# Patient Record
Sex: Female | Born: 1945 | Race: Black or African American | Hispanic: No | Marital: Married | Smoking: Former smoker
Health system: Southern US, Community
[De-identification: ages and names within clinical notes are randomized; demographics above are authoritative.]

## PROBLEM LIST (undated history)

## (undated) DIAGNOSIS — I2510 Atherosclerotic heart disease of native coronary artery without angina pectoris: Secondary | ICD-10-CM

## (undated) DIAGNOSIS — E119 Type 2 diabetes mellitus without complications: Secondary | ICD-10-CM

## (undated) DIAGNOSIS — D571 Sickle-cell disease without crisis: Secondary | ICD-10-CM

## (undated) DIAGNOSIS — N289 Disorder of kidney and ureter, unspecified: Secondary | ICD-10-CM

## (undated) DIAGNOSIS — I10 Essential (primary) hypertension: Secondary | ICD-10-CM

## (undated) DIAGNOSIS — H409 Unspecified glaucoma: Secondary | ICD-10-CM

## (undated) DIAGNOSIS — E039 Hypothyroidism, unspecified: Secondary | ICD-10-CM

## (undated) HISTORY — PX: RETINAL DETACHMENT SURGERY: SHX105

## (undated) HISTORY — PX: HIP SURGERY: SHX245

## (undated) HISTORY — PX: CHOLECYSTECTOMY: SHX55

## (undated) HISTORY — PX: REFRACTIVE SURGERY: SHX103

---

## 1998-05-02 ENCOUNTER — Other Ambulatory Visit: Admission: RE | Admit: 1998-05-02 | Discharge: 1998-05-02 | Payer: Self-pay | Admitting: Internal Medicine

## 1998-07-01 ENCOUNTER — Emergency Department (HOSPITAL_COMMUNITY): Admission: EM | Admit: 1998-07-01 | Discharge: 1998-07-01 | Payer: Self-pay | Admitting: Emergency Medicine

## 2000-08-05 ENCOUNTER — Encounter: Payer: Self-pay | Admitting: Ophthalmology

## 2000-08-06 ENCOUNTER — Ambulatory Visit (HOSPITAL_COMMUNITY): Admission: RE | Admit: 2000-08-06 | Discharge: 2000-08-06 | Payer: Self-pay | Admitting: Ophthalmology

## 2000-10-30 ENCOUNTER — Ambulatory Visit (HOSPITAL_COMMUNITY): Admission: RE | Admit: 2000-10-30 | Discharge: 2000-10-30 | Payer: Self-pay | Admitting: Ophthalmology

## 2001-12-12 ENCOUNTER — Emergency Department (HOSPITAL_COMMUNITY): Admission: EM | Admit: 2001-12-12 | Discharge: 2001-12-12 | Payer: Self-pay | Admitting: Emergency Medicine

## 2002-12-10 ENCOUNTER — Encounter: Payer: Self-pay | Admitting: Emergency Medicine

## 2002-12-10 ENCOUNTER — Emergency Department (HOSPITAL_COMMUNITY): Admission: EM | Admit: 2002-12-10 | Discharge: 2002-12-10 | Payer: Self-pay | Admitting: Emergency Medicine

## 2004-05-14 ENCOUNTER — Inpatient Hospital Stay (HOSPITAL_COMMUNITY): Admission: EM | Admit: 2004-05-14 | Discharge: 2004-05-21 | Payer: Self-pay | Admitting: Emergency Medicine

## 2004-08-16 ENCOUNTER — Encounter: Admission: RE | Admit: 2004-08-16 | Discharge: 2004-08-16 | Payer: Self-pay | Admitting: Pulmonary Disease

## 2004-08-27 ENCOUNTER — Ambulatory Visit (HOSPITAL_COMMUNITY): Admission: RE | Admit: 2004-08-27 | Discharge: 2004-08-27 | Payer: Self-pay | Admitting: Orthopedic Surgery

## 2004-09-12 ENCOUNTER — Inpatient Hospital Stay (HOSPITAL_BASED_OUTPATIENT_CLINIC_OR_DEPARTMENT_OTHER): Admission: RE | Admit: 2004-09-12 | Discharge: 2004-09-12 | Payer: Self-pay | Admitting: Cardiology

## 2004-12-31 ENCOUNTER — Inpatient Hospital Stay (HOSPITAL_COMMUNITY): Admission: RE | Admit: 2004-12-31 | Discharge: 2005-01-03 | Payer: Self-pay | Admitting: Orthopedic Surgery

## 2005-11-26 ENCOUNTER — Encounter: Admission: RE | Admit: 2005-11-26 | Discharge: 2005-11-26 | Payer: Self-pay | Admitting: Pulmonary Disease

## 2006-06-24 ENCOUNTER — Ambulatory Visit (HOSPITAL_COMMUNITY): Admission: RE | Admit: 2006-06-24 | Discharge: 2006-06-24 | Payer: Self-pay | Admitting: Orthopedic Surgery

## 2009-06-22 ENCOUNTER — Ambulatory Visit (HOSPITAL_COMMUNITY): Admission: RE | Admit: 2009-06-22 | Discharge: 2009-06-22 | Payer: Self-pay | Admitting: Ophthalmology

## 2009-07-24 ENCOUNTER — Encounter: Admission: RE | Admit: 2009-07-24 | Discharge: 2009-07-24 | Payer: Self-pay | Admitting: Pulmonary Disease

## 2009-07-27 ENCOUNTER — Encounter: Admission: RE | Admit: 2009-07-27 | Discharge: 2009-07-27 | Payer: Self-pay | Admitting: Pulmonary Disease

## 2010-06-06 ENCOUNTER — Ambulatory Visit (HOSPITAL_COMMUNITY): Admission: RE | Admit: 2010-06-06 | Discharge: 2010-06-06 | Payer: Self-pay | Admitting: Ophthalmology

## 2010-07-29 ENCOUNTER — Encounter: Admission: RE | Admit: 2010-07-29 | Discharge: 2010-07-29 | Payer: Self-pay | Admitting: Pulmonary Disease

## 2010-12-23 ENCOUNTER — Encounter: Payer: Self-pay | Admitting: Pulmonary Disease

## 2011-02-16 ENCOUNTER — Inpatient Hospital Stay (HOSPITAL_COMMUNITY)
Admission: EM | Admit: 2011-02-16 | Discharge: 2011-02-23 | DRG: 417 | Disposition: A | Discharge status: Home / Self Care | Payer: Medicare Other | Attending: Internal Medicine | Admitting: Internal Medicine

## 2011-02-16 ENCOUNTER — Emergency Department (HOSPITAL_COMMUNITY): Payer: Medicare Other

## 2011-02-16 DIAGNOSIS — R1011 Right upper quadrant pain: Secondary | ICD-10-CM

## 2011-02-16 DIAGNOSIS — I10 Essential (primary) hypertension: Secondary | ICD-10-CM | POA: Diagnosis present

## 2011-02-16 DIAGNOSIS — E039 Hypothyroidism, unspecified: Secondary | ICD-10-CM | POA: Diagnosis present

## 2011-02-16 DIAGNOSIS — E119 Type 2 diabetes mellitus without complications: Secondary | ICD-10-CM | POA: Diagnosis present

## 2011-02-16 DIAGNOSIS — E785 Hyperlipidemia, unspecified: Secondary | ICD-10-CM | POA: Diagnosis present

## 2011-02-16 DIAGNOSIS — K5900 Constipation, unspecified: Secondary | ICD-10-CM | POA: Diagnosis not present

## 2011-02-16 DIAGNOSIS — R7402 Elevation of levels of lactic acid dehydrogenase (LDH): Secondary | ICD-10-CM | POA: Diagnosis present

## 2011-02-16 DIAGNOSIS — D5700 Hb-SS disease with crisis, unspecified: Secondary | ICD-10-CM | POA: Diagnosis present

## 2011-02-16 DIAGNOSIS — I252 Old myocardial infarction: Secondary | ICD-10-CM

## 2011-02-16 DIAGNOSIS — R7401 Elevation of levels of liver transaminase levels: Secondary | ICD-10-CM | POA: Diagnosis present

## 2011-02-16 DIAGNOSIS — R0902 Hypoxemia: Secondary | ICD-10-CM | POA: Diagnosis not present

## 2011-02-16 DIAGNOSIS — K8010 Calculus of gallbladder with chronic cholecystitis without obstruction: Principal | ICD-10-CM | POA: Diagnosis present

## 2011-02-16 LAB — CBC
HCT: 32.4 % — ABNORMAL LOW (ref 36.0–46.0)
Hemoglobin: 11.4 g/dL — ABNORMAL LOW (ref 12.0–15.0)
MCH: 29.1 pg (ref 26.0–34.0)
MCHC: 35.2 g/dL (ref 30.0–36.0)
MCV: 82.7 fL (ref 78.0–100.0)
Platelets: 226 10*3/uL (ref 150–400)
RBC: 3.92 MIL/uL (ref 3.87–5.11)
RDW: 16.7 % — ABNORMAL HIGH (ref 11.5–15.5)
WBC: 14 10*3/uL — ABNORMAL HIGH (ref 4.0–10.5)

## 2011-02-16 LAB — URINALYSIS, ROUTINE W REFLEX MICROSCOPIC
Bilirubin Urine: NEGATIVE
Glucose, UA: NEGATIVE mg/dL
Ketones, ur: 15 mg/dL — AB
Nitrite: NEGATIVE
Protein, ur: 30 mg/dL — AB
Specific Gravity, Urine: 1.013 (ref 1.005–1.030)
Urobilinogen, UA: 1 mg/dL (ref 0.0–1.0)
pH: 5.5 (ref 5.0–8.0)

## 2011-02-16 LAB — DIFFERENTIAL
Basophils Absolute: 0 10*3/uL (ref 0.0–0.1)
Basophils Relative: 0 % (ref 0–1)
Eosinophils Absolute: 0 10*3/uL (ref 0.0–0.7)
Eosinophils Relative: 0 % (ref 0–5)
Lymphocytes Relative: 11 % — ABNORMAL LOW (ref 12–46)
Lymphs Abs: 1.5 10*3/uL (ref 0.7–4.0)
Monocytes Absolute: 0.5 10*3/uL (ref 0.1–1.0)
Monocytes Relative: 4 % (ref 3–12)
Neutro Abs: 12 10*3/uL — ABNORMAL HIGH (ref 1.7–7.7)
Neutrophils Relative %: 85 % — ABNORMAL HIGH (ref 43–77)

## 2011-02-16 LAB — COMPREHENSIVE METABOLIC PANEL
ALT: 318 U/L — ABNORMAL HIGH (ref 0–35)
AST: 280 U/L — ABNORMAL HIGH (ref 0–37)
Albumin: 4.8 g/dL (ref 3.5–5.2)
Alkaline Phosphatase: 83 U/L (ref 39–117)
BUN: 16 mg/dL (ref 6–23)
CO2: 24 mEq/L (ref 19–32)
Calcium: 9.7 mg/dL (ref 8.4–10.5)
Chloride: 105 mEq/L (ref 96–112)
Creatinine, Ser: 0.91 mg/dL (ref 0.4–1.2)
GFR calc Af Amer: >60 mL/min (ref >60)
GFR calc non Af Amer: >60 mL/min (ref >60)
Glucose, Bld: 133 mg/dL — ABNORMAL HIGH (ref 70–99)
Potassium: 3.9 mEq/L (ref 3.5–5.1)
Sodium: 140 mEq/L (ref 135–145)
Total Bilirubin: 1.7 mg/dL — ABNORMAL HIGH (ref 0.3–1.2)
Total Protein: 8.2 g/dL (ref 6.0–8.3)

## 2011-02-16 LAB — URINE MICROSCOPIC-ADD ON

## 2011-02-16 LAB — POCT CARDIAC MARKERS
CKMB, poc: <1 ng/mL — ABNORMAL LOW (ref 1.0–8.0)
Myoglobin, poc: 94.6 ng/mL (ref 12–200)
Troponin i, poc: <0.05 ng/mL (ref 0.00–0.09)

## 2011-02-16 LAB — LIPASE, BLOOD: Lipase: 26 U/L (ref 11–59)

## 2011-02-16 LAB — GLUCOSE, CAPILLARY: Glucose-Capillary: 138 mg/dL — ABNORMAL HIGH (ref 70–99)

## 2011-02-17 ENCOUNTER — Encounter (HOSPITAL_COMMUNITY): Payer: Self-pay | Admitting: Radiology

## 2011-02-17 ENCOUNTER — Observation Stay (HOSPITAL_COMMUNITY): Payer: Medicare Other

## 2011-02-17 LAB — COMPREHENSIVE METABOLIC PANEL
ALT: 265 U/L — ABNORMAL HIGH (ref 0–35)
AST: 189 U/L — ABNORMAL HIGH (ref 0–37)
Albumin: 4.1 g/dL (ref 3.5–5.2)
Alkaline Phosphatase: 101 U/L (ref 39–117)
BUN: 13 mg/dL (ref 6–23)
CO2: 26 mEq/L (ref 19–32)
Calcium: 9.4 mg/dL (ref 8.4–10.5)
Chloride: 106 mEq/L (ref 96–112)
Creatinine, Ser: 0.97 mg/dL (ref 0.4–1.2)
GFR calc Af Amer: >60 mL/min (ref >60)
GFR calc non Af Amer: 58 mL/min — ABNORMAL LOW (ref >60)
Glucose, Bld: 246 mg/dL — ABNORMAL HIGH (ref 70–99)
Potassium: 4.2 mEq/L (ref 3.5–5.1)
Sodium: 139 mEq/L (ref 135–145)
Total Bilirubin: 1.2 mg/dL (ref 0.3–1.2)
Total Protein: 7.7 g/dL (ref 6.0–8.3)

## 2011-02-17 LAB — CBC
HCT: 30.3 % — ABNORMAL LOW (ref 36.0–46.0)
Hemoglobin: 10.7 g/dL — ABNORMAL LOW (ref 12.0–15.0)
MCH: 29.1 pg (ref 26.0–34.0)
MCHC: 35.3 g/dL (ref 30.0–36.0)
MCV: 82.3 fL (ref 78.0–100.0)
Platelets: 183 10*3/uL (ref 150–400)
RBC: 3.68 MIL/uL — ABNORMAL LOW (ref 3.87–5.11)
RDW: 16.5 % — ABNORMAL HIGH (ref 11.5–15.5)
WBC: 15.4 10*3/uL — ABNORMAL HIGH (ref 4.0–10.5)

## 2011-02-17 LAB — GLUCOSE, CAPILLARY
Glucose-Capillary: 207 mg/dL — ABNORMAL HIGH (ref 70–99)
Glucose-Capillary: 229 mg/dL — ABNORMAL HIGH (ref 70–99)
Glucose-Capillary: 217 mg/dL — ABNORMAL HIGH (ref 70–99)

## 2011-02-17 LAB — CARDIAC PANEL(CRET KIN+CKTOT+MB+TROPI)
CK, MB: 1.3 ng/mL (ref 0.3–4.0)
CK, MB: 1.3 ng/mL (ref 0.3–4.0)
CK, MB: 1.4 ng/mL (ref 0.3–4.0)
Relative Index: INVALID (ref 0.0–2.5)
Relative Index: INVALID (ref 0.0–2.5)
Relative Index: INVALID (ref 0.0–2.5)
Total CK: 55 U/L (ref 7–177)
Total CK: 50 U/L (ref 7–177)
Total CK: 50 U/L (ref 7–177)
Troponin I: 0.01 ng/mL (ref 0.00–0.06)
Troponin I: 0.01 ng/mL (ref 0.00–0.06)
Troponin I: 0.01 ng/mL (ref 0.00–0.06)

## 2011-02-17 LAB — URINE CULTURE: Culture  Setup Time: 201203182353

## 2011-02-17 LAB — TSH: TSH: 1.591 u[IU]/mL (ref 0.350–4.500)

## 2011-02-17 MED ORDER — TECHNETIUM TC 99M MEBROFENIN IV KIT
4.9000 | PACK | Freq: Once | INTRAVENOUS | Status: AC | PRN
  Administered 2011-02-17: 4.9 via INTRAVENOUS

## 2011-02-18 LAB — GLUCOSE, CAPILLARY
Glucose-Capillary: 184 mg/dL — ABNORMAL HIGH (ref 70–99)
Glucose-Capillary: 186 mg/dL — ABNORMAL HIGH (ref 70–99)
Glucose-Capillary: 244 mg/dL — ABNORMAL HIGH (ref 70–99)
Glucose-Capillary: 233 mg/dL — ABNORMAL HIGH (ref 70–99)
Glucose-Capillary: 167 mg/dL — ABNORMAL HIGH (ref 70–99)

## 2011-02-18 LAB — COMPREHENSIVE METABOLIC PANEL
ALT: 222 U/L — ABNORMAL HIGH (ref 0–35)
AST: 112 U/L — ABNORMAL HIGH (ref 0–37)
Albumin: 3.7 g/dL (ref 3.5–5.2)
Alkaline Phosphatase: 122 U/L — ABNORMAL HIGH (ref 39–117)
BUN: 10 mg/dL (ref 6–23)
CO2: 27 mEq/L (ref 19–32)
Calcium: 8.9 mg/dL (ref 8.4–10.5)
Chloride: 103 mEq/L (ref 96–112)
Creatinine, Ser: 0.92 mg/dL (ref 0.4–1.2)
GFR calc Af Amer: >60 mL/min (ref >60)
GFR calc non Af Amer: >60 mL/min (ref >60)
Glucose, Bld: 176 mg/dL — ABNORMAL HIGH (ref 70–99)
Potassium: 4 mEq/L (ref 3.5–5.1)
Sodium: 136 mEq/L (ref 135–145)
Total Bilirubin: 1.4 mg/dL — ABNORMAL HIGH (ref 0.3–1.2)
Total Protein: 7 g/dL (ref 6.0–8.3)

## 2011-02-18 LAB — CBC
HCT: 31.1 % — ABNORMAL LOW (ref 36.0–46.0)
Hemoglobin: 10.8 g/dL — ABNORMAL LOW (ref 12.0–15.0)
MCH: 28.8 pg (ref 26.0–34.0)
MCHC: 34.7 g/dL (ref 30.0–36.0)
MCV: 82.9 fL (ref 78.0–100.0)
Platelets: 172 10*3/uL (ref 150–400)
RBC: 3.75 MIL/uL — ABNORMAL LOW (ref 3.87–5.11)
RDW: 16.7 % — ABNORMAL HIGH (ref 11.5–15.5)
WBC: 16.2 10*3/uL — ABNORMAL HIGH (ref 4.0–10.5)

## 2011-02-18 LAB — RETICULOCYTES
RBC.: 3.75 MIL/uL — ABNORMAL LOW (ref 3.87–5.11)
Retic Count, Absolute: 176.3 10*3/uL (ref 19.0–186.0)
Retic Ct Pct: 4.7 % — ABNORMAL HIGH (ref 0.4–3.1)

## 2011-02-19 ENCOUNTER — Other Ambulatory Visit: Payer: Self-pay | Admitting: Emergency Medicine

## 2011-02-19 ENCOUNTER — Observation Stay (HOSPITAL_COMMUNITY): Payer: Medicare Other

## 2011-02-19 LAB — COMPREHENSIVE METABOLIC PANEL
ALT: 141 U/L — ABNORMAL HIGH (ref 0–35)
AST: 51 U/L — ABNORMAL HIGH (ref 0–37)
Albumin: 3.4 g/dL — ABNORMAL LOW (ref 3.5–5.2)
Alkaline Phosphatase: 104 U/L (ref 39–117)
BUN: 9 mg/dL (ref 6–23)
CO2: 27 mEq/L (ref 19–32)
Calcium: 8.5 mg/dL (ref 8.4–10.5)
Chloride: 102 mEq/L (ref 96–112)
Creatinine, Ser: 0.98 mg/dL (ref 0.4–1.2)
GFR calc Af Amer: >60 mL/min (ref >60)
GFR calc non Af Amer: 58 mL/min — ABNORMAL LOW (ref >60)
Glucose, Bld: 179 mg/dL — ABNORMAL HIGH (ref 70–99)
Potassium: 3.7 mEq/L (ref 3.5–5.1)
Sodium: 136 mEq/L (ref 135–145)
Total Bilirubin: 1.5 mg/dL — ABNORMAL HIGH (ref 0.3–1.2)
Total Protein: 6.5 g/dL (ref 6.0–8.3)

## 2011-02-19 LAB — GLUCOSE, CAPILLARY
Glucose-Capillary: 183 mg/dL — ABNORMAL HIGH (ref 70–99)
Glucose-Capillary: 214 mg/dL — ABNORMAL HIGH (ref 70–99)
Glucose-Capillary: 201 mg/dL — ABNORMAL HIGH (ref 70–99)

## 2011-02-19 LAB — HEPATITIS PANEL, ACUTE
HCV Ab: NEGATIVE
Hep A IgM: NEGATIVE
Hep B C IgM: NEGATIVE
Hepatitis B Surface Ag: NEGATIVE

## 2011-02-19 LAB — CBC
HCT: 29.3 % — ABNORMAL LOW (ref 36.0–46.0)
Hemoglobin: 10.1 g/dL — ABNORMAL LOW (ref 12.0–15.0)
MCH: 28.4 pg (ref 26.0–34.0)
MCHC: 34.5 g/dL (ref 30.0–36.0)
MCV: 82.3 fL (ref 78.0–100.0)
Platelets: 149 10*3/uL — ABNORMAL LOW (ref 150–400)
RBC: 3.56 MIL/uL — ABNORMAL LOW (ref 3.87–5.11)
RDW: 16.7 % — ABNORMAL HIGH (ref 11.5–15.5)
WBC: 15 10*3/uL — ABNORMAL HIGH (ref 4.0–10.5)

## 2011-02-20 LAB — HEMOGLOBIN A1C
Hgb A1c MFr Bld: 4.5 % — ABNORMAL LOW (ref <5.7)
Mean Plasma Glucose: 82 mg/dL (ref <117)

## 2011-02-20 LAB — DIFFERENTIAL
Basophils Absolute: 0 10*3/uL (ref 0.0–0.1)
Basophils Relative: 0 % (ref 0–1)
Eosinophils Absolute: 0.1 10*3/uL (ref 0.0–0.7)
Eosinophils Relative: 1 % (ref 0–5)
Lymphocytes Relative: 17 % (ref 12–46)
Lymphs Abs: 2 10*3/uL (ref 0.7–4.0)
Monocytes Absolute: 1.3 10*3/uL — ABNORMAL HIGH (ref 0.1–1.0)
Monocytes Relative: 11 % (ref 3–12)
Neutro Abs: 8.1 10*3/uL — ABNORMAL HIGH (ref 1.7–7.7)
Neutrophils Relative %: 70 % (ref 43–77)

## 2011-02-20 LAB — CBC
HCT: 28.3 % — ABNORMAL LOW (ref 36.0–46.0)
Hemoglobin: 9.7 g/dL — ABNORMAL LOW (ref 12.0–15.0)
MCH: 28.2 pg (ref 26.0–34.0)
MCHC: 34.3 g/dL (ref 30.0–36.0)
MCV: 82.3 fL (ref 78.0–100.0)
Platelets: 152 10*3/uL (ref 150–400)
RBC: 3.44 MIL/uL — ABNORMAL LOW (ref 3.87–5.11)
RDW: 16.7 % — ABNORMAL HIGH (ref 11.5–15.5)
WBC: 11.5 10*3/uL — ABNORMAL HIGH (ref 4.0–10.5)

## 2011-02-20 LAB — GLUCOSE, CAPILLARY
Glucose-Capillary: 153 mg/dL — ABNORMAL HIGH (ref 70–99)
Glucose-Capillary: 131 mg/dL — ABNORMAL HIGH (ref 70–99)
Glucose-Capillary: 126 mg/dL — ABNORMAL HIGH (ref 70–99)
Glucose-Capillary: 261 mg/dL — ABNORMAL HIGH (ref 70–99)
Glucose-Capillary: 124 mg/dL — ABNORMAL HIGH (ref 70–99)
Glucose-Capillary: 167 mg/dL — ABNORMAL HIGH (ref 70–99)

## 2011-02-21 ENCOUNTER — Inpatient Hospital Stay (HOSPITAL_COMMUNITY): Payer: Medicare Other

## 2011-02-21 LAB — COMPREHENSIVE METABOLIC PANEL
ALT: 123 U/L — ABNORMAL HIGH (ref 0–35)
AST: 67 U/L — ABNORMAL HIGH (ref 0–37)
Albumin: 3.1 g/dL — ABNORMAL LOW (ref 3.5–5.2)
Alkaline Phosphatase: 105 U/L (ref 39–117)
BUN: 7 mg/dL (ref 6–23)
CO2: 27 mEq/L (ref 19–32)
Calcium: 8.3 mg/dL — ABNORMAL LOW (ref 8.4–10.5)
Chloride: 105 mEq/L (ref 96–112)
Creatinine, Ser: 0.92 mg/dL (ref 0.4–1.2)
GFR calc Af Amer: >60 mL/min (ref >60)
GFR calc non Af Amer: >60 mL/min (ref >60)
Glucose, Bld: 137 mg/dL — ABNORMAL HIGH (ref 70–99)
Potassium: 3.7 mEq/L (ref 3.5–5.1)
Sodium: 140 mEq/L (ref 135–145)
Total Bilirubin: 1.9 mg/dL — ABNORMAL HIGH (ref 0.3–1.2)
Total Protein: 6.4 g/dL (ref 6.0–8.3)

## 2011-02-21 LAB — URINALYSIS, ROUTINE W REFLEX MICROSCOPIC
Bilirubin Urine: NEGATIVE
Glucose, UA: NEGATIVE mg/dL
Hgb urine dipstick: NEGATIVE
Ketones, ur: NEGATIVE mg/dL
Nitrite: NEGATIVE
Protein, ur: NEGATIVE mg/dL
Specific Gravity, Urine: 1.009 (ref 1.005–1.030)
Urobilinogen, UA: 1 mg/dL (ref 0.0–1.0)
pH: 6 (ref 5.0–8.0)

## 2011-02-21 LAB — GLUCOSE, CAPILLARY
Glucose-Capillary: 133 mg/dL — ABNORMAL HIGH (ref 70–99)
Glucose-Capillary: 225 mg/dL — ABNORMAL HIGH (ref 70–99)
Glucose-Capillary: 152 mg/dL — ABNORMAL HIGH (ref 70–99)

## 2011-02-21 LAB — CBC
HCT: 26.9 % — ABNORMAL LOW (ref 36.0–46.0)
Hemoglobin: 9.2 g/dL — ABNORMAL LOW (ref 12.0–15.0)
MCH: 28.6 pg (ref 26.0–34.0)
MCHC: 34.2 g/dL (ref 30.0–36.0)
MCV: 83.5 fL (ref 78.0–100.0)
Platelets: 155 10*3/uL (ref 150–400)
RBC: 3.22 MIL/uL — ABNORMAL LOW (ref 3.87–5.11)
RDW: 17.1 % — ABNORMAL HIGH (ref 11.5–15.5)
WBC: 13.9 10*3/uL — ABNORMAL HIGH (ref 4.0–10.5)

## 2011-02-21 LAB — DIFFERENTIAL
Basophils Absolute: 0 10*3/uL (ref 0.0–0.1)
Basophils Relative: 0 % (ref 0–1)
Eosinophils Absolute: 0.3 10*3/uL (ref 0.0–0.7)
Eosinophils Relative: 2 % (ref 0–5)
Lymphocytes Relative: 23 % (ref 12–46)
Lymphs Abs: 3.1 10*3/uL (ref 0.7–4.0)
Monocytes Absolute: 1.5 10*3/uL — ABNORMAL HIGH (ref 0.1–1.0)
Monocytes Relative: 11 % (ref 3–12)
Neutro Abs: 9 10*3/uL — ABNORMAL HIGH (ref 1.7–7.7)
Neutrophils Relative %: 64 % (ref 43–77)

## 2011-02-22 LAB — GLUCOSE, CAPILLARY
Glucose-Capillary: 148 mg/dL — ABNORMAL HIGH (ref 70–99)
Glucose-Capillary: 130 mg/dL — ABNORMAL HIGH (ref 70–99)

## 2011-02-22 LAB — CBC
HCT: 26.6 % — ABNORMAL LOW (ref 36.0–46.0)
Hemoglobin: 9 g/dL — ABNORMAL LOW (ref 12.0–15.0)
MCH: 28 pg (ref 26.0–34.0)
MCHC: 33.8 g/dL (ref 30.0–36.0)
MCV: 82.6 fL (ref 78.0–100.0)
Platelets: ADEQUATE 10*3/uL (ref 150–400)
RBC: 3.22 MIL/uL — ABNORMAL LOW (ref 3.87–5.11)
RDW: 16.9 % — ABNORMAL HIGH (ref 11.5–15.5)
WBC: 14 10*3/uL — ABNORMAL HIGH (ref 4.0–10.5)

## 2011-02-22 LAB — TSH: TSH: 0.814 u[IU]/mL (ref 0.350–4.500)

## 2011-02-22 LAB — DIFFERENTIAL
Basophils Absolute: 0 10*3/uL (ref 0.0–0.1)
Basophils Relative: 0 % (ref 0–1)
Eosinophils Absolute: 0.3 10*3/uL (ref 0.0–0.7)
Eosinophils Relative: 2 % (ref 0–5)
Lymphocytes Relative: 19 % (ref 12–46)
Lymphs Abs: 2.7 10*3/uL (ref 0.7–4.0)
Monocytes Absolute: 1.4 10*3/uL — ABNORMAL HIGH (ref 0.1–1.0)
Monocytes Relative: 10 % (ref 3–12)
Neutro Abs: 9.6 10*3/uL — ABNORMAL HIGH (ref 1.7–7.7)
Neutrophils Relative %: 69 % (ref 43–77)

## 2011-02-23 LAB — DIFFERENTIAL
Basophils Absolute: 0 10*3/uL (ref 0.0–0.1)
Basophils Relative: 0 % (ref 0–1)
Eosinophils Absolute: 0.2 10*3/uL (ref 0.0–0.7)
Eosinophils Relative: 2 % (ref 0–5)
Lymphocytes Relative: 23 % (ref 12–46)
Lymphs Abs: 2.6 10*3/uL (ref 0.7–4.0)
Monocytes Absolute: 1 10*3/uL (ref 0.1–1.0)
Monocytes Relative: 9 % (ref 3–12)
Neutro Abs: 7.4 10*3/uL (ref 1.7–7.7)
Neutrophils Relative %: 66 % (ref 43–77)

## 2011-02-23 LAB — GLUCOSE, CAPILLARY: Glucose-Capillary: 146 mg/dL — ABNORMAL HIGH (ref 70–99)

## 2011-02-23 LAB — CBC
HCT: 25.5 % — ABNORMAL LOW (ref 36.0–46.0)
Hemoglobin: 8.7 g/dL — ABNORMAL LOW (ref 12.0–15.0)
MCH: 28.4 pg (ref 26.0–34.0)
MCHC: 34.1 g/dL (ref 30.0–36.0)
MCV: 83.3 fL (ref 78.0–100.0)
Platelets: 187 10*3/uL (ref 150–400)
RBC: 3.06 MIL/uL — ABNORMAL LOW (ref 3.87–5.11)
RDW: 17.1 % — ABNORMAL HIGH (ref 11.5–15.5)
WBC: 11.2 10*3/uL — ABNORMAL HIGH (ref 4.0–10.5)

## 2011-02-27 LAB — GLUCOSE, CAPILLARY
Glucose-Capillary: 152 mg/dL — ABNORMAL HIGH (ref 70–99)
Glucose-Capillary: 114 mg/dL — ABNORMAL HIGH (ref 70–99)
Glucose-Capillary: 163 mg/dL — ABNORMAL HIGH (ref 70–99)
Glucose-Capillary: 200 mg/dL — ABNORMAL HIGH (ref 70–99)

## 2011-03-02 NOTE — H&P (Signed)
NAMEANIYA, MILFORD                 ACCOUNT NO.:  [id number]  MEDICAL RECORD NO.:  [id number]           PATIENT TYPE:  E  LOCATION:  WLED                         FACILITY:  [facility]  PHYSICIAN:  Geraldine Solar, MD     DATE OF BIRTH:  03/03/1946  DATE OF ADMISSION:  02/16/2011 DATE OF DISCHARGE:                             HISTORY & PHYSICAL   PRIMARY CARE PHYSICIAN:  Ernestene Kiel, M.D.  CODE STATUS:  The patient is a full code.  HISTORY OF PRESENT ILLNESS:  Ms. Kumba Diego is a 65 year old African American pleasant woman with history of sickle cell disease and other comorbidities as outlined below, presented to the ER today with chief complaint of chest pain, joints pain including upper and lower extremities joints, back pain similar to her previous sickle cell crisis.  She was also complaining of nonspecific abdominal pain, which is currently resolved.  She vomited twice and feeling nauseated.  Denies any diarrhea, constipation, or any change in her bowel movement.  The patient described her pain as dull aching and sometimes sharp pain rated at 10/10 on presentation, currently improved after she took pain medicine in the ER, now around 4 to 5 out of 10, diffuse pain including all her joints, back, and chest.  PAST MEDICAL HISTORY: 1. Sickle cell disease/anemia. 2. Hypertension. 3. Diabetes mellitus, type 2. 4. History of MI/coronary artery disease. 5. Degenerative joint disease. 6. Bone graft, left femoral head for aseptic necrosis in 2006. 7. History of angina. 8. Hyperlipidemia. 9. Hypothyroidism.  SOCIAL HISTORY:  Lives with husband.  Denies smoking, drinking, or illicit drug use.  ALLERGIES:  No known drug allergies.  HOME MEDICATIONS: 1. Altace 10 mg twice daily. 2. Synthroid 75 mcg once daily. 3. Azor 10/40 mg once daily. 4. Lipitor 40 mg once daily. 5. Glucotrol XL 5 mg once daily. 6. Glucophage XR 500 mg twice daily. 7. Folic acid 1 mg once daily. 8.  Aspirin 81 mg once daily. 9. Calcium tablets once a day. 10.Celebrex 200 mg twice daily.  REVIEW OF SYSTEMS:  As above in HPI.  Complaining of chest pain.  Denies shortness of breath or cough.  CARDIOVASCULAR:  Chest pain.  No palpitation.  No dizziness.  ABDOMEN:  Significant for abdominal pain, which currently resolved associated with nausea and vomiting x2.  No change in bowel habits.  GU:  Denies any dysuria, flank pain, hematuria, or increasing urinary frequency.  CONSTITUTIONAL:  Denies any fever or chills.  FAMILY HISTORY:  No family history of sickle cell disease/anemia in her sibling or her family from both sides.  PHYSICAL EXAMINATION:  VITAL SIGNS:  Blood pressure 181/72, pulse 90, temperature 99, O2 sat 96% on oxygen. GENERAL:  She is alert, in moderate distress. NECK:  Supple.  No JVD. LUNGS:  Clear to auscultation bilaterally. CARDIOVASCULAR:  S1, S2, regular rhythm and rate. ABDOMEN:  Soft, nontender.  Bowel sounds normal. EXTREMITIES:  Show no pedal edema.  LABORATORY DATA:  Urinalysis showed small leukocytes, 3-6 wbc's, negative nitrite.  Cardiac enzymes, troponin is less than 0.05, lipase 26.  Potassium 3.9, glucose 133, creatinine 0.9,  bilirubin total 1.7, AST 280, ALT 318, albumin 4.8, calcium 9.7.  CBC, WBCs 14, hemoglobin 11.4, hematocrit 32.4, platelet count 226, neutrophil percent 85%.  RADIOLOGY:  Abdominal sonogram showed a wall-echo-shadow complex. Differential consideration include a stone-filled contracted gallbladder or calcified gallbladder wall.  No evidence of acute cholecystitis.  No biliary ductal dilatation.  Probable hepatic steatosis.  Chest x-ray showed no active cardiopulmonary disease.  ASSESSMENT/PLAN:  Ms. Cathy Ravert is a 65 year old woman with history of sickle cell disease presenting with sickle cell crisis and transaminitis. 1. Sickle cell crisis.  We will start the patient on IV fluid for     hydration.  Pain control.  Continue  with folic acid.  Check LDH and     retic count. 2. Transaminitis with no evidence of acute cholecystitis as per     sonogram as above.  We will order for a HIDA scan. 3. Diabetes mellitus, type 2.  Given the poor oral intake, I will hold     her oral hypoglycemic agents and place her on sliding scale for     now. 4. Hypertension, uncontrolled.  Continue home medications and adjust     for optimal control. 5. Hypothyroidism.  Continue with Synthroid.  Check TSH level. 6. Deep vein thrombosis and gastrointestinal prophylaxis. 7. The patient is a full code.          ______________________________ Geraldine Solar, MD     SA/MEDQ  D:  02/16/2011  T:  02/16/2011  Job:  [id number]  cc:   Ernestene Kiel, M.D. [phone]  Electronically Signed by Shellee Milo MD on 03/02/2011 08:44:31 PM

## 2011-03-09 LAB — URINE MICROSCOPIC-ADD ON

## 2011-03-09 LAB — CBC
HCT: 32.4 % — ABNORMAL LOW (ref 36.0–46.0)
Hemoglobin: 10.9 g/dL — ABNORMAL LOW (ref 12.0–15.0)
MCHC: 33.7 g/dL (ref 30.0–36.0)
MCV: 89.5 fL (ref 78.0–100.0)
Platelets: 307 10*3/uL (ref 150–400)
RBC: 3.63 MIL/uL — ABNORMAL LOW (ref 3.87–5.11)
RDW: 17.7 % — ABNORMAL HIGH (ref 11.5–15.5)
WBC: 10.1 10*3/uL (ref 4.0–10.5)

## 2011-03-09 LAB — URINALYSIS, ROUTINE W REFLEX MICROSCOPIC
Bilirubin Urine: NEGATIVE
Glucose, UA: NEGATIVE mg/dL
Hgb urine dipstick: NEGATIVE
Ketones, ur: NEGATIVE mg/dL
Nitrite: NEGATIVE
Protein, ur: NEGATIVE mg/dL
Specific Gravity, Urine: 1.013 (ref 1.005–1.030)
Urobilinogen, UA: 1 mg/dL (ref 0.0–1.0)
pH: 6 (ref 5.0–8.0)

## 2011-03-09 LAB — BASIC METABOLIC PANEL
BUN: 13 mg/dL (ref 6–23)
CO2: 26 mEq/L (ref 19–32)
Calcium: 9.9 mg/dL (ref 8.4–10.5)
Chloride: 108 mEq/L (ref 96–112)
Creatinine, Ser: 0.99 mg/dL (ref 0.4–1.2)
GFR calc Af Amer: >60 mL/min (ref >60)
GFR calc non Af Amer: 57 mL/min — ABNORMAL LOW (ref >60)
Glucose, Bld: 96 mg/dL (ref 70–99)
Potassium: 5.1 mEq/L (ref 3.5–5.1)
Sodium: 143 mEq/L (ref 135–145)

## 2011-03-09 LAB — GLUCOSE, CAPILLARY: Glucose-Capillary: 97 mg/dL (ref 70–99)

## 2011-03-11 NOTE — Discharge Summary (Signed)
[name], [name]                 ACCOUNT NO.:  [id number]  MEDICAL RECORD NO.:  [id number]           PATIENT TYPE:  I  LOCATION:  1401                         FACILITY:  [place]:  Leana Gamer, MDDATE OF BIRTH:  June 05, 1946  DATE OF ADMISSION:  02/16/2011 DATE OF DISCHARGE:  02/23/2011                              DISCHARGE SUMMARY   DISCHARGE DISPOSITION:  Home.  FINAL DISCHARGE DIAGNOSES: 1. Acute cholecystitis, status post laparoscopic cholecystectomy. 2. Hemoglobin [place] with acute vasoocclusive episode, now resolved. 3. Leukocytosis secondary to demargination, resolved. 4. Hypoxemia, resolved. 5. 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 6. Diabetes type 2. 7. Constipation, resolved. 8. Hypertension, well controlled. 9. History of myocardial infarction/coronary artery disease, stable. 10.Status post bone graft, left femoral head, aseptic necrosis in     2006. 11.Hyperlipidemia. 12.Hypothyroidism.  Discharge medications include the following: 1. Vicodin 5/325 mg 1-2 tablets p.o. q.4 h. p.r.n. pain. 2. MiraLax 17 g in 8 ounces of fluid daily as needed for constipation. 3. Altace 10 mg p.o. b.i.d. 4. Aspirin 81 mg p.o. daily. 5. Azor 10/40 mg p.o. daily. 6. Timolol ophthalmic solution 1 drop in each eye twice a day. 7. Celebrex 200 mg p.o. b.i.d. 8. Folic acid 1 mg p.o. daily. 9. Glucotrol XL 5 mg p.o. daily. 10.Lantus 12 units subcutaneously nightly. 11.Lipitor 40 mg p.o. daily. 12.Metformin XR 500 mg p.o. b.i.d. 13.Restasis ophthalmic solution 1 drop each eye daily at bedtime. 14.Synthroid 75 mcg p.o. daily. 15.Travatan eyed rops 1 drop each eye b.i.d.  CONSULTANTS:  Amber L. Zenia Resides, MD, General Surgery.  PROCEDURES:  Laparoscopic cholecystectomy with cholangiogram intraoperatively done on February 19, 2011.  DIAGNOSTIC STUDIES: 1. Two-view chest x-ray on admission, which shows no active     cardiopulmonary disease. 2. Ultrasound of the  abdomen, which shows no evidence of acute     cholecystitis, no biliary ductal dilatation.  Impression:  Probable     hepatic steatosis. 3. HIDA scan, which showed delayed filling of the gallbladder after     morphine sulfate injection and indicating patency of the cystic     duct.  The gallbladder is small, suggesting chronic cholecystitis.     Common bile duct patent. 4. Two-view chest x-ray on the 23rd, which shows low lung volumes with     bibasilar atelectasis with small effusions.  PRIMARY CARE PHYSICIAN:  Ernestene Kiel, M.D.  CHIEF COMPLAINT:  Chest pain, joint pain.  HISTORY OF PRESENT ILLNESS:  Please refer to the H and P by Dr. Melrose Nakayama for details of the HPI; however, in short this is a 65 year old female with a history of hemoglobin [place] who presents to the emergency with complaints of polymyalgia.  This pain was consistent with her vasoocclusive episodes associated with hemoglobin [place].  The patient also complained of right upper quadrant pain and referred to [facility] for further evaluation and management. 1. Chronic subacute cholecystitis.  The patient was evaluated by     General Surgery with studies as above.  The patient was taken to     surgery and laparoscopic cholecystectomy performed.  The patient  tolerated the procedure well.  She was started on clear liquids and     advanced to a diabetic diet, which she is tolerating without any     difficulty. 2. Vasoocclusive episode associated with hemoglobin [place].  The patient     has rare vasoocclusive episode; however, this was likely was     precipitated by her acute condition involving her gallbladder.  The     patient postsurgically required some narcotics to control her pain;     however, the patient quickly weaned off her narcotics and has     required very minimal amounts of pain medication at this time. 3. Hypoxemia.  The patient had some postoperative hypoxemia, which was     felt to be associated  was probably narcotic use in addition to some     fluid overload.  The patient has been weaned off her oxygen and was     doing well.  Her sats are  between 93% and 95%, ambulating on room     air. 4. Diabetes type 2.  The patient has good control on her diabetes.     She is resumed on her Lantus, Glucophage, and Glucotrol, which she     is tolerated well. 5. Obstipation.  The patient had difficulty with obstipation, which     prolonged her hospital stay.  However, on last evening, the patient     had a good bowel movement after laxatives.  She has been placed on     stool softeners and she has been advised to take MiraLax on a daily     basis.  However, the patient would like substitute prune juice for     that, which I advised that she should do, but counted into her     carbs as related to her diabetes.  All other medical problems were     quiescent during this hospitalization.  At the time of discharge,     the patient is stable.  PHYSICAL EXAMINATION:  GENERAL:  The patient is well appearing. VITAL SIGNS:  Temperature is 98.3, heart rate 88, respiratory rate 18, O2 sats are 90% to 93% on room air, blood pressure 150/79. HEENT:  She is normocephalic, atraumatic.  Pupils are equally round and reactive to light and accommodation.  Extraocular movements are intact. Oropharynx is moist.  No exudate, erythema, or lesions are noted. NECK:  Trachea is midline.  No masses, no thyromegaly, no JVD, no carotid bruit. LUNGS:  Clear to auscultation.  No wheezing, no rhonchi noted. CARDIOVASCULAR:  She has got a normal S1 and S2.  No murmurs, rubs, or gallops are noted.  PMI is nondisplaced.  No heaves or thrills on palpation. ABDOMEN:  Obese, soft, nontender, nondistended.  No masses.  No hepatosplenomegaly. EXTREMITIES:  No clubbing, cyanosis, or edema. PSYCHIATRIC:  She is alert and oriented x3.  DIETARY RESTRICTIONS:  The patient should be on a heart-healthy diabetic diet.  PHYSICAL  RESTRICTIONS:  None.  FOLLOWUP:  The patient is to follow up with the clinic on March 04, 2011, and follow up with her primary care physician, Dr. Katherine Roan within 1 week.     Leana Gamer, MD     MAM/MEDQ  D:  02/23/2011  T:  02/24/2011  Job:  [id number]  cc:   Ernestene Kiel, M.D. [phone]  Electronically Signed by Liston Alba MD on 03/11/2011 08:31:39 PM

## 2011-03-12 NOTE — Consult Note (Signed)
[name], [name]                 ACCOUNT NO.:  [id number]  MEDICAL RECORD NO.:  [id number]           PATIENT TYPE:  O  LOCATION:  1401                         FACILITY:  [facility]  PHYSICIAN:  Mare Loan, MD      DATE OF BIRTH:  1945/12/10  DATE OF CONSULTATION:  02/18/2011 DATE OF DISCHARGE:                                CONSULTATION   REFERRING PHYSICIAN:  Jacquelynn Cree, M.D.  PRIMARY CARE PHYSICIAN:  Ernestene Kiel, M.D.  CHIEF COMPLAINT:  Chest and joint pain.  BRIEF HISTORY:  The patient is a 65 year old African American female who presented to the ER on February 16, 2011 complaining of joint and chest pain.  She has a history of sickle cell and came in with some Sickle Cell crisis but also complained of upper chest pain on both sides.  It started the night before she had some nausea and vomiting after eating chicken wings on February 15, 2011.  She came back to [facility] because pain continued, came to the ER.  She was admitted to rule out angina and to treat her sickle cell crisis.  She has had sickle crises before but the pain under her breast both in left and right was new.  She does have a history of chest pain back in 2005 with MI and a essentially normal heart cath.  PAST MEDICAL HISTORY: 1. Sickle cell disease/anemia. 2. Hypertension. 3. New-onset diabetes mellitus. 4. MI/coronary artery disease in 2005. 5. Hypothyroid. 6. Degenerative joint disease.  PAST SURGICAL HISTORY: 1. Left femoral head bone grafting for aseptic necrosis with core biopsy and bone graft in left femoral head with bone grafting for the left trochanter December 31, 2004. 2. Cardiac catheterization, September 12, 2004 which showed noncritical coronary artery disease, left main was 10%, LAD was 30%, the D1, D2, OM2 and OM3 were all small.  RCA was patent with noncritical coronary artery disease.  EF was 60-65%. 3. Trigger finger release. 4. Cataracts.  FAMILY HISTORY:  Mother is living  but has dementia, high blood pressure, diabetes at 63.  Father died of prostate cancer and diabetes, probably hypertension.  She has 3 brothers with diabetes, one has had toe amputation secondary to peripheral vascular occlusive disease and one with prostate disease.  No sisters.  SOCIAL HISTORY:  She smoked for about 30 years, less than a pack a day. None for the last 10-12 years.  Social drinker in the past, none for 12 years. Drugs none.  She worked as a Orthoptist at [employer].  She is married.  REVIEW OF SYSTEMS:  FEVER:  None.  SKIN:  No changes.  WEIGHT:  No changes.  PSYCH:  No changes.  CEREBROVASCULAR:  Occasional dizziness. PULMONARY:  No orthopnea.  No PND.  No dyspnea on exertion.  No coughing or wheezing.  GI:  Negative for GERD.  Positive for nausea and vomiting, started on Saturday.  Positive for constipation since admission.  No blood in her stool.  GU:  No trouble voiding.  Stream is a bit slow. LOWER EXTREMITIES:  No claudication.  No edema.  MUSCULOSKELETAL:  She has joint pain secondary to her sickle cell disease.  OB/GYN:  She is postmenopausal.  CURRENT MEDICATIONS: 1. Altace 10 mg daily. 2. Synthroid 75 mcg daily. 3. Azor 10/40 one daily. 4. Lipitor 40 mg daily. 5. Glucotrol 5 mg daily. 6. Glucophage [id number] mg b.i.d. 7. Folic acid 1 daily. 8. Aspirin 81 mg daily. 9. Calcium 2 daily. 10.Celebrex 200 mg b.i.d..  Current medications, in the hospital were the above plus Zosyn.  She is also on Lantus insulin 14 units daily.  ALLERGIES:  None.  PHYSICAL EXAMINATION:  GENERAL:  This is a well-nourished, well- developed African American female, currently in no acute distress and she is comfortable from her first time that she has been in the hospital. VITAL SIGNS:  Weight is 69.5 kg.  Height is 63 inches.  Temperature 98.5, heart rate 106, blood pressure 122/72, respiratory rate is 16, sats are 93% on 2 L. HEAD:  Normocephalic. EYES, EARS, NOSE AND  THROAT:  Within normal limits.  Dentition is good repair. NECK:  No bruits.  No JVD.  No thyromegaly. CHEST:  Clear to auscultation percussion, nontender to palpation. CARDIAC:  Normal S1 and S2. ABDOMEN:  Soft, nontender, positive bowel sounds.  No palpable hepatosplenomegaly. GU/RECTAL:  Deferred. LYMPHADENOPATHY:  None palpated. MUSCULOSKELETAL:  Normal joints and muscle tone. SKIN:  No changes. NEUROLOGIC:  No focal changes.  Cranial nerves grossly normal. PSYCH:  Normal affect.  LABORATORY DATA:  Sodium is 136, potassium is 4, chloride is 103, CO2 is 27, BUN 10, creatinine 0.92, glucose 176, total bilirubin 1.4, alk phos 122, SGOT 112, SGPT 222.  White count is 16.2, hemoglobin is 10.8, hematocrit is 31.1, platelets 172,000, recheck was 4.7.  Urine culture showed 20,000 colonies.  CK and troponins were negative x3.  TSH is 1.59.  Diagnostic HIDA scan showed delayed filling of the gallbladder after morphine consistent with chronic cholecystitis.  Common bile duct was patent, February 17, 2011.  Abdominal ultrasound March 18 shows the wall echo shadow consistent with small stones or calcified gallbladder. The common bile duct was 5 mm.  Liver showed increased echogenicity. Pancreas, spleen and kidneys were normal.  Abdominal aorta was nonaneurysmal.  Chest x-ray was unremarkable.  IMPRESSION: 1. Chronic cholecystitis. 2. Sickle cell with recent crisis. 3. Slightly elevated LFTs. 4. Hypertension. 5. Insulin-dependent adult onset diabetes mellitus. 6. History of reported myocardial infarction with cardiac     catheterization in 2005, noncritical coronary artery disease,     normal EF. 7. Dyslipidemia. 8. Degenerative joint disease.  PLAN:  The patient admitted and seen and evaluated by Dr. Zenia Resides.  This is most likely a gallbladder, could be secondary to her sickle cell but it is Dr. Ayesha Rumpf opinion that the patient be best served with cholecystectomy.  We will tentatively plan  to schedule the procedure tomorrow.     Lydia Guiles, P.A.   ______________________________ Mare Loan, MD    WDJ/MEDQ  D:  02/18/2011  T:  02/18/2011  Job:  [id number]  cc:   Jacquelynn Cree, M.D.  Ernestene Kiel, M.D. Fax: [phone]  Electronically Signed by Earnstine Regal P.A. on 03/01/2011 10:54:35 AM Electronically Signed by Ronnald Collum MD on 03/12/2011 12:09:58 PM

## 2011-03-12 NOTE — Op Note (Signed)
[name], [name]                 ACCOUNT NO.:  [id number]  MEDICAL RECORD NO.:  [id number]           PATIENT TYPE:  O  LOCATION:  1401                         FACILITY:  [facility]  PHYSICIAN:  Mare Loan, MD      DATE OF BIRTH:  09/14/1946  DATE OF PROCEDURE: DATE OF DISCHARGE:                              OPERATIVE REPORT   PREOPERATIVE DIAGNOSIS:  Biliary colic.  POSTOPERATIVE DIAGNOSIS:  Biliary colic.  PROCEDURE:  Laparoscopic cholecystectomy with cholangiogram.  SURGEON:  Mare Loan, MD  ASSISTANT:  OR staff.  FINDINGS:  Multiple small stones within the gallbladder.  Common bile duct flowed into the duodenum and the right left hepatic ducts without difficulty.  No immediate complications.  SPECIMENS:  Gallbladder.  ESTIMATED BLOOD LOSS:  Minimal.  ANESTHESIA:  General endotracheal.  COMPLICATIONS:  No immediate complications.  DRAINS:  No drains were placed.  INDICATIONS FOR PROCEDURE:  [name] is a 65 year old female who came in with abdominal pain and elevation of transaminases.  She had stones on her ultrasound.  She had right upper quadrant epigastric and chest pain. She continued to have some discomfort.  She continued to have nausea.  I had seen her and evaluated.  She did have sickle cell disease.  However, I felt as if she likely had symptoms from her biliary system as well.  A long discussion with the patient as well as her husband this morning.  I think proceeding with cholecystectomy would help with some of her discomfort.  I relate that the possibility could be that this was from her sickle cell disease.  Due to elevation of liver enzymes, I felt that we will do a cholangiogram at the same time.  Informed consent was obtained.  She was on Zosyn preoperatively.  DETAILS OF PROCEDURE:  [name] was identified in the preoperative holding area, seen by Anesthesia.  She was then taken to operating suite, placed in supine position.  Sequential  compression hose was applied to her lower extremity.  After she was placed under general endotracheal anesthesia, abdomen was prepped and draped in the usual sterile fashion.  Surgical time-out performed.  I began with placing an incision in the right upper quadrant using 5-mm camera and it is OptiVu. Placed the trocar through the abdominal wall.  All layers of abdominal wall were visualized upon entry.  I inspected the abdomen, found no evidence of injury upon placement of trocar.  I then placed an additional 11-mm trocar at the umbilical region.  A small umbilical hernia was noted.  The patient was placed in reverse Trendelenburg position.  Two additional trocars were placed in the right upper quadrant and visualization with camera.  Gallbladder was identified in normal Trendelenburg position.  The fundus of the gallbladder was retracted by dissection and retracted the infundibulum away from liver bed using blunt dissection electrocautery.  I was able to dissect a critical view of the cystic duct and artery.  I clipped the cystic duct distally.  I partially transected.  There were several small stones within the cystic duct.  I was able to milk  these out.  I then placed the cholangiogram catheter within the duct.  The first attempt, there was some leakage of the Omnipaque.  I repositioned the catheter.  The cholangiogram was performed without difficulty.  Flow went to the duodenum, right and left hepatic duct.  She had a very long cystic duct. I then removed the catheter and placed 3 clips proximally on the cystic duct.  Also placed PDS Endoloop across the cystic duct.  This was because she had some small stones within the cystic duct and I wanted to make sure that this was sealed completely.  I then divided the artery after clipping.  I continued dissecting gallbladder off the liver bed using electrocautery.  The specimen was placed in an EndoCatch bag and removed from the umbilical  incision.  I irrigated the abdomen with a liter of saline, found no evidence of bleeding from the liver bed.  No bile leakage.  Clips were in place.  I then closed the umbilical hernia using #1 Novafil interrupted sutures.  I then did one final inspection of the abdomen and found no evidence of bleeding or injury and released pneumoperitoneum, removed the trocars.  40 mL with 0.25%Marcaine with epinephrine used for local block.  The patient was awoke and transferred to [place] [facility] in stable condition.  She will be monitored for a day or so and then discharged home.     Mare Loan, MD     ALA/MEDQ  D:  02/19/2011  T:  02/19/2011  Job:  [id number]  Electronically Signed by Ronnald Collum MD on 03/12/2011 12:10:01 PM

## 2011-04-17 ENCOUNTER — Encounter (INDEPENDENT_AMBULATORY_CARE_PROVIDER_SITE_OTHER): Payer: Self-pay | Admitting: General Surgery

## 2011-04-18 NOTE — H&P (Signed)
[name], [name]                 ACCOUNT NO.:  [id number]   MEDICAL RECORD NO.:  [id number]          PATIENT TYPE:  OIB   LOCATION:  2550                         FACILITY:  [facility]   PHYSICIAN:  Marily Memos, MD      DATE OF BIRTH:  Sep 23, 1946   DATE OF ADMISSION:  12/31/2004  DATE OF DISCHARGE:                                HISTORY & PHYSICAL   PREOPERATIVE DIAGNOSIS:  Bilateral aseptic necrosis, hips with pain.   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  The patient has been  treated with anti-inflammatories and pain medications.  The patient presents  for core boring and bone grafting to the left hip.   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   REVIEW OF SYMPTOMS:  Basically that of bilateral hip pain, some episodes of  chest pain from angina, no urinary or bowel symptoms.   SOCIAL HISTORY:  Patient quit smoking 10 years ago.  No history of use of  alcohol or illegal drugs.   FAMILY HISTORY:  High blood pressure and diabetes and sickle cell disease.   MEDICATIONS:  1.  Glipizide ER 70 mg daily.  2.  Metformin HCL ER 500 mg b.i.d.  3.  Norvasc 5 mg daily.  4.  Lipitor 20 mg daily.  5.  Synthroid 100 mcg daily.  6.  Folic acid 1 gram daily.  7.  Hydrocodone.  8.  Lantus insulin 14 units at bedtime.  9.  Altace 10 mg daily.  10. Lodine 400 mg b.i.d.  11. Nitrol tabs PRN.  12. Aspirin 81 mg daily.  13. Betimol eye drops at bedtime.   ALLERGIES:   None known.   PHYSICAL EXAMINATION:  GENERAL:  Alert and oriented in no acute distress.  VITAL SIGNS:  Temperature 97.3, pulse 85, respirations 20, blood pressure  179/83.  Height 63.5 inches, weight 151.  HEENT: Head normocephalic.  Eyes pupils equal, round, reactive to light and  accommodation, sclerae clear.  NECK:  Supple.  CHEST:  Clear.  CARDIAC:  S1, S2 regular.  EXTREMITIES:  Bilateral hips tender anteriorly and laterally.  Range of  motion is good with some pain on rotation.  Neurovascular status is intact.   CLINICAL DATA:  MRI demonstrates bilateral aseptic necrosis femoral heads.   IMPRESSION:  1.  Aseptic necrosis, femoral head, left hip.  2.  Sickle cell disease.  3.  High blood pressure.  4.  Diabetes mellitus.  5.  Heart disease.   PLAN:  Core boring and bone grafting, left hip.      AC/MEDQ  D:  12/31/2004  T:  12/31/2004  Job:  [id number]

## 2011-04-18 NOTE — Op Note (Signed)
[facility]  Patient:    [name], [name]                          MRN: [id number] Proc. Date: 10/30/00 Adm. Date:  [id number] Disc. Date: [id number] Attending:  Karle Starch                           Operative Report  PREOPERATIVE DIAGNOSIS:  Opaque posterior capsule of left eye.  POSTOPERATIVE DIAGNOSIS:  Opaque posterior capsule of the left eye.  OPERATION PERFORMED:  YAG laser capsulotomy.  SURGEON:  Melanie Crazier. Bobbye Charleston., M.D.  ANESTHESIA:  None.  INDICATIONS FOR PROCEDURE:  The patient is a 65 year old lady who underwent a cateract extraction of the left eye August 06, 2000.  She has not achieved her full postoperative visual acuity because of opacification of the posterior capsule.  YAG laser capsulotomy was recommended and she is admitted at this time for that purpose.  DESCRIPTION OF PROCEDURE:  The patient was brought to the laser room and positioned appropriately behind the YAG laser.  Approximately 16 applications of laser energy at 2 millijoules applied to the posterior capsule and an opening of 3 to 4 mm was obtained.  The patient tolerated the procedure well and was discharged to the post anesthesia care unit with instructions to see me in the office this afternoon for further evaluation.  DISCHARGE DIAGNOSES:  Opaque posterior capsule, left eye. DD:  10/30/00 TD:  10/30/00 Job: [id number]

## 2011-04-18 NOTE — Consult Note (Signed)
[name], [name]                             ACCOUNT NO.:  [id number]   MEDICAL RECORD NO.:  [id number]                   PATIENT TYPE:  INP   LOCATION:  [id number]                                 FACILITY:  [facility]   PHYSICIAN:  Marily Memos, M.D.                 DATE OF BIRTH:  09/01/1946   DATE OF CONSULTATION:  05/15/2004  DATE OF DISCHARGE:                                   CONSULTATION   REFERRING PHYSICIAN:  Ernestene Kiel, M.D.   REASON FOR CONSULTATION:  Right hip pain, rule out aseptic necrosis, right  hip.   PERTINENT HISTORY:  This is a 65 year old black female sickle cell patient  who states that this past Sunday she developed the sudden onset of right hip  and groin anterior pain.  The pain generated all the way down to the right  ankle with some tingling sensation.  The patient states that she has no  particular history of any trauma or injury to the area.  The patient states  that she also had an episode of severe diarrhea and nausea following the  onset of the hip and leg pain.  The patient describes the pain as sharp and  severe in nature.  The patient denies any pain or discomfort in the left  lower extremity.  No febrile episodes.  No urinary symptoms.  The patient  was admitted to the hospital for this particular pain on May 14, 2004.  The  patient states that since she has been on medication and bed rest, the pain  itself has markedly improved, and states that she has only had a pain  medication approximately 6 hours ago.   PAST MEDICAL HISTORY:  1. Diabetes mellitus.  2. Hypertension.  3. Sickle cell disease.  4. Hypothyroidism.  5. Hypercholesterolemia.   SOCIAL HISTORY:  Denies the use of alcohol or illegal drugs or tobacco.   MEDICATIONS:  1. Metformin.  2. Norvasc.  3. Altace.  4. Synthroid.  5. Lipitor.  6. Glipizide.  7. Foley catheter.  8. Hydrocodone.   ALLERGIES:  None known.   PHYSICAL EXAMINATION:  GENERAL:  Alert and oriented, in  no acute distress,  lying in the prone position.  VITAL SIGNS:  Temperature 99, pulse 55, respirations 16, blood pressure  180/116.  RIGHT HIP:  Range of motion is full.  Straight-leg raise is negative with  __________ intact __________.  Negative Homans test.  Some discomfort on hip  extension, hip adduction.  Tender palpable nodules about the anterior thigh.  LUMBAR:  Tender to palpation.  Negative sciatic notch.   X-rays reviewed and revealed some mild degenerative joint changes.   LABORATORY DATA:  Elevated white cell count of 14.1, hemoglobin 12.5,  hematocrit 36.  Blood sugar of 235.  Urinalysis was negative.   IMPRESSION:  1. Sickle cell crisis.  2. Synovitis of the right hip.  Rule out  hip necrosis.  3. Sciatica back pain.   RECOMMENDATIONS:  MRI of right hip to rule out aseptic necrosis.  If MRI is  positive, recommend __________ of the right hip.  If MRI is negative,  continue the previous things you have done when symptomatic.                                               Marily Memos, M.D.    AC/MEDQ  D:  05/15/2004  T:  05/15/2004  Job:  [id number]

## 2011-04-18 NOTE — Op Note (Signed)
NAME:  [name], [name]                 ACCOUNT NO.:  [id number]   MEDICAL RECORD NO.:  [id number]          PATIENT TYPE:  AMB   LOCATION:  SDS                          FACILITY:  [facility]   PHYSICIAN:  Garlan Fair., M.D.DATE OF BIRTH:  14-Apr-1946   DATE OF PROCEDURE:  DATE OF DISCHARGE:  06/22/2009                               OPERATIVE REPORT   PREOPERATIVE DIAGNOSIS:  Immature cataract, right eye.   POSTOPERATIVE DIAGNOSIS:  Immature cataract, right eye.   OPERATIONS:  Kelman phacoemulsification cataract, right eye.   ANESTHESIA:  Local using Xylocaine 2% with Marcaine, 0.75% [place]ase.   JUSTIFICATION FOR PROCEDURE:  This is a 65 year old diabetic lady who  underwent cataract extraction of the left eye several years ago.  She  complains of blurring of vision with difficulty seeing to read.  She was  evaluated and found to have visual acuity best corrected 20/50 on the  right, 20/25 on the left.  There was a dense posterior subcapsular  cataract of the right eye with intraocular lens present on the left.  Cataract extraction with intraocular lens implantation was recommended.  She is admitted at this time for that purpose.   PROCEDURE:  Under the influence of IV sedation, a Van Lint akinesia and  retrobulbar anesthesia was given.  The patient was prepped and draped in  the usual manner.  A lid speculum was inserted under the upper and lower  lid of the right eye and a 4-0 silk traction suture was passed through  the belly of the superior rectus muscle retraction.  A fornix-based  conjunctival flap was turned and hemostasis achieved using cautery.  An  incision was made in the sclera at the limbus.  This incision was  dissected down to the cornea using a crescent blade.  A sideport  incision was made at 1:30 o'clock position.  An OcuCoat was injected  into the eye through the sideport incision.  The anterior chamber was  then entered through the corneoscleral tunnel incision at  11:30 o'clock  position.  An anterior capsulotomy was made using a bent 25-gauge  needle, and the nucleus was hydrodissected using Xylocaine.  The KPE  handpiece was passed into the eye and the nucleus was emulsified without  difficulty.  The residual cortical material was aspirated.  Posterior  capsule was polished using olive-tip polisher.  The wound was widened  slightly to accommodate a foldable silicone lens.  The lens was seated  into the eye behind the iris without difficulty.  The anterior chamber  was reformed and the pupils constricted using Miochol.  The lips of the  wound were hydrated and tested to make sure that there was no leak.  After ascertaining there is no leak, the conjunctiva was closed over  wound using thermal cautery.  A 1 mL of Celestone, 0.5 mL of gentamicin  were then injected subconjunctivally.  Maxitrol ophthalmic ointment and  prilocaine ointment were applied along with a patch and Fox shield.  The  patient tolerated the procedure well and was discharged to the post  anesthesia recovery in a satisfactory condition.  She was  instructed to rest today, to take Vicodin every 4 hours as needed for  pain, and see me in office tomorrow for further evaluation.   DISCHARGE DIAGNOSIS:  Immature cataract, right eye.      Garlan Fair., M.D.  Electronically Signed     TB/MEDQ  D:  06/23/2009  T:  06/24/2009  Job:  [id number]

## 2011-04-18 NOTE — Cardiovascular Report (Signed)
NAME:  [name], [name]                 ACCOUNT NO.:  [id number]   MEDICAL RECORD NO.:  [id number]          PATIENT TYPE:  OIB   LOCATION:  6501                         FACILITY:  [facility]   PHYSICIAN:  Mohan N. Terrence Dupont, M.D. DATE OF BIRTH:  Oct 01, 1946   DATE OF PROCEDURE:  09/12/2004  DATE OF DISCHARGE:                              CARDIAC CATHETERIZATION   PROCEDURE:  Left cardiac catheterization with selective left and right  coronary angiography, left ventriculography via right groin using Judkins  technique.   INDICATIONS FOR PROCEDURE:  The patient is a 65 year old black female with  past medical history significant for hypertension and insulin-requiring  diabetes mellitus.  She has sickle cell disease, hypothyroidism,  degenerative joint disease, complains of retrosternal and left-sided chest  pain off and on for the last two months associated with stress and exertion,  radiating to the back.  Denies any nausea, vomiting, or diaphoresis.  Denies  palpitations, lightheadedness, or syncope.  Denies PND, orthopnea, or leg  swelling.  States the chest pain lasts for a few minutes.  Denies any  prolonged chest pain in the past.  The patient had preoperative EKG done  which showed normal sinus rhythm with septal MI, age undetermined and T wave  inversion in anterolateral and flat T waves in inferior leads.  The patient  is referred from Ernestene Kiel, M.D.'s office for further evaluation  due to the new EKG changes.   PAST MEDICAL HISTORY:  As above.   PAST SURGICAL HISTORY:  She had laser surgery of the left eye in 1989.   ALLERGIES:  No known drug allergies.   MEDICATIONS:  1.  Glipizide 10 mg p.o. daily.  2.  Metformin 500 mg p.o. b.i.d. which was stopped on September 09, 2004.  3.  Norvasc 5 mg p.o. daily.  4.  Lipitor 20 mg p.o. daily.  5.  Synthroid 100 mcg p.o. daily.  6.  Folic acid 1 mg p.o. daily.  7.  Lantus Insulin 14 units q.h.s.  8.  Hydrocodone on p.r.n.  basis.  9.  Baby aspirin 81 mg p.o. daily.  10. Altace 5 mg p.o. daily.   SOCIAL HISTORY:  She is married with no children.  Smokes less than half  pack per day for 30+ years, quit six years ago.  No history of alcohol  abuse.  Presently on disability, did housekeeping work in the past.   FAMILY HISTORY:  Father died of MI at the age of 5.  He was diabetic.  Mother is alive and she is 96.  Two brothers are diabetic.  One sister died  of gunshot wound.   PHYSICAL EXAMINATION:  GENERAL:  She is alert and oriented x3 and in no  acute distress.  VITAL SIGNS:  Blood pressure 150/80, pulse 76.  HEENT:  Conjunctivae pink.  NECK:  Supple, no JVD and no bruit.  LUNGS:  Clear to auscultation without rhonchi or rales.  HEART:  S1 and S2 was normal.  There was no S3 gallop.  ABDOMEN:  Soft, bowel sounds were present and nontender.  EXTREMITIES:  No  cyanosis, clubbing, or edema.   IMPRESSION:  1.  New onset angina.  2.  Abnormal electrocardiogram.  3.  Probable old septal myocardial infarction in the past.  4.  Hypertension.  5.  Insulin-dependent diabetes mellitus.  6.  Hypercholesterolemia.  7.  Sickle cell disease.  8.  Degenerative joint disease.  9.  Hypothyroidism.   Discussed with the patient regarding various options of treatment, i.e,  noninvasive stress testing versus left catheterization, its risks and  benefits, i.e., death, MI, stroke, need for emergency CABG, local vascular  complications, __________ .   DESCRIPTION OF PROCEDURE:  After obtaining the informed consent, the patient  was brought to the catheterization lab and was placed on the fluoroscopy  table. Right groin was prepped and draped in the usual sterile fashion.  2%  Xylocaine was used for local anesthesia in the right groin.  With the help  of thin-wall needle, a 4 French arterial sheath was placed.  The sheath was  aspirated and flushed.  Next, 4 French left Judkins catheter was advanced  over the wire  under fluoroscopic guidance up to the ascending aorta.  Wire  was pulled out, the catheter was aspirated and connected to the manifold.  The catheter was further advanced and engaged into the left coronary ostium.  Multiple views of the left system were taken.  Next, the catheter was  disengaged and was pulled out and was replaced with 4 [place] JR4 right  Judkins catheter which was advanced over the wire under fluoroscopic  guidance.  This catheter could not be engaged as the patient had anterior  takeoff and this catheter was replaced with 3.5 right Judkins catheter which  was advanced over the wire under fluoroscopic guidance up to the ascending  aorta.  Wire was pulled out.  The catheter was aspirated and connected to  the manifold.  The catheter was further advanced and engaged into the right  coronary ostium.  Multiple views of the right system were taken.  Next, the  catheter was disengaged and was pulled out over the wire and was repaired  with 4 French pigtail catheter which was advanced over the wire under  fluoroscopic guidance up to the ascending aorta.  Wire was pulled out.  The  catheter was aspirated and connected to the manifold.  The catheter was  further advanced across the aortic valve into the LV.  LV pressures were  recorded.  Next, left ventriculography was done in 30 degree RAO position.  Postangiographic pressures were recorded from the LV and then pullback  pressures were recorded from the aorta.  There was no gradient across the  aortic valve.  Next, the pigtail catheter was pulled out over the wire.  Sheaths were aspirated and flushed.   FINDINGS:  The LV showed good LV systolic function, LVH, EF of 60 to 65%.  Left main has 10% distal stenosis.  LAD has 30% ostial stenosis.  Diagonal 1  and 2 are very small which were patent.  The left circumflex has 10 to  15% proximal stenosis.  OM1 was small which was patent.  OM2 and OM3 were very small, less than 0.5 mm.   OM4 was small which was patent.  The RCA was  patent.  The patient tolerated the procedure well and there were no  complications.  The patient was transferred to the recovery room in stable  condition.       MNH/MEDQ  D:  09/12/2004  T:  09/12/2004  Job:  [id number]   cc:   Leola Brazil., M.D.  [facility] [address] [place]  [place] [address]  Fax: [phone]

## 2011-04-18 NOTE — Op Note (Signed)
NAMEJANNAE, [name]                 ACCOUNT NO.:  [id number]   MEDICAL RECORD NO.:  [id number]          PATIENT TYPE:  AMB   LOCATION:  SDS                          FACILITY:  [facility]   PHYSICIAN:  Marily Memos, MD      DATE OF BIRTH:  [birth year]   DATE OF PROCEDURE:  06/24/2006  DATE OF DISCHARGE:  06/24/2006                                 OPERATIVE REPORT   PREOPERATIVE DIAGNOSIS:  Trigger right long finger.   POSTOPERATIVE DIAGNOSIS:  Trigger right long finger.   ANESTHESIA:  General.   PROCEDURE:  Release of right trigger long finger.   SURGEON:  Marily Memos, M.D.   DESCRIPTION OF PROCEDURE:  The patient was taken to the operating room and  given adequate preoperative mediations.  She was given general anesthesia  and intubated.  The patient's right hand was prepped with DuraPrep and  draped in a sterile manner.  Tourniquet and Bovie used for hemostasis.  Transverse incision made in the base of the MCP joint of the long finger  through the skin and subcutaneous tissue.  Sharp blunt dissection made down  to the transverse retinaculum.  This was identified and released and a  portion of the rectum was resected.  Flexor tendons completely free without  any catching or locking.  Irrigation was then done.  Wound closure then done  with 4-0 nylon.  Compressive dressing was applied.  The patient tolerated  procedure quite well and went to the recovery room in stable and  satisfactory position.   DISCHARGE INSTRUCTIONS:  1. The patient was discharged home on Percocet 5 mg q.6 h. p.r.n. for      pain.  2. Ice packs, elevation, use of sling and return to the office in one      week.   DISPOSITION:  The patient is discharged stable and satisfactory position.      Marily Memos, MD  Electronically Signed     AC/MEDQ  D:  07/21/2006  T:  07/22/2006  Job:  [id number]

## 2011-04-18 NOTE — Op Note (Signed)
[name], [name]                 ACCOUNT NO.:  [id number]   MEDICAL RECORD NO.:  [id number]          PATIENT TYPE:  OIB   LOCATION:  2550                         FACILITY:  [facility]   PHYSICIAN:  Marily Memos, MD      DATE OF BIRTH:  19-Sep-1946   DATE OF PROCEDURE:  12/31/2004  DATE OF DISCHARGE:                                 OPERATIVE REPORT   PREOPERATIVE DIAGNOSIS:  Aseptic necrosis, left hip.   POSTOPERATIVE DIAGNOSIS:  Aseptic necrosis, left hip.   ANESTHESIA:  General.   PROCEDURE:  Core boring and bone grafting to left femoral head with bone  graft from the left trochanter.   DESCRIPTION OF PROCEDURE:  The patient was taken to the operating room after  being given adequate preoperative medications and given general anesthesia  and intubated.  The left hip was prepped with DuraPrep and draped in sterile  manner.  C-Arm was used to visualize placement of the guide wire into the  aseptic area of the femoral head.  Lateral incision was made over the left  hip, down through the skin and subcutaneous tissue, fascia lata and vastus  lateralis with sharp and blunt dissection down about the trochanter.  A 130  degree angle guide was used for placement of the guide pin.  Depth was 75  mm.  Reaming was done down to the aseptic area, which was sclerotic, reaming  into the area was done anteriorly and posteriorly, superior and inferior  about the area.  Curettes were used to remove bone graft from the trochanter  and proximal femur.  This was then inserted into the femoral head and into  the canal of the neck and impacted into the femoral head and into the reamed  aseptic area.  After adequate packing and bone grafting to the area,  irrigation was then done with saline.  Wound closure was then done with 1-0  for vastus lateralis and fascia lata, 0 for subcutaneous and 2-0 for  subcutaneous and skin staples for the skin.  Compressive dressing was  applied.  The patient tolerated the  procedure quite well and went to the  recovery room in stable and satisfactory condition.   The patient is being kept for 23 hour observation for pain control.  The  patient will be discharged on Percocet two q.4h. PRN for pain, ice packs,  non weight-bearing on the left side and to return to the office in a 10 day  period.      AC/MEDQ  D:  12/31/2004  T:  12/31/2004  Job:  [id number]

## 2011-04-18 NOTE — Discharge Summary (Signed)
NAMEDANENE, SANTAMARINA                 ACCOUNT NO.:  [id number]   MEDICAL RECORD NO.:  [id number]          PATIENT TYPE:  INP   LOCATION:  [id number]                         FACILITY:  [facility]   PHYSICIAN:  Marily Memos, MD      DATE OF BIRTH:  February 29, 1946   DATE OF ADMISSION:  12/31/2004  DATE OF DISCHARGE:  01/03/2005                                 DISCHARGE SUMMARY   ADMISSION DIAGNOSES:  Aseptic necrosis left hip.   DISCHARGE DIAGNOSIS:  Aseptic necrosis left hip.   COMPLICATIONS:  None.   INFECTIONS:  None.   PROCEDURE:  Core boring and bone grafting left femoral head.   HISTORY OF PRESENT ILLNESS:  This is a 65 year old patient who developed  aseptic necrosis of bilateral hips with left being worse than the right.  This is without femoral head collapse.   PHYSICAL EXAMINATION:  Pertinent physical was that of the hips.  Bilateral  hips tender anteriorly and laterally.  Range of motion is good with some  pain on rotation.  Neurovascular status is intact.   HOSPITAL COURSE:  The patient had preoperative medical evaluation by Ernestene Kiel, M.D. and found to be stable enough to undergo surgery.  The  patient had preoperative laboratory, CBC, CMET, EKG, chest x-ray,  urinalysis.  The patient's laboratory was found to be stable to undergo  surgery.  The patient underwent core boring of the left hip.  Tolerated the  procedure quite well.  Postoperative course was fairly benign.  Pain was  brought under control.  The patient was placed on a walker, nonweightbearing  on the left side, and was able to be discharged on Percocet q.4h p.r.n. for  pain, nonweightbearing on the left side with the use of a walker, and to  return to the office in 10-day period.  The patient was discharged in stable  and satisfactory condition.      AC/MEDQ  D:  03/25/2005  T:  03/26/2005  Job:  [id number]

## 2011-04-18 NOTE — Op Note (Signed)
[name], [name]                 ACCOUNT NO.:  [id number]   MEDICAL RECORD NO.:  [id number]          PATIENT TYPE:  AMB   LOCATION:  SDS                          FACILITY:  [facility]   PHYSICIAN:  Marily Memos, MD      DATE OF BIRTH:  [birth year]   DATE OF PROCEDURE:  DATE OF DISCHARGE:  06/24/2006                                 OPERATIVE REPORT   PREOPERATIVE DIAGNOSIS:  Trigger right long finger.   POSTOPERATIVE DIAGNOSIS:  Trigger right long finger.   ANESTHESIA:  General.   PROCEDURE:  Release of right trigger finger.   DESCRIPTION OF PROCEDURE:  The patient was taken to the operating room and  after given adequate preoperative medication, given general anesthesia and  intubated, right hand was prepped and draped in a sterile manner.  Bovie  used for hemostasis.  Transverse incision was made at the base of the MCP  joint of the right long finger, going through the skin and subcutaneous  tissue.  Hemostasis was obtained.  Transverse retinaculum was identified and  completely released and partially resected.  Testing of the finger did not  demonstrate any locking at that particular time.  Wound closure was then  done with the use of 4-0 nylon, bulky compressive dressing was applied.  The  patient tolerated the procedure well, brought to the recovery room in stable  and satisfactory condition.   The patient is being discharged home, ice packs, elevation, use of sling.  Return to the office in 1 week.  Pain control with Ultram ER 200 mg once  daily.  The patient is being discharged in stable and satisfactory  condition.      Marily Memos, MD  Electronically Signed     AC/MEDQ  D:  06/24/2006  T:  06/25/2006  Job:  [id number]

## 2011-04-18 NOTE — Discharge Summary (Signed)
[name], [name]                             ACCOUNT NO.:  [id number]   MEDICAL RECORD NO.:  [id number]                   PATIENT TYPE:  INP   LOCATION:  [id number]                                 FACILITY:  [facility]   PHYSICIAN:  Ernestene Kiel, M.D.          DATE OF BIRTH:  09/01/1946   DATE OF ADMISSION:  05/14/2004  DATE OF DISCHARGE:  05/21/2004                                 DISCHARGE SUMMARY   ADMITTING DIAGNOSES:  1. Sickle cell crisis with severe pain, hemoglobin [place] disease with right hip     pain.  2. Avascular necrosis of right hip on x-ray today.  3. Type 2 diabetes mellitus, uncontrolled.  Insulin added.  4. 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  5. Hypothyroidism post radiation.  6. Hypocholesterolemia.   DISCHARGE DIAGNOSES:  1. Sickle cell crisis with severe pain and hemoglobin [place] disease.  2. Type 2 diabetes mellitus on insulin.  3. Avascular necrosis of hips.  4. Hypothyroidism.  5. Hypocholesterolemia.   ALLERGIES:  No known drug allergies.   LABORATORIES AND STUDIES:  On May 14, 2004 CBC showed WBC of 14.1,  hemoglobin 12.5, hematocrit 36.5, platelets 290.  On May 17, 2004  hemoglobin was 11.4, hematocrit 33.2.  On May 14, 2004 chemistries were  within normal limits except glucose of 235 and total bilirubin was 1.4.  On  May 18, 2004 the glucose was 128.  On May 15, 2004 lipid profile showed  cholesterol 155, triglycerides of 111, HDL 42, ___________ of 91.  On May 15, 2004 TSH was 0.330.  On May 14, 2004 x-ray of the right hip showed  probable avascular necrosis involving both femoral heads, left greater than  right without fracture or other acute abnormalities.  MRI of the hips shows  bilateral stage I avascular necrosis of the femoral heads, periostitis of  the right femoral shaft.  These findings were all felt to be secondary to  the patient's sickle cell disease.   HOSPITAL COURSE:  The patient was admitted.  X-rays were obtained of the  right hip which results are noted in the laboratory section.  IV therapy was  initiated with 20 mg of potassium.  Dilaudid was given IV 1 mg with 12.5  Phenergan q.2h. for severe pain.  Vicodin also was added for pain control  5/500 p.o. one q.4h. p.r.n. mild to moderate pain.  For her type 2 diabetes  she was started on glipizide 10 mg p.o. a.c. breakfast.  Sliding scale  insulin orders were given.  She was started on Norvasc 10 mg daily and  Altace 10 mg capsules p.o. daily.  She was also started on Synthroid 0.15 mg  p.o. daily.  Her Norvasc was decreased to 5 mg p.o. daily.  An orthopedic  consult was done by Dr. Marily Memos which he ordered an MRI of the right  hip.  The patient had an episode of constipation.  She received lactulose  q.a.m. and q.h.s. and one to two Dulcolax tablets q.h.s. p.r.n. for  constipation.  For hypocholesterolemia she was started on Zocor 20 mg tablet  daily.  After the MRI Dr. Eulas Post ordered PT and nonweightbearing of right  hip with walker.  The patient was taught how to get insulin per nursing  staff.  She was also started on Lantus 12 units q.a.m. a.c. breakfast.  Hold  if CB check is below 80.  Lantus insulin was changed to 14 units q.h.s.  Also, she was started on Glucophage 500 mg tablet p.o. with breakfast.   CONDITION ON DISCHARGE:  The patient is discharged home in stable condition.  The patient relates feeling improved, requiring less pain medications.  She  still had mild tenderness in right hip.   DISCHARGE MEDICATIONS:  1. Glipizide 10 mg tablet a.c. breakfast.  2. Norvasc 5 mg tablet p.o. daily.  3. Metformin 500 mg tablet one b.i.d. with breakfast and dinner.  4. Lantus insulin 14 units q.h.s. if CBG is above 120 subcutaneously.  5. Zocor 20 mg tablet q.h.s.  6. Synthroid 0.2 mg tablet one daily.  7. Folic acid 1 mg tablet one daily.  8. Aspirin 81 mg tablet one daily.  9. Altace 10 mg tablet one daily.  10.      Vicodin 5/500 mg  tablet one q.4h. p.r.n. pain.   DISCHARGE INSTRUCTIONS:  Pain management:  See discharge medications.  Activity:  Ambulate as tolerated with rolling walker, wheelchair for long  distance mobility.  Diet:  1800 calorie ADA diet.   SPECIAL INSTRUCTIONS:  Notify M.D. if blood glucose check is above 250.  CBG  checks q.a.m. a.c. breakfast and h.s.  Get appointment at [place].  Follow-up appointment with Dr. Katherine Roan June 20, 2004 at 11:30 a.m. Diabetes outpatient [phone] and will need blood draw  at the next office visit.     Dionne D. Kateri Plummer, M.D.    DDR/MEDQ  D:  06/13/2004  T:  06/13/2004  Job:  [id number]   cc:   Marily Memos, M.D.  [place]  [place] [address]  Fax: [phone]

## 2011-07-10 ENCOUNTER — Other Ambulatory Visit: Payer: Self-pay | Admitting: Pulmonary Disease

## 2011-07-10 DIAGNOSIS — Z1231 Encounter for screening mammogram for malignant neoplasm of breast: Secondary | ICD-10-CM

## 2011-08-01 ENCOUNTER — Ambulatory Visit
Admission: RE | Admit: 2011-08-01 | Discharge: 2011-08-01 | Disposition: A | Discharge status: Home / Self Care | Payer: Medicare Other | Source: Ambulatory Visit | Attending: Pulmonary Disease | Admitting: Pulmonary Disease

## 2011-08-01 DIAGNOSIS — Z1231 Encounter for screening mammogram for malignant neoplasm of breast: Secondary | ICD-10-CM

## 2012-04-08 ENCOUNTER — Ambulatory Visit
Admission: RE | Admit: 2012-04-08 | Discharge: 2012-04-08 | Disposition: A | Discharge status: Home / Self Care | Payer: Medicare Other | Source: Ambulatory Visit | Attending: Orthopedic Surgery | Admitting: Orthopedic Surgery

## 2012-04-08 ENCOUNTER — Other Ambulatory Visit: Payer: Self-pay | Admitting: Orthopedic Surgery

## 2012-04-08 DIAGNOSIS — M25552 Pain in left hip: Secondary | ICD-10-CM

## 2012-04-08 DIAGNOSIS — M25551 Pain in right hip: Secondary | ICD-10-CM

## 2012-08-20 ENCOUNTER — Other Ambulatory Visit: Payer: Self-pay | Admitting: Pulmonary Disease

## 2012-08-20 DIAGNOSIS — Z1231 Encounter for screening mammogram for malignant neoplasm of breast: Secondary | ICD-10-CM

## 2012-09-06 ENCOUNTER — Ambulatory Visit: Payer: Medicare Other

## 2012-10-01 ENCOUNTER — Ambulatory Visit
Admission: RE | Admit: 2012-10-01 | Discharge: 2012-10-01 | Disposition: A | Discharge status: Home / Self Care | Payer: Medicare Other | Source: Ambulatory Visit | Attending: Pulmonary Disease | Admitting: Pulmonary Disease

## 2012-10-01 DIAGNOSIS — Z1231 Encounter for screening mammogram for malignant neoplasm of breast: Secondary | ICD-10-CM

## 2012-10-04 ENCOUNTER — Other Ambulatory Visit: Payer: Self-pay | Admitting: Pulmonary Disease

## 2012-10-04 DIAGNOSIS — R928 Other abnormal and inconclusive findings on diagnostic imaging of breast: Secondary | ICD-10-CM

## 2012-10-13 ENCOUNTER — Ambulatory Visit
Admission: RE | Admit: 2012-10-13 | Discharge: 2012-10-13 | Disposition: A | Discharge status: Home / Self Care | Payer: Medicare Other | Source: Ambulatory Visit | Attending: Pulmonary Disease | Admitting: Pulmonary Disease

## 2012-10-13 DIAGNOSIS — R928 Other abnormal and inconclusive findings on diagnostic imaging of breast: Secondary | ICD-10-CM

## 2012-12-08 ENCOUNTER — Other Ambulatory Visit: Payer: Self-pay | Admitting: Orthopedic Surgery

## 2012-12-08 ENCOUNTER — Ambulatory Visit
Admission: RE | Admit: 2012-12-08 | Discharge: 2012-12-08 | Disposition: A | Discharge status: Home / Self Care | Payer: Medicare Other | Source: Ambulatory Visit | Attending: Orthopedic Surgery | Admitting: Orthopedic Surgery

## 2012-12-08 DIAGNOSIS — M25551 Pain in right hip: Secondary | ICD-10-CM

## 2012-12-08 DIAGNOSIS — M25659 Stiffness of unspecified hip, not elsewhere classified: Secondary | ICD-10-CM

## 2012-12-08 DIAGNOSIS — M25552 Pain in left hip: Secondary | ICD-10-CM

## 2013-05-08 ENCOUNTER — Encounter (HOSPITAL_COMMUNITY): Payer: Self-pay | Admitting: Emergency Medicine

## 2013-05-08 ENCOUNTER — Emergency Department (HOSPITAL_COMMUNITY)
Admission: EM | Admit: 2013-05-08 | Discharge: 2013-05-08 | Disposition: A | Discharge status: Home / Self Care | Payer: Medicare Other | Attending: Emergency Medicine | Admitting: Emergency Medicine

## 2013-05-08 DIAGNOSIS — Z79899 Other long term (current) drug therapy: Secondary | ICD-10-CM | POA: Insufficient documentation

## 2013-05-08 DIAGNOSIS — Z7982 Long term (current) use of aspirin: Secondary | ICD-10-CM | POA: Insufficient documentation

## 2013-05-08 DIAGNOSIS — L0201 Cutaneous abscess of face: Secondary | ICD-10-CM | POA: Insufficient documentation

## 2013-05-08 DIAGNOSIS — Z794 Long term (current) use of insulin: Secondary | ICD-10-CM | POA: Insufficient documentation

## 2013-05-08 DIAGNOSIS — L03211 Cellulitis of face: Secondary | ICD-10-CM | POA: Insufficient documentation

## 2013-05-08 DIAGNOSIS — E1159 Type 2 diabetes mellitus with other circulatory complications: Secondary | ICD-10-CM | POA: Insufficient documentation

## 2013-05-08 DIAGNOSIS — L02811 Cutaneous abscess of head [any part, except face]: Secondary | ICD-10-CM

## 2013-05-08 MED ORDER — SULFAMETHOXAZOLE-TRIMETHOPRIM 800-160 MG PO TABS: 1.0000 | ORAL_TABLET | Freq: Two times a day (BID) | ORAL | Status: DC

## 2013-05-08 MED ORDER — SULFAMETHOXAZOLE-TMP DS 800-160 MG PO TABS
1.0000 | ORAL_TABLET | Freq: Once | ORAL | Status: AC
  Administered 2013-05-08: 1 via ORAL
  Filled 2013-05-08: qty 1

## 2013-05-08 MED ORDER — LIDOCAINE-EPINEPHRINE 1 %-1:100000 IJ SOLN
10.0000 mL | Freq: Once | INTRAMUSCULAR | Status: AC
  Administered 2013-05-08: 10 mL
  Filled 2013-05-08: qty 1

## 2013-05-08 NOTE — ED Notes (Signed)
Pt. Stated, My face started swelling yesterday evening and its worse today.  I also have a scab and knot on top of my head for about a week.

## 2013-05-08 NOTE — ED Provider Notes (Signed)
History    67yF with atraumatic L facial swelling. Gradual onset about 2 days ago. Small painful lesion to forehead which she noticed 4-5 preceding this. No fever or chills. No n/v. No drainage. Is a diabetic. No intervention prior to arrival.   CSN: [id number]  Arrival date & time 05/08/13  1335   First MD Initiated Contact with Patient 05/08/13 1400      Chief Complaint  Patient presents with  . Facial Swelling    (Consider location/radiation/quality/duration/timing/severity/associated sxs/prior treatment) HPI  Past Medical History  Diagnosis Date  . Abdominal pain     Past Surgical History  Procedure Laterality Date  . Refractive surgery    . Hip surgery      6 yrs ago    Family History  Problem Relation Age of Onset  . Diabetes Mother   . Cancer Father     Prostate  . Diabetes Father   . Cancer Brother   . Diabetes Brother     History  Substance Use Topics  . Smoking status: Never Smoker   . Smokeless tobacco: Not on file  . Alcohol Use: No    OB History   Grav Para Term Preterm Abortions TAB SAB Ect Mult Living                  Review of Systems  All systems reviewed and negative, other than as noted in HPI.   Allergies  Bee venom  Home Medications   Current Outpatient Rx  Name  Route  Sig  Dispense  Refill  . amLODipine-olmesartan (AZOR) 10-40 MG per tablet   Oral   Take 1 tablet by mouth daily.         Marland Kitchen aspirin 81 MG tablet   Oral   Take 81 mg by mouth daily.          Marland Kitchen atorvastatin (LIPITOR) 40 MG tablet   Oral   Take 40 mg by mouth daily.         . celecoxib (CELEBREX) 200 MG capsule   Oral   Take 200 mg by mouth 2 (two) times daily. Stop taking during the summer months and will start taking again in the Fall         . cycloSPORINE (RESTASIS) 0.05 % ophthalmic emulsion   Both Eyes   Place 1 drop into both eyes 2 (two) times daily.         . folic acid (FOLVITE) 1 MG tablet   Oral   Take 1 mg by mouth daily.           Marland Kitchen glipiZIDE (GLUCOTROL XL) 5 MG 24 hr tablet   Oral   Take 5 mg by mouth daily.         . insulin glargine (LANTUS) 100 UNIT/ML injection   Subcutaneous   Inject 12 Units into the skin at bedtime.         Marland Kitchen levothyroxine (SYNTHROID, LEVOTHROID) 75 MCG tablet   Oral   Take 75 mcg by mouth daily before breakfast.         . metFORMIN (GLUCOPHAGE-XR) 500 MG 24 hr tablet   Oral   Take 500 mg by mouth 2 (two) times daily.         . ramipril (ALTACE) 10 MG capsule   Oral   Take 10 mg by mouth 2 (two) times daily.          . timolol (TIMOPTIC) 0.5 % ophthalmic solution   Both Eyes  Place 1 drop into both eyes 2 (two) times daily.         . travoprost, benzalkonium, (TRAVATAN) 0.004 % ophthalmic solution   Both Eyes   Place 1 drop into both eyes at bedtime.         . sulfamethoxazole-trimethoprim (SEPTRA DS) 800-160 MG per tablet   Oral   Take 1 tablet by mouth every 12 (twelve) hours.   6 tablet   0     BP 174/87  Pulse 84  Temp(Src) 98.2 F (36.8 C) (Oral)  SpO2 96%  Physical Exam  Nursing note and vitals reviewed. Constitutional: She appears well-developed and well-nourished. No distress.  HENT:  Head: Normocephalic and atraumatic.  Eyes: Conjunctivae are normal. Right eye exhibits no discharge. Left eye exhibits no discharge.  Neck: Neck supple.  Cardiovascular: Normal rate, regular rhythm and normal heart sounds.  Exam reveals no gallop and no friction rub.   No murmur heard. Pulmonary/Chest: Effort normal and breath sounds normal. No respiratory distress.  Abdominal: Soft. She exhibits no distension. There is no tenderness.  Musculoskeletal: She exhibits no edema and no tenderness.  Neurological: She is alert.  Skin: Skin is warm and dry.  ~2 cm lesions consistent with an abscess along hairline L forehead. Scabbed with some purulent drainage. No surrounding cellulitis. Mild edema to L forehead.   Psychiatric: She has a normal mood and  affect. Her behavior is normal. Thought content normal.    ED Course  Procedures (including critical care time)  INCISION AND DRAINAGE Performed by: Virgel Manifold Consent: Verbal consent obtained. Risks and benefits: risks, benefits and alternatives were discussed Type: abscess  Body area: scalp  Anesthesia: local infiltration  Incision was made with a scalpel.  Local anesthetic: lidocaine 2% w epinephrine  Anesthetic total: 2 ml  Complexity: complex Blunt dissection to break up loculations  Drainage: purulent  Drainage amount: moderate  Packing material: none   Patient tolerance: Patient tolerated the procedure well with no immediate complications.     Labs Reviewed - No data to display No results found.   1. Scalp abscess       MDM  67 year old female sw/ mall abscess of her scalp. Possibly started as a folliculitis. Incised and drained. Given that it is on face/scalp and she is a diabetic, will give patient a short course of antibiotics. There is no surrounding cellulitis though. Continued wound care and return cautions were discussed.        Virgel Manifold, MD 05/11/13 [phone]

## 2013-05-08 NOTE — ED Notes (Signed)
Dr. Wilson Singer in doing procedure,

## 2013-09-07 ENCOUNTER — Other Ambulatory Visit: Payer: Self-pay

## 2013-09-07 DIAGNOSIS — Z1231 Encounter for screening mammogram for malignant neoplasm of breast: Secondary | ICD-10-CM

## 2013-10-04 ENCOUNTER — Ambulatory Visit
Admission: RE | Admit: 2013-10-04 | Discharge: 2013-10-04 | Disposition: A | Discharge status: Home / Self Care | Payer: PRIVATE HEALTH INSURANCE | Source: Ambulatory Visit

## 2013-10-04 DIAGNOSIS — Z1231 Encounter for screening mammogram for malignant neoplasm of breast: Secondary | ICD-10-CM

## 2014-08-31 ENCOUNTER — Other Ambulatory Visit: Payer: Self-pay

## 2014-08-31 DIAGNOSIS — Z1231 Encounter for screening mammogram for malignant neoplasm of breast: Secondary | ICD-10-CM

## 2014-10-05 ENCOUNTER — Ambulatory Visit
Admission: RE | Admit: 2014-10-05 | Discharge: 2014-10-05 | Disposition: A | Discharge status: Home / Self Care | Payer: Commercial Managed Care - HMO | Source: Ambulatory Visit

## 2014-10-05 DIAGNOSIS — Z1231 Encounter for screening mammogram for malignant neoplasm of breast: Secondary | ICD-10-CM

## 2015-03-16 ENCOUNTER — Emergency Department (HOSPITAL_COMMUNITY): Payer: Medicare Other

## 2015-03-16 ENCOUNTER — Encounter (HOSPITAL_COMMUNITY): Payer: Self-pay | Admitting: Emergency Medicine

## 2015-03-16 ENCOUNTER — Emergency Department (HOSPITAL_COMMUNITY)
Admission: EM | Admit: 2015-03-16 | Discharge: 2015-03-16 | Disposition: A | Discharge status: Home / Self Care | Payer: Medicare Other | Attending: Emergency Medicine | Admitting: Emergency Medicine

## 2015-03-16 DIAGNOSIS — M79606 Pain in leg, unspecified: Secondary | ICD-10-CM

## 2015-03-16 DIAGNOSIS — Z79899 Other long term (current) drug therapy: Secondary | ICD-10-CM | POA: Insufficient documentation

## 2015-03-16 DIAGNOSIS — Z794 Long term (current) use of insulin: Secondary | ICD-10-CM | POA: Insufficient documentation

## 2015-03-16 DIAGNOSIS — Z791 Long term (current) use of non-steroidal anti-inflammatories (NSAID): Secondary | ICD-10-CM | POA: Insufficient documentation

## 2015-03-16 DIAGNOSIS — Z7982 Long term (current) use of aspirin: Secondary | ICD-10-CM | POA: Diagnosis not present

## 2015-03-16 DIAGNOSIS — M87052 Idiopathic aseptic necrosis of left femur: Secondary | ICD-10-CM | POA: Diagnosis not present

## 2015-03-16 DIAGNOSIS — D5700 Hb-SS disease with crisis, unspecified: Secondary | ICD-10-CM

## 2015-03-16 HISTORY — DX: Sickle-cell disease without crisis: D57.1

## 2015-03-16 LAB — COMPREHENSIVE METABOLIC PANEL
ALT: 18 U/L (ref 0–35)
AST: 21 U/L (ref 0–37)
Albumin: 4.8 g/dL (ref 3.5–5.2)
Alkaline Phosphatase: 102 U/L (ref 39–117)
Anion gap: 7 (ref 5–15)
BUN: 21 mg/dL (ref 6–23)
CO2: 25 mmol/L (ref 19–32)
Calcium: 9.5 mg/dL (ref 8.4–10.5)
Chloride: 111 mmol/L (ref 96–112)
Creatinine, Ser: 1.02 mg/dL (ref 0.50–1.10)
GFR calc Af Amer: 65 mL/min — ABNORMAL LOW (ref >90)
GFR calc non Af Amer: 56 mL/min — ABNORMAL LOW (ref >90)
Glucose, Bld: 181 mg/dL — ABNORMAL HIGH (ref 70–99)
Potassium: 4 mmol/L (ref 3.5–5.1)
Sodium: 143 mmol/L (ref 135–145)
Total Bilirubin: 1 mg/dL (ref 0.3–1.2)
Total Protein: 8 g/dL (ref 6.0–8.3)

## 2015-03-16 LAB — CBG MONITORING, ED: Glucose-Capillary: 164 mg/dL — ABNORMAL HIGH (ref 70–99)

## 2015-03-16 LAB — CBC WITH DIFFERENTIAL/PLATELET
Basophils Absolute: 0 10*3/uL (ref 0.0–0.1)
Basophils Relative: 0 % (ref 0–1)
Eosinophils Absolute: 0 10*3/uL (ref 0.0–0.7)
Eosinophils Relative: 0 % (ref 0–5)
HCT: 32.2 % — ABNORMAL LOW (ref 36.0–46.0)
Hemoglobin: 11.5 g/dL — ABNORMAL LOW (ref 12.0–15.0)
Lymphocytes Relative: 20 % (ref 12–46)
Lymphs Abs: 2 10*3/uL (ref 0.7–4.0)
MCH: 30 pg (ref 26.0–34.0)
MCHC: 35.7 g/dL (ref 30.0–36.0)
MCV: 84.1 fL (ref 78.0–100.0)
Monocytes Absolute: 0.8 10*3/uL (ref 0.1–1.0)
Monocytes Relative: 8 % (ref 3–12)
Neutro Abs: 7.2 10*3/uL (ref 1.7–7.7)
Neutrophils Relative %: 72 % (ref 43–77)
Platelets: 216 10*3/uL (ref 150–400)
RBC: 3.83 MIL/uL — ABNORMAL LOW (ref 3.87–5.11)
RDW: 17.5 % — ABNORMAL HIGH (ref 11.5–15.5)
WBC: 10.1 10*3/uL (ref 4.0–10.5)

## 2015-03-16 LAB — RETICULOCYTES
RBC.: 3.83 MIL/uL — ABNORMAL LOW (ref 3.87–5.11)
Retic Count, Absolute: 183.8 10*3/uL (ref 19.0–186.0)
Retic Ct Pct: 4.8 % — ABNORMAL HIGH (ref 0.4–3.1)

## 2015-03-16 MED ORDER — CYCLOBENZAPRINE HCL 10 MG PO TABS
5.0000 mg | ORAL_TABLET | Freq: Once | ORAL | Status: AC
  Administered 2015-03-16: 5 mg via ORAL
  Filled 2015-03-16: qty 1

## 2015-03-16 MED ORDER — SODIUM CHLORIDE 0.9 % IV BOLUS (SEPSIS)
1000.0000 mL | Freq: Once | INTRAVENOUS | Status: AC
  Administered 2015-03-16: 1000 mL via INTRAVENOUS

## 2015-03-16 MED ORDER — ONDANSETRON HCL 4 MG/2ML IJ SOLN
4.0000 mg | Freq: Once | INTRAMUSCULAR | Status: AC
  Administered 2015-03-16: 4 mg via INTRAVENOUS
  Filled 2015-03-16: qty 2

## 2015-03-16 MED ORDER — MORPHINE SULFATE 4 MG/ML IJ SOLN
4.0000 mg | Freq: Once | INTRAMUSCULAR | Status: AC
  Administered 2015-03-16: 4 mg via INTRAVENOUS
  Filled 2015-03-16: qty 1

## 2015-03-16 MED ORDER — CYCLOBENZAPRINE HCL 5 MG PO TABS: 5.0000 mg | ORAL_TABLET | Freq: Three times a day (TID) | ORAL | Status: DC | PRN

## 2015-03-16 MED ORDER — OXYCODONE-ACETAMINOPHEN 5-325 MG PO TABS: 1.0000 | ORAL_TABLET | Freq: Four times a day (QID) | ORAL | Status: DC | PRN

## 2015-03-16 MED ORDER — RAMIPRIL 10 MG PO CAPS
10.0000 mg | ORAL_CAPSULE | Freq: Once | ORAL | Status: AC
  Administered 2015-03-16: 10 mg via ORAL
  Filled 2015-03-16: qty 1

## 2015-03-16 NOTE — ED Notes (Signed)
Patient was educated not to drive, operate heavy machinery, or drink alcohol while taking narcotic medication.  

## 2015-03-16 NOTE — Discharge Instructions (Signed)
Take percocet for severe pain. DO NOT drive with it.   Take flexeril for muscle spasms.   Follow up with your doctor.   Return to ER if you have severe pain, trouble walking, fever, chest pain.

## 2015-03-16 NOTE — ED Notes (Signed)
Per pt, states sickle cell pain in left leg since yesterday

## 2015-03-16 NOTE — ED Provider Notes (Signed)
CSN: [id number]     Arrival date & time 03/16/15  [id number] History   First MD Initiated Contact with Patient 03/16/15 1002     Chief Complaint  Patient presents with  . Sickle Cell Pain Crisis     (Consider location/radiation/quality/duration/timing/severity/associated sxs/prior Treatment) The history is provided by the patient.  [name] is a 69 y.o. female remote history of sickle cell, here presenting with left leg pain. Left leg pain since yesterday. Patient states that this is similar to her previous sickle cell pain. Last sickle cell crisis was several years ago. Denies any injury to the leg. States that is a crampy feeling. Denies abdominal pain or chest pain. She is currently not on any medicines for sickle cell.    Past Medical History  Diagnosis Date  . Abdominal pain   . Sickle cell anemia    Past Surgical History  Procedure Laterality Date  . Refractive surgery    . Hip surgery      6 yrs ago   Family History  Problem Relation Age of Onset  . Diabetes Mother   . Cancer Father     Prostate  . Diabetes Father   . Cancer Brother   . Diabetes Brother    History  Substance Use Topics  . Smoking status: Never Smoker   . Smokeless tobacco: Not on file  . Alcohol Use: No   OB History    No data available     Review of Systems  Musculoskeletal:       L leg pain   All other systems reviewed and are negative.     Allergies  Bee venom  Home Medications   Prior to Admission medications   Medication Sig Start Date End Date Taking? Authorizing Provider  acetaminophen (TYLENOL) 500 MG tablet Take 1,000 mg by mouth every 4 (four) hours as needed (for pain).   Yes Historical Provider, MD  amLODipine-olmesartan (AZOR) 10-40 MG per tablet Take 1 tablet by mouth daily.   Yes Historical Provider, MD  aspirin EC 81 MG tablet Take 81 mg by mouth daily.   Yes Historical Provider, MD  atorvastatin (LIPITOR) 40 MG tablet Take 40 mg by mouth daily.   Yes Historical  Provider, MD  Calcium Carb-Cholecalciferol 600-800 MG-UNIT TABS Take 1 tablet by mouth daily.   Yes Historical Provider, MD  celecoxib (CELEBREX) 200 MG capsule Take 200 mg by mouth 2 (two) times daily.    Yes Historical Provider, MD  cycloSPORINE (RESTASIS) 0.05 % ophthalmic emulsion Place 1 drop into both eyes 2 (two) times daily.   Yes Historical Provider, MD  ferrous sulfate 325 (65 FE) MG tablet Take 325 mg by mouth daily with breakfast.   Yes Historical Provider, MD  folic acid (FOLVITE) 1 MG tablet Take 1 mg by mouth daily.    Yes Historical Provider, MD  glipiZIDE (GLUCOTROL XL) 5 MG 24 hr tablet Take 5 mg by mouth daily.   Yes Historical Provider, MD  insulin glargine (LANTUS) 100 UNIT/ML injection Inject 12 Units into the skin at bedtime.   Yes Historical Provider, MD  levothyroxine (SYNTHROID, LEVOTHROID) 75 MCG tablet Take 75 mcg by mouth daily before breakfast.   Yes Historical Provider, MD  metFORMIN (GLUCOPHAGE-XR) 500 MG 24 hr tablet Take 500 mg by mouth 2 (two) times daily.   Yes Historical Provider, MD  metoprolol succinate (TOPROL-XL) 25 MG 24 hr tablet Take 25 mg by mouth every evening.   Yes Historical Provider, MD  ramipril (ALTACE) 10 MG capsule Take 10 mg by mouth 2 (two) times daily.    Yes Historical Provider, MD  timolol (TIMOPTIC) 0.5 % ophthalmic solution Place 1 drop into both eyes 2 (two) times daily.   Yes Historical Provider, MD  travoprost, benzalkonium, (TRAVATAN) 0.004 % ophthalmic solution Place 1 drop into both eyes at bedtime.   Yes Historical Provider, MD  sulfamethoxazole-trimethoprim (SEPTRA DS) 800-160 MG per tablet Take 1 tablet by mouth every 12 (twelve) hours. Patient not taking: Reported on 03/16/2015 05/08/13   Virgel Manifold, MD   BP 176/65 mmHg  Pulse 69  Temp(Src) 97.7 F (36.5 C) (Oral)  Resp 16  SpO2 100% Physical Exam  Constitutional: She is oriented to person, place, and time.  Uncomfortable   HENT:  Head: Normocephalic.  Mouth/Throat:  Oropharynx is clear and moist.  Eyes: Conjunctivae are normal. Pupils are equal, round, and reactive to light.  Neck: Normal range of motion. Neck supple.  Cardiovascular: Normal rate, regular rhythm and normal heart sounds.   Pulmonary/Chest: Effort normal and breath sounds normal. No respiratory distress. She has no wheezes. She has no rales.  Abdominal: Soft. Bowel sounds are normal. She exhibits no distension. There is no tenderness. There is no rebound and no guarding.  Musculoskeletal:  L thigh muscle tenderness, dec ROM L hip due to pain but no hip tenderness. 2+ pulses. Neurovascular intact LLE.   Neurological: She is alert and oriented to person, place, and time.  Skin: Skin is warm and dry.  Psychiatric: She has a normal mood and affect. Her behavior is normal. Judgment and thought content normal.  Nursing note and vitals reviewed.   ED Course  Procedures (including critical care time) Labs Review Labs Reviewed  CBC WITH DIFFERENTIAL/PLATELET - Abnormal; Notable for the following:    RBC 3.83 (*)    Hemoglobin 11.5 (*)    HCT 32.2 (*)    RDW 17.5 (*)    All other components within normal limits  COMPREHENSIVE METABOLIC PANEL - Abnormal; Notable for the following:    Glucose, Bld 181 (*)    GFR calc non Af Amer 56 (*)    GFR calc Af Amer 65 (*)    All other components within normal limits  RETICULOCYTES - Abnormal; Notable for the following:    Retic Ct Pct 4.8 (*)    RBC. 3.83 (*)    All other components within normal limits  CBG MONITORING, ED - Abnormal; Notable for the following:    Glucose-Capillary 164 (*)    All other components within normal limits    Imaging Review Dg Pelvis 1-2 Views  03/16/2015   CLINICAL DATA:  Left pelvic and hip pain beginning yesterday. No known injury.  EXAM: PELVIS - 1-2 VIEW  COMPARISON:  12/08/2012  FINDINGS: There is no evidence of pelvic fracture or diastasis. No pelvic bone lesions are seen. Calcified uterine fibroid again seen  in right pelvis.  Chronic sclerosis is again seen along the superior articular surface of the left femoral head, without joint space narrowing or other signs of arthropathy. This is consistent with chronic avascular necrosis.  IMPRESSION: No acute findings.  Chronic avascular necrosis of the left femoral head.  Calcified uterine fibroid in right pelvis.   Electronically Signed   By: Earle Gell M.D.   On: 03/16/2015 10:50   Dg Femur Min 2 Views Left  03/16/2015   CLINICAL DATA:  Left femur pain since yesterday. No known injury. Sickle cell anemia.  EXAM: LEFT FEMUR 2 VIEWS  COMPARISON:  12/08/2012  FINDINGS: No evidence of femur fracture.  Chronic sclerosis is seen along the superior articular surface of the left femoral head, without other signs of hip arthropathy. This is unchanged and consistent with chronic avascular necrosis.  Peripherally sclerotic lesions with central lucency also seen throughout the left femoral diaphysis, without evidence of osteolysis, periostitis, or other aggressive features. This is most consistent with chronic bone infarcts.  IMPRESSION: No acute findings.  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 These findings are likely secondary to known history of sickle cell anemia.   Electronically Signed   By: Earle Gell M.D.   On: 03/16/2015 10:55     EKG Interpretation None      MDM   Final diagnoses:  Leg pain    [name] is a 69 y.o. female here with L leg pain. Likely MSK. Consider diabetic neuropathy vs sickle cell as well. Will get labs, xrays. Will give pain meds and muscle relaxants and reassess.   12:11 PM Xray showed chronic avascular necrosis L femoral head. Hg improved from previous, reticulocyte slightly elevated. I think likely mild sickle cell crisis vs muscle strain. Will dc home with percocet, flexeril.    Wandra Arthurs, MD 03/16/15 [phone]

## 2015-06-16 ENCOUNTER — Emergency Department (HOSPITAL_COMMUNITY): Payer: Medicare Other

## 2015-06-16 ENCOUNTER — Encounter (HOSPITAL_COMMUNITY): Payer: Self-pay | Admitting: Emergency Medicine

## 2015-06-16 ENCOUNTER — Emergency Department (HOSPITAL_COMMUNITY)
Admission: EM | Admit: 2015-06-16 | Discharge: 2015-06-16 | Disposition: A | Discharge status: Home / Self Care | Payer: Medicare Other | Attending: Emergency Medicine | Admitting: Emergency Medicine

## 2015-06-16 DIAGNOSIS — M879 Osteonecrosis, unspecified: Secondary | ICD-10-CM | POA: Diagnosis not present

## 2015-06-16 DIAGNOSIS — Z794 Long term (current) use of insulin: Secondary | ICD-10-CM | POA: Insufficient documentation

## 2015-06-16 DIAGNOSIS — Z79899 Other long term (current) drug therapy: Secondary | ICD-10-CM | POA: Diagnosis not present

## 2015-06-16 DIAGNOSIS — H409 Unspecified glaucoma: Secondary | ICD-10-CM | POA: Diagnosis not present

## 2015-06-16 DIAGNOSIS — E039 Hypothyroidism, unspecified: Secondary | ICD-10-CM | POA: Diagnosis not present

## 2015-06-16 DIAGNOSIS — Z87891 Personal history of nicotine dependence: Secondary | ICD-10-CM | POA: Insufficient documentation

## 2015-06-16 DIAGNOSIS — M8700 Idiopathic aseptic necrosis of unspecified bone: Secondary | ICD-10-CM

## 2015-06-16 DIAGNOSIS — I10 Essential (primary) hypertension: Secondary | ICD-10-CM | POA: Diagnosis not present

## 2015-06-16 DIAGNOSIS — E119 Type 2 diabetes mellitus without complications: Secondary | ICD-10-CM | POA: Diagnosis not present

## 2015-06-16 DIAGNOSIS — Z7982 Long term (current) use of aspirin: Secondary | ICD-10-CM | POA: Diagnosis not present

## 2015-06-16 DIAGNOSIS — D5700 Hb-SS disease with crisis, unspecified: Secondary | ICD-10-CM | POA: Diagnosis not present

## 2015-06-16 HISTORY — DX: Essential (primary) hypertension: I10

## 2015-06-16 HISTORY — DX: Type 2 diabetes mellitus without complications: E11.9

## 2015-06-16 HISTORY — DX: Unspecified glaucoma: H40.9

## 2015-06-16 HISTORY — DX: Hypothyroidism, unspecified: E03.9

## 2015-06-16 LAB — CBC WITH DIFFERENTIAL/PLATELET
Basophils Absolute: 0 10*3/uL (ref 0.0–0.1)
Basophils Relative: 0 % (ref 0–1)
Eosinophils Absolute: 0.1 10*3/uL (ref 0.0–0.7)
Eosinophils Relative: 1 % (ref 0–5)
HCT: 33.3 % — ABNORMAL LOW (ref 36.0–46.0)
Hemoglobin: 11.7 g/dL — ABNORMAL LOW (ref 12.0–15.0)
Lymphocytes Relative: 20 % (ref 12–46)
Lymphs Abs: 1.9 10*3/uL (ref 0.7–4.0)
MCH: 28.9 pg (ref 26.0–34.0)
MCHC: 35.1 g/dL (ref 30.0–36.0)
MCV: 82.2 fL (ref 78.0–100.0)
Monocytes Absolute: 0.9 10*3/uL (ref 0.1–1.0)
Monocytes Relative: 9 % (ref 3–12)
Neutro Abs: 6.9 10*3/uL (ref 1.7–7.7)
Neutrophils Relative %: 70 % (ref 43–77)
Platelets: 275 10*3/uL (ref 150–400)
RBC: 4.05 MIL/uL (ref 3.87–5.11)
RDW: 17.5 % — ABNORMAL HIGH (ref 11.5–15.5)
WBC: 9.8 10*3/uL (ref 4.0–10.5)

## 2015-06-16 LAB — BASIC METABOLIC PANEL
Anion gap: 7 (ref 5–15)
BUN: 15 mg/dL (ref 6–20)
CO2: 26 mmol/L (ref 22–32)
Calcium: 9.6 mg/dL (ref 8.9–10.3)
Chloride: 107 mmol/L (ref 101–111)
Creatinine, Ser: 0.97 mg/dL (ref 0.44–1.00)
GFR calc Af Amer: >60 mL/min (ref >60)
GFR calc non Af Amer: 60 mL/min — ABNORMAL LOW (ref >60)
Glucose, Bld: 223 mg/dL — ABNORMAL HIGH (ref 65–99)
Potassium: 3.8 mmol/L (ref 3.5–5.1)
Sodium: 140 mmol/L (ref 135–145)

## 2015-06-16 LAB — SEDIMENTATION RATE: Sed Rate: 28 mm/hr — ABNORMAL HIGH (ref 0–22)

## 2015-06-16 MED ORDER — IBUPROFEN 400 MG PO TABS: 400.0000 mg | ORAL_TABLET | Freq: Four times a day (QID) | ORAL | Status: DC | PRN

## 2015-06-16 MED ORDER — MORPHINE SULFATE 4 MG/ML IJ SOLN
4.0000 mg | Freq: Once | INTRAMUSCULAR | Status: AC
  Administered 2015-06-16: 4 mg via INTRAVENOUS
  Filled 2015-06-16: qty 1

## 2015-06-16 MED ORDER — HYDROCODONE-ACETAMINOPHEN 5-325 MG PO TABS: 1.0000 | ORAL_TABLET | Freq: Four times a day (QID) | ORAL | Status: DC | PRN

## 2015-06-16 NOTE — ED Provider Notes (Signed)
CSN: [id number]     Arrival date & time 06/16/15  [id number] History   First MD Initiated Contact with Patient 06/16/15 [phone]     Chief Complaint  Patient presents with  . Sickle Cell Pain Crisis     (Consider location/radiation/quality/duration/timing/severity/associated sxs/prior Treatment) HPI Comments: Pt comes in with cc of sickle cell type sharp and throbbing pain. Pain is located on the R shoulder and started yday. Pain is not responding to home tylenol. She has constant pain, event at rest and was unable to sleep well. No trauma. No posterior neck pain. No cough. Pt has fairly well controlled sickle cell, and is not taking any narcotics.  Patient is a 69 y.o. female presenting with sickle cell pain. The history is provided by the patient.  Sickle Cell Pain Crisis Associated symptoms: no chest pain, no headaches, no nausea, no shortness of breath and no vomiting     Past Medical History  Diagnosis Date  . Abdominal pain   . Sickle cell anemia   . Glaucoma   . Diabetes mellitus without complication   . Hypertension   . Hypothyroid    Past Surgical History  Procedure Laterality Date  . Refractive surgery    . Hip surgery      6 yrs ago  . Retinal detachment surgery    . Cholecystectomy     Family History  Problem Relation Age of Onset  . Diabetes Mother   . Cancer Father     Prostate  . Diabetes Father   . Cancer Brother   . Diabetes Brother    History  Substance Use Topics  . Smoking status: Former Research scientist (life sciences)  . Smokeless tobacco: Not on file  . Alcohol Use: No   OB History    No data available     Review of Systems  Constitutional: Negative for activity change.  Respiratory: Negative for shortness of breath.   Cardiovascular: Negative for chest pain.  Gastrointestinal: Negative for nausea, vomiting and abdominal pain.  Genitourinary: Negative for dysuria.  Musculoskeletal: Positive for arthralgias. Negative for joint swelling and neck pain.  Skin: Negative for  rash.  Neurological: Negative for headaches.      Allergies  Bee venom  Home Medications   Prior to Admission medications   Medication Sig Start Date End Date Taking? Authorizing Provider  acetaminophen (TYLENOL) 500 MG tablet Take 1,000 mg by mouth every 6 (six) hours as needed for mild pain or moderate pain.    Yes Historical Provider, MD  amLODipine-olmesartan (AZOR) 10-40 MG per tablet Take 1 tablet by mouth daily.   Yes Historical Provider, MD  aspirin EC 81 MG tablet Take 81 mg by mouth daily.   Yes Historical Provider, MD  atorvastatin (LIPITOR) 40 MG tablet Take 40 mg by mouth daily.   Yes Historical Provider, MD  Calcium Carb-Cholecalciferol 600-800 MG-UNIT TABS Take 1 tablet by mouth daily.   Yes Historical Provider, MD  celecoxib (CELEBREX) 200 MG capsule Take 200 mg by mouth 2 (two) times daily.    Yes Historical Provider, MD  cycloSPORINE (RESTASIS) 0.05 % ophthalmic emulsion Place 1 drop into both eyes 2 (two) times daily.   Yes Historical Provider, MD  ferrous sulfate 325 (65 FE) MG tablet Take 325 mg by mouth daily with breakfast.   Yes Historical Provider, MD  folic acid (FOLVITE) 1 MG tablet Take 1 mg by mouth daily.    Yes Historical Provider, MD  glipiZIDE (GLUCOTROL XL) 5 MG 24 hr tablet Take  5 mg by mouth daily.   Yes Historical Provider, MD  insulin glargine (LANTUS) 100 UNIT/ML injection Inject 12 Units into the skin at bedtime.   Yes Historical Provider, MD  levothyroxine (SYNTHROID, LEVOTHROID) 75 MCG tablet Take 75 mcg by mouth daily before breakfast.   Yes Historical Provider, MD  metFORMIN (GLUCOPHAGE-XR) 500 MG 24 hr tablet Take 500 mg by mouth 2 (two) times daily.   Yes Historical Provider, MD  metoprolol succinate (TOPROL-XL) 25 MG 24 hr tablet Take 25 mg by mouth every evening.   Yes Historical Provider, MD  neomycin-bacitracin-polymyxin (NEOSPORIN) ointment Apply 1 application topically every 12 (twelve) hours. apply inside nose   Yes Historical Provider,  MD  ramipril (ALTACE) 10 MG capsule Take 10 mg by mouth 2 (two) times daily.    Yes Historical Provider, MD  timolol (TIMOPTIC) 0.5 % ophthalmic solution Place 1 drop into both eyes 2 (two) times daily.   Yes Historical Provider, MD  travoprost, benzalkonium, (TRAVATAN) 0.004 % ophthalmic solution Place 1 drop into both eyes at bedtime.   Yes Historical Provider, MD  azithromycin (ZITHROMAX) 250 MG tablet Take 250-500 mg by mouth daily. 500 mg on day 1 then take 250 mg for 4 days. 06/12/15   Historical Provider, MD  cyclobenzaprine (FLEXERIL) 5 MG tablet Take 1 tablet (5 mg total) by mouth 3 (three) times daily as needed for muscle spasms. Patient not taking: Reported on 06/16/2015 03/16/15   Wandra Arthurs, MD  HYDROcodone-acetaminophen (NORCO/VICODIN) 5-325 MG per tablet Take 1 tablet by mouth every 6 (six) hours as needed. 06/16/15   Varney Biles, MD  ibuprofen (ADVIL,MOTRIN) 400 MG tablet Take 1 tablet (400 mg total) by mouth every 6 (six) hours as needed. 06/16/15   Varney Biles, MD  oxyCODONE-acetaminophen (PERCOCET) 5-325 MG per tablet Take 1 tablet by mouth every 6 (six) hours as needed. Patient not taking: Reported on 06/16/2015 03/16/15   Wandra Arthurs, MD  sulfamethoxazole-trimethoprim (SEPTRA DS) 800-160 MG per tablet Take 1 tablet by mouth every 12 (twelve) hours. Patient not taking: Reported on 03/16/2015 05/08/13   Virgel Manifold, MD   BP 182/62 mmHg  Pulse 54  Temp(Src) 98.4 F (36.9 C) (Oral)  Resp 18  SpO2 99% Physical Exam  Constitutional: She is oriented to person, place, and time. She appears well-developed and well-nourished.  HENT:  Head: Normocephalic and atraumatic.  Eyes: EOM are normal. Pupils are equal, round, and reactive to light.  Neck: Neck supple.  Cardiovascular: Normal rate, regular rhythm and normal heart sounds.   No murmur heard. Pulmonary/Chest: Effort normal. No respiratory distress.  Abdominal: Soft. She exhibits no distension. There is no tenderness. There  is no rebound and no guarding.  Musculoskeletal:  R shoulder  - no anterior edema, no erythema, no callor.  Neurological: She is alert and oriented to person, place, and time.  Skin: Skin is warm and dry.  Nursing note and vitals reviewed.   ED Course  Procedures (including critical care time) Labs Review Labs Reviewed  CBC WITH DIFFERENTIAL/PLATELET - Abnormal; Notable for the following:    Hemoglobin 11.7 (*)    HCT 33.3 (*)    RDW 17.5 (*)    All other components within normal limits  BASIC METABOLIC PANEL - Abnormal; Notable for the following:    Glucose, Bld 223 (*)    GFR calc non Af Amer 60 (*)    All other components within normal limits  SEDIMENTATION RATE    Imaging Review Dg  Shoulder Right  06/16/2015   CLINICAL DATA:  Right shoulder pain x 2 days with no known injury; pt states that it could be due to her sickle cell pain crisis; no previous right shoulder injury  EXAM: RIGHT SHOULDER - 2+ VIEW  COMPARISON:  None.  FINDINGS: No fracture. Glenohumeral AC joints are normally spaced and aligned. Minor endplate osteophytes are noted at the [facility] joint consistent with osteoarthritis.  There is some sclerosis along the subchondral humeral head. This may reflect chronic avascular necrosis. There is no subchondral bony collapse.  IMPRESSION: 1. No fracture or dislocation.  No acute finding. 2. 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 3. Minor AC joint osteoarthritis.   Electronically Signed   By: Lajean Manes M.D.   On: 06/16/2015 08:35     EKG Interpretation None      MDM   Final diagnoses:  Avascular necrosis  Sickle cell anemia with crisis   Pt comes in with cc of R shoulder swelling. There is no evidence of infection on exam. The pains is reproducible. Xrays ordered, as there are none in record - and there is some evidence of chronic avascular nec.  She is at pain of 5/10 - down from 8/10 WITH 1  ROUND OF MORPHINE. Anticipate discharge, once pain improves even further.    Varney Biles, MD 06/16/15 1020

## 2015-06-16 NOTE — ED Notes (Signed)
MD at bedside. 

## 2015-06-16 NOTE — ED Notes (Signed)
Pt reported having sickle cell crisis with pain in rt arm/hand and neck, sweating and nausea. Pt took medication but did not help.

## 2015-06-16 NOTE — ED Notes (Signed)
Pt aware awaiting DG results.

## 2015-06-16 NOTE — Discharge Instructions (Signed)
Avascular Necrosis Avascular necrosis is a disease resulting from the temporary or permanent loss of the blood supply to the bones. Without blood, the bone tissue dies and causes the bone to become soft. If the process involves the bone near a joint, it may lead to collapse of the joint surface. This disease is also known as:  Osteonecrosis.  Aseptic necrosis.  Ischemic bone necrosis. Avascular necrosis most commonly affects the ends (epiphysis) of long bones. The femur, the bone extending from the knee joint to the hip joint, is the bone most commonly involved. The disease may affect 1 bone, more than 1 bone at the same time, more than 1 bone at different times. It affects men and women equally. Avascular necrosis occurs at any age. But it is more common between the ages of 66 and 66 years. SYMPTOMS  In early stages patients may not have any symptoms. But as the disease progresses, joint pain generally develops. At first there is pain when putting weight on the affected joint, and then when resting. Pain usually develops gradually. It may be mild or severe. As the disease progresses and the bone and surrounding joint surface collapses, pain may develop or increase dramatically. Pain may be severe enough to limit range of motion in the affected joint. The period of time between the first symptoms and loss of joint function is different for each patient. This can range from several months to more than a year. Disability depends on:  What part of the bone is affected.  How large an area is involved.  How effectively the bone repairs itself.  If other illnesses are present.  If you are being treated for cancer with medications (chemotherapy).  Radiation.  The cause of the avascular necrosis. DIAGNOSIS  The diagnosis of aseptic necrosis is usually made by:  Taking a history.  Doing an exam.  Taking X-rays. (If X-rays are normal, an MRI may be required.)  Sometimes further blood work and  specialized studies may be necessary. TREATMENT  Treatment for this disease is necessary to maintain joint function. If untreated, most patients will suffer severe pain and limitation in movement within 2 years. Several treatments are available that help prevent further bone and joint damage. They can also reduce pain. To determine the most appropriate treatment, the caregiver considers the following aspects of a patient's disease:  The age of the patient.  The stage of the disease (early or late).  The location and amount of bone affected. It may be a small or large area.  The underlying cause of avascular necrosis. The goals in treatment are to:  Improve the patient's use of the affected joint.  Stop further damage to the bone.  Improve bone and joint survival. Your caregiver may use one or more of the following treatments:  Reduced weight bearing. If avascular necrosis is diagnosed early, the caregiver may begin treatment by having the patient limit weight on the affected joint. The caregiver may recommend limiting activities or using crutches. In some cases, reduced weight bearing can slow the damage caused by the disease and permit natural healing. When combined with medication to reduce pain, reduced weight bearing can be an effective way to avoid or delay surgery for some patients. Most patients eventually will need surgery to reconstruct the joint.  Core decompression. Core decompression works best in people who are in the earliest stages of avascular necrosis, before the collapse of the joint. This procedure often can reduce pain and slow the progression  of bone and joint destruction in these patients. This surgical procedure removes the inner layer of bone, which:  Reduces pressure within the bone.  Increases blood flow to the bone.  Allows more blood vessels to form.  Reduces pain.  Osteotomy. This surgical procedure re-shapes the bone to reduce stress on the affected area  of the joint. There is a lengthy recovery period. The patient's activities are very limited for 3 to 12 months after an osteotomy. This procedure is most effective for younger patients with advanced avascular necrosis, and those with a large area of affected bone.  Bone Graft. A bone graft may be used to support a joint after core decompression. Bone grafting is surgery that transplants healthy bone from one part of the patient, such as the leg, to the diseased area. Sometimes the bone is taken with it's blood vessels which are attached to local blood vessels near the area of bone collapse. This is called a vascularized bone graft. There is a lengthy recovery period after a bone graft, usually from 6 to 12 months. This procedure is technically complex.  Arthroplasty. Arthroplasty is also known as total joint replacement. Total joint replacement is used in late-stage avascular necrosis, and when the joint is deformed. In this surgery, the diseased joint is replaced with artificial parts. It may be recommended for people who are not good candidates for other treatments, such as patients who may not do well with repeated attempts to preserve the joint. Various types of replacements are available, and patients should discuss specific needs with their caregiver. New treatments being tried include:  The use of medications.  Electrical stimulation.  Combination therapies to increase the growth of new bone and blood vessels. Document Released: 05/09/2002 Document Revised: 02/09/2012 Document Reviewed: 01/25/2014 [facility] Patient Information 2015 [place], [place]. This information is not intended to replace advice given to you by your health care provider. Make sure you discuss any questions you have with your health care provider.  Sickle Cell Anemia, Adult Sickle cell anemia is a condition in which red blood cells have an abnormal "sickle" shape. This abnormal shape shortens the cells' life span, which  results in a lower than normal concentration of red blood cells in the blood. The sickle shape also causes the cells to clump together and block free blood flow through the blood vessels. As a result, the tissues and organs of the body do not receive enough oxygen. Sickle cell anemia causes organ damage and pain and increases the risk of infection. CAUSES  Sickle cell anemia is a genetic disorder. Those who receive two copies of the gene have the condition, and those who receive one copy have the trait. RISK FACTORS The sickle cell gene is most common in people whose families originated in [place]. Other areas of the globe where sickle cell trait occurs include the [place], [place] and [place], and the [place].  SIGNS AND SYMPTOMS  Pain, especially in the extremities, back, chest, or abdomen (common). The pain may start suddenly or may develop following an illness, especially if there is dehydration. Pain can also occur due to overexertion or exposure to extreme temperature changes.  Frequent severe bacterial infections, especially certain types of pneumonia and meningitis.  Pain and swelling in the hands and feet.  Decreased activity.   Loss of appetite.   Change in behavior.  Headaches.  Seizures.  Shortness of breath or difficulty breathing.  Vision changes.  Skin ulcers. Those with the trait may  not have symptoms or they may have mild symptoms.  DIAGNOSIS  Sickle cell anemia is diagnosed with blood tests that demonstrate the genetic trait. It is often diagnosed during the newborn period, due to mandatory testing nationwide. A variety of blood tests, X-rays, CT scans, MRI scans, ultrasounds, and lung function tests may also be done to monitor the condition. TREATMENT  Sickle cell anemia may be treated with:  Medicines. You may be given pain medicines, antibiotic medicines (to treat and prevent infections) or medicines to increase the production of  certain types of hemoglobin.  Fluids.  Oxygen.  Blood transfusions. HOME CARE INSTRUCTIONS   Drink enough fluid to keep your urine clear or pale yellow. Increase your fluid intake in hot weather and during exercise.  Do not smoke. Smoking lowers oxygen levels in the blood.   Only take over-the-counter or prescription medicines for pain, fever, or discomfort as directed by your health care provider.  Take antibiotics as directed by your health care provider. Make sure you finish them it even if you start to feel better.   Take supplements as directed by your health care provider.   Consider wearing a medical alert bracelet. This tells anyone caring for you in an emergency of your condition.   When traveling, keep your medical information, health care provider's names, and the medicines you take with you at all times.   If you develop a fever, do not take medicines to reduce the fever right away. This could cover up a problem that is developing. Notify your health care provider.  Keep all follow-up appointments with your health care provider. Sickle cell anemia requires regular medical care. SEEK MEDICAL CARE IF: You have a fever. SEEK IMMEDIATE MEDICAL CARE IF:   You feel dizzy or faint.   You have new abdominal pain, especially on the left side near the stomach area.   You develop a persistent, often uncomfortable and painful penile erection (priapism). If this is not treated immediately it will lead to impotence.   You have numbness your arms or legs or you have a hard time moving them.   You have a hard time with speech.   You have a fever or persistent symptoms for more than 2-3 days.   You have a fever and your symptoms suddenly get worse.   You have signs or symptoms of infection. These include:   Chills.   Abnormal tiredness (lethargy).   Irritability.   Poor eating.   Vomiting.   You develop pain that is not helped with medicine.    You develop shortness of breath.  You have pain in your chest.   You are coughing up pus-like or bloody sputum.   You develop a stiff neck.  Your feet or hands swell or have pain.  Your abdomen appears bloated.  You develop joint pain. MAKE SURE YOU:  Understand these instructions. Document Released: 02/25/2006 Document Revised: 04/03/2014 Document Reviewed: 06/29/2013 [facility] Patient Information 2015 [place], [place]. This information is not intended to replace advice given to you by your health care provider. Make sure you discuss any questions you have with your health care provider.

## 2015-06-16 NOTE — ED Notes (Signed)
Pt transported to DG.  

## 2015-08-17 ENCOUNTER — Other Ambulatory Visit: Payer: Self-pay | Admitting: Gastroenterology

## 2015-09-26 ENCOUNTER — Other Ambulatory Visit: Payer: Self-pay

## 2015-09-26 DIAGNOSIS — Z1231 Encounter for screening mammogram for malignant neoplasm of breast: Secondary | ICD-10-CM

## 2015-10-16 ENCOUNTER — Ambulatory Visit
Admission: RE | Admit: 2015-10-16 | Discharge: 2015-10-16 | Disposition: A | Discharge status: Home / Self Care | Payer: Medicare Other | Source: Ambulatory Visit

## 2015-10-16 DIAGNOSIS — Z1231 Encounter for screening mammogram for malignant neoplasm of breast: Secondary | ICD-10-CM

## 2016-06-23 ENCOUNTER — Emergency Department (HOSPITAL_COMMUNITY)
Admission: EM | Admit: 2016-06-23 | Discharge: 2016-06-24 | Disposition: A | Discharge status: Home / Self Care | Payer: Medicare Other | Attending: Emergency Medicine | Admitting: Emergency Medicine

## 2016-06-23 ENCOUNTER — Emergency Department (HOSPITAL_COMMUNITY): Payer: Medicare Other

## 2016-06-23 ENCOUNTER — Encounter (HOSPITAL_COMMUNITY): Payer: Self-pay | Admitting: *Deleted

## 2016-06-23 DIAGNOSIS — Z7982 Long term (current) use of aspirin: Secondary | ICD-10-CM | POA: Insufficient documentation

## 2016-06-23 DIAGNOSIS — I10 Essential (primary) hypertension: Secondary | ICD-10-CM | POA: Insufficient documentation

## 2016-06-23 DIAGNOSIS — Z794 Long term (current) use of insulin: Secondary | ICD-10-CM | POA: Insufficient documentation

## 2016-06-23 DIAGNOSIS — I2510 Atherosclerotic heart disease of native coronary artery without angina pectoris: Secondary | ICD-10-CM | POA: Insufficient documentation

## 2016-06-23 DIAGNOSIS — Z87891 Personal history of nicotine dependence: Secondary | ICD-10-CM | POA: Diagnosis not present

## 2016-06-23 DIAGNOSIS — R0789 Other chest pain: Secondary | ICD-10-CM | POA: Insufficient documentation

## 2016-06-23 DIAGNOSIS — Z7984 Long term (current) use of oral hypoglycemic drugs: Secondary | ICD-10-CM | POA: Insufficient documentation

## 2016-06-23 DIAGNOSIS — D72829 Elevated white blood cell count, unspecified: Secondary | ICD-10-CM | POA: Insufficient documentation

## 2016-06-23 DIAGNOSIS — E039 Hypothyroidism, unspecified: Secondary | ICD-10-CM | POA: Diagnosis not present

## 2016-06-23 DIAGNOSIS — E119 Type 2 diabetes mellitus without complications: Secondary | ICD-10-CM | POA: Diagnosis not present

## 2016-06-23 DIAGNOSIS — R072 Precordial pain: Secondary | ICD-10-CM | POA: Diagnosis present

## 2016-06-23 HISTORY — DX: Atherosclerotic heart disease of native coronary artery without angina pectoris: I25.10

## 2016-06-23 LAB — URINALYSIS, ROUTINE W REFLEX MICROSCOPIC
Glucose, UA: NEGATIVE mg/dL
Hgb urine dipstick: NEGATIVE
Ketones, ur: NEGATIVE mg/dL
Nitrite: NEGATIVE
Protein, ur: 100 mg/dL — AB
Specific Gravity, Urine: 1.014 (ref 1.005–1.030)
pH: 5.5 (ref 5.0–8.0)

## 2016-06-23 LAB — BASIC METABOLIC PANEL
Anion gap: 8 (ref 5–15)
BUN: 17 mg/dL (ref 6–20)
CO2: 23 mmol/L (ref 22–32)
Calcium: 9.2 mg/dL (ref 8.9–10.3)
Chloride: 107 mmol/L (ref 101–111)
Creatinine, Ser: 1.42 mg/dL — ABNORMAL HIGH (ref 0.44–1.00)
GFR calc Af Amer: 43 mL/min — ABNORMAL LOW (ref >60)
GFR calc non Af Amer: 37 mL/min — ABNORMAL LOW (ref >60)
Glucose, Bld: 196 mg/dL — ABNORMAL HIGH (ref 65–99)
Potassium: 3.7 mmol/L (ref 3.5–5.1)
Sodium: 138 mmol/L (ref 135–145)

## 2016-06-23 LAB — CBC
HCT: 29.2 % — ABNORMAL LOW (ref 36.0–46.0)
Hemoglobin: 9.9 g/dL — ABNORMAL LOW (ref 12.0–15.0)
MCH: 27.9 pg (ref 26.0–34.0)
MCHC: 33.9 g/dL (ref 30.0–36.0)
MCV: 82.3 fL (ref 78.0–100.0)
Platelets: 233 10*3/uL (ref 150–400)
RBC: 3.55 MIL/uL — ABNORMAL LOW (ref 3.87–5.11)
RDW: 16.8 % — ABNORMAL HIGH (ref 11.5–15.5)
WBC: 17.9 10*3/uL — ABNORMAL HIGH (ref 4.0–10.5)

## 2016-06-23 LAB — URINE MICROSCOPIC-ADD ON: RBC / HPF: NONE SEEN RBC/hpf (ref 0–5)

## 2016-06-23 LAB — RETICULOCYTES
RBC.: 3.49 MIL/uL — ABNORMAL LOW (ref 3.87–5.11)
Retic Count, Absolute: 122.2 10*3/uL (ref 19.0–186.0)
Retic Ct Pct: 3.5 % — ABNORMAL HIGH (ref 0.4–3.1)

## 2016-06-23 LAB — I-STAT TROPONIN, ED: Troponin i, poc: 0 ng/mL (ref 0.00–0.08)

## 2016-06-23 MED ORDER — IOPAMIDOL (ISOVUE-370) INJECTION 76%
INTRAVENOUS | Status: AC
  Administered 2016-06-23: 55 mL
  Filled 2016-06-23: qty 100

## 2016-06-23 MED ORDER — SODIUM CHLORIDE 0.9 % IV SOLN
Freq: Once | INTRAVENOUS | Status: AC
  Administered 2016-06-23: 22:00:00 via INTRAVENOUS

## 2016-06-23 MED ORDER — ENOXAPARIN SODIUM 80 MG/0.8ML ~~LOC~~ SOLN
1.0000 mg/kg | Freq: Two times a day (BID) | SUBCUTANEOUS | Status: DC
  Filled 2016-06-23: qty 0.8

## 2016-06-23 NOTE — Progress Notes (Signed)
ANTICOAGULATION CONSULT NOTE - Initial Consult  Pharmacy Consult for enoxaparin Indication: pulmonary embolus and DVT  Allergies  Allergen Reactions  . Bee Venom Swelling    Patient Measurements: Height: 5\' 3"  (160 cm) Weight: 150 lb (68 kg) IBW/kg (Calculated) : 52.4  Vital Signs: Temp: 100.1 F (37.8 C) (07/24 2015) Temp Source: Oral (07/24 2015) BP: 152/60 (07/24 2145) Pulse Rate: 80 (07/24 2145)  Labs:  Recent Labs  06/23/16 1301  HGB 9.9*  HCT 29.2*  PLT 233  CREATININE 1.42*    Estimated Creatinine Clearance: 35.6 mL/min (by C-G formula based on SCr of 1.42 mg/dL).  Assessment: 70 yo f presenting with CP radiating to back  PMH: sickle cell (rarely has crisis), CAD, DM, HTN  AC: none pta - enoxaparin for new dvt/pe  Renal: SCr 1.42  Heme: H&H 9.9/29.2, Plt 233  Plan:  Lovenox 1 mg/kg q12h Monitor renal fx F/U plans to change to [facility]  Levester Fresh, PharmD, BCPS, [facility] Clinical Pharmacist Pager [phone] 06/23/2016 9:58 PM

## 2016-06-23 NOTE — ED Provider Notes (Signed)
[place] DEPT Provider Note   CSN: [id number] Arrival date & time: 06/23/16  1230  First Provider Contact:  None       History   Chief Complaint Chief Complaint  Patient presents with  . Chest Pain    HPI [name] is a 70 y.o. female.   This a 70 year old female with a history of sickle cell disease who states that last night she developed anterior chest pain radiating to her back.  This lasted throughout the night.  Her husband states that he massaged the area at this time.  She's not combining of any pain.  She states that she rarely has sickle cell crisis.   The history is provided by the patient.  Chest Pain   This is a new problem. The current episode started 12 to 24 hours ago. The problem occurs rarely. The problem has been resolved. The pain is associated with rest. The pain is present in the substernal region. The pain is at a severity of 9/10. The pain is severe. The quality of the pain is described as pressure-like. The pain radiates to the upper back. Pertinent negatives include no cough, no diaphoresis, no irregular heartbeat, no nausea, no numbness, no vomiting and no weakness. She has tried rest for the symptoms. The treatment provided significant relief. Risk factors: Sickle cell disease.  Her past medical history is significant for CAD, diabetes, hypertension, sickle cell disease and thyroid problem.    Past Medical History:  Diagnosis Date  . Abdominal pain   . Coronary artery disease   . Diabetes mellitus without complication ([place])   . Glaucoma   . Hypertension   . Hypothyroid   . Sickle cell anemia (HCC)     There are no active problems to display for this patient.   Past Surgical History:  Procedure Laterality Date  . CHOLECYSTECTOMY    . HIP SURGERY     6 yrs ago  . REFRACTIVE SURGERY    . RETINAL DETACHMENT SURGERY      OB History    No data available       Home Medications    Prior to Admission medications   Medication  Sig Start Date End Date Taking? Authorizing Provider  acetaminophen (TYLENOL) 500 MG tablet Take 1,000 mg by mouth every 6 (six) hours as needed for mild pain or moderate pain.     Historical Provider, MD  amLODipine-olmesartan (AZOR) 10-40 MG per tablet Take 1 tablet by mouth daily.    Historical Provider, MD  aspirin EC 81 MG tablet Take 81 mg by mouth daily.    Historical Provider, MD  atorvastatin (LIPITOR) 40 MG tablet Take 40 mg by mouth daily.    Historical Provider, MD  azithromycin (ZITHROMAX) 250 MG tablet Take 250-500 mg by mouth daily. 500 mg on day 1 then take 250 mg for 4 days. 06/12/15   Historical Provider, MD  Calcium Carb-Cholecalciferol 600-800 MG-UNIT TABS Take 1 tablet by mouth daily.    Historical Provider, MD  celecoxib (CELEBREX) 200 MG capsule Take 200 mg by mouth 2 (two) times daily.     Historical Provider, MD  cyclobenzaprine (FLEXERIL) 5 MG tablet Take 1 tablet (5 mg total) by mouth 3 (three) times daily as needed for muscle spasms. Patient not taking: Reported on 06/16/2015 03/16/15   Drenda Freeze, MD  cycloSPORINE (RESTASIS) 0.05 % ophthalmic emulsion Place 1 drop into both eyes 2 (two) times daily.    Historical Provider, MD  ferrous  sulfate 325 (65 FE) MG tablet Take 325 mg by mouth daily with breakfast.    Historical Provider, MD  folic acid (FOLVITE) 1 MG tablet Take 1 mg by mouth daily.     Historical Provider, MD  glipiZIDE (GLUCOTROL XL) 5 MG 24 hr tablet Take 5 mg by mouth daily.    Historical Provider, MD  HYDROcodone-acetaminophen (NORCO/VICODIN) 5-325 MG per tablet Take 1 tablet by mouth every 6 (six) hours as needed. 06/16/15   Varney Biles, MD  ibuprofen (ADVIL,MOTRIN) 400 MG tablet Take 1 tablet (400 mg total) by mouth every 6 (six) hours as needed. 06/16/15   Varney Biles, MD  insulin glargine (LANTUS) 100 UNIT/ML injection Inject 12 Units into the skin at bedtime.    Historical Provider, MD  levothyroxine (SYNTHROID, LEVOTHROID) 75 MCG tablet Take  75 mcg by mouth daily before breakfast.    Historical Provider, MD  metFORMIN (GLUCOPHAGE-XR) 500 MG 24 hr tablet Take 500 mg by mouth 2 (two) times daily.    Historical Provider, MD  metoprolol succinate (TOPROL-XL) 25 MG 24 hr tablet Take 25 mg by mouth every evening.    Historical Provider, MD  neomycin-bacitracin-polymyxin (NEOSPORIN) ointment Apply 1 application topically every 12 (twelve) hours. apply inside nose    Historical Provider, MD  oxyCODONE-acetaminophen (PERCOCET) 5-325 MG per tablet Take 1 tablet by mouth every 6 (six) hours as needed. Patient not taking: Reported on 06/16/2015 03/16/15   Drenda Freeze, MD  ramipril (ALTACE) 10 MG capsule Take 10 mg by mouth 2 (two) times daily.     Historical Provider, MD  sulfamethoxazole-trimethoprim (SEPTRA DS) 800-160 MG per tablet Take 1 tablet by mouth every 12 (twelve) hours. Patient not taking: Reported on 03/16/2015 05/08/13   Virgel Manifold, MD  timolol (TIMOPTIC) 0.5 % ophthalmic solution Place 1 drop into both eyes 2 (two) times daily.    Historical Provider, MD  travoprost, benzalkonium, (TRAVATAN) 0.004 % ophthalmic solution Place 1 drop into both eyes at bedtime.    Historical Provider, MD    Family History Family History  Problem Relation Age of Onset  . Diabetes Mother   . Cancer Father     Prostate  . Diabetes Father   . Cancer Brother   . Diabetes Brother     Social History Social History  Substance Use Topics  . Smoking status: Former Research scientist (life sciences)  . Smokeless tobacco: Never Used  . Alcohol use No     Allergies   Bee venom   Review of Systems Review of Systems  Constitutional: Negative for diaphoresis.  Respiratory: Negative for cough.   Cardiovascular: Positive for chest pain.  Gastrointestinal: Negative for nausea and vomiting.  Neurological: Negative for weakness and numbness.     Physical Exam Updated Vital Signs BP 152/60   Pulse 80   Temp 100.1 F (37.8 C) (Oral)   Resp 19   Ht 5\' 3"  (1.6 m)    Wt 68 kg   SpO2 98%   BMI 26.57 kg/m   Physical Exam  Constitutional: She is oriented to person, place, and time. She appears well-developed and well-nourished. No distress.  Eyes: Pupils are equal, round, and reactive to light.  Neck: Normal range of motion.  Cardiovascular: Normal rate.   Pulmonary/Chest: Effort normal and breath sounds normal. No respiratory distress.  Abdominal: Soft.  Musculoskeletal: Normal range of motion.  Neurological: She is alert and oriented to person, place, and time.  Skin: Skin is warm and dry.  Nursing note and vitals  reviewed.    ED Treatments / Results  Labs (all labs ordered are listed, but only abnormal results are displayed) Labs Reviewed  BASIC METABOLIC PANEL - Abnormal; Notable for the following:       Result Value   Glucose, Bld 196 (*)    Creatinine, Ser 1.42 (*)    GFR calc non Af Amer 37 (*)    GFR calc Af Amer 43 (*)    All other components within normal limits  CBC - Abnormal; Notable for the following:    WBC 17.9 (*)    RBC 3.55 (*)    Hemoglobin 9.9 (*)    HCT 29.2 (*)    RDW 16.8 (*)    All other components within normal limits  RETICULOCYTES - Abnormal; Notable for the following:    Retic Ct Pct 3.5 (*)    RBC. 3.49 (*)    All other components within normal limits  URINALYSIS, ROUTINE W REFLEX MICROSCOPIC (NOT AT [facility])  I-STAT TROPOININ, ED  I-STAT TROPOININ, ED  Randolm Idol, ED    EKG  EKG Interpretation None       Radiology Dg Chest 2 View  Result Date: 06/23/2016 CLINICAL DATA:  Chest pain for 2 days. EXAM: CHEST  2 VIEW COMPARISON:  02/21/2011 FINDINGS: Cardiomediastinal silhouette is normal. Mediastinal contours appear intact. Calcific atherosclerotic disease and tortuosity of the aorta are noted. There is no evidence of focal airspace consolidation, pleural effusion or pneumothorax. Minimal linear opacities in the left lower thorax likely represent atelectasis. Osseous structures are without  acute abnormality. Soft tissues are grossly normal. IMPRESSION: Minimal left lower lobe atelectasis. Otherwise no evidence of acute cardiopulmonary process. Atherosclerotic disease of the aorta. Electronically Signed   By: Fidela Salisbury M.D.   On: 06/23/2016 13:41   Procedures Procedures (including critical care time)  Medications Ordered in ED Medications  0.9 %  sodium chloride infusion ( Intravenous New Bag/Given 06/23/16 2135)     Initial Impression / Assessment and Plan / ED Course  I have reviewed the triage vital signs and the nursing notes.  Pertinent labs & imaging results that were available during my care of the patient were reviewed by me and considered in my medical decision making (see chart for details).  Clinical Course   Patient's initial set of labs drawn in the emergency department waiting room, oral within normal parameters with a negative troponin, but due to patient's history of sickle cell disease.  Will obtain reticulocyte count.  A repeat troponin and a chest CT angiogram to rule out PE   Final Clinical Impressions(s) / ED Diagnoses  Leukocytosis  New Prescriptions Current Discharge Medication List       Junius Creamer, NP 06/23/16 [place], MD 06/24/16 [phone]

## 2016-06-23 NOTE — ED Notes (Signed)
Notified Lab to "add on" reticulocytes to previously collected blood work

## 2016-06-23 NOTE — ED Triage Notes (Signed)
PT states that she also had lower abdominal pain.

## 2016-06-23 NOTE — ED Notes (Signed)
Pt given diet gingerale, peanut butter and graham crackers. Denies pain at present.

## 2016-06-23 NOTE — ED Provider Notes (Signed)
[place] DEPT Provider Note   CSN: [id number] Arrival date & time: 06/23/16  1230  First Provider Contact:  None       History   Chief Complaint Chief Complaint  Patient presents with  . Chest Pain    HPI [name] is a 70 y.o. female.   This a 70 year old female with a history of sickle cell disease who states that last night she developed anterior chest pain radiating to her back.  This lasted throughout the night.  Her husband states that he massaged the area at this time.  She's not combining of any pain.  She states that she rarely has sickle cell crisis.   The history is provided by the patient.  Chest Pain   This is a new problem. The current episode started 12 to 24 hours ago. The problem occurs rarely. The problem has been resolved. The pain is associated with rest. The pain is present in the substernal region. The pain is at a severity of 9/10. The pain is severe. The quality of the pain is described as pressure-like. The pain radiates to the upper back. Pertinent negatives include no cough, no diaphoresis, no irregular heartbeat, no nausea, no numbness, no vomiting and no weakness. She has tried rest for the symptoms. The treatment provided significant relief. Risk factors: Sickle cell disease.  Her past medical history is significant for CAD, diabetes, hypertension, sickle cell disease and thyroid problem.    Past Medical History:  Diagnosis Date  . Abdominal pain   . Coronary artery disease   . Diabetes mellitus without complication ([place])   . Glaucoma   . Hypertension   . Hypothyroid   . Sickle cell anemia (HCC)     There are no active problems to display for this patient.   Past Surgical History:  Procedure Laterality Date  . CHOLECYSTECTOMY    . HIP SURGERY     6 yrs ago  . REFRACTIVE SURGERY    . RETINAL DETACHMENT SURGERY      OB History    No data available       Home Medications    Prior to Admission medications   Medication  Sig Start Date End Date Taking? Authorizing Provider  acetaminophen (TYLENOL) 500 MG tablet Take 1,000 mg by mouth every 6 (six) hours as needed for mild pain or moderate pain.     Historical Provider, MD  amLODipine-olmesartan (AZOR) 10-40 MG per tablet Take 1 tablet by mouth daily.    Historical Provider, MD  aspirin EC 81 MG tablet Take 81 mg by mouth daily.    Historical Provider, MD  atorvastatin (LIPITOR) 40 MG tablet Take 40 mg by mouth daily.    Historical Provider, MD  azithromycin (ZITHROMAX) 250 MG tablet Take 250-500 mg by mouth daily. 500 mg on day 1 then take 250 mg for 4 days. 06/12/15   Historical Provider, MD  Calcium Carb-Cholecalciferol 600-800 MG-UNIT TABS Take 1 tablet by mouth daily.    Historical Provider, MD  celecoxib (CELEBREX) 200 MG capsule Take 200 mg by mouth 2 (two) times daily.     Historical Provider, MD  cyclobenzaprine (FLEXERIL) 5 MG tablet Take 1 tablet (5 mg total) by mouth 3 (three) times daily as needed for muscle spasms. Patient not taking: Reported on 06/16/2015 03/16/15   Drenda Freeze, MD  cycloSPORINE (RESTASIS) 0.05 % ophthalmic emulsion Place 1 drop into both eyes 2 (two) times daily.    Historical Provider, MD  ferrous  sulfate 325 (65 FE) MG tablet Take 325 mg by mouth daily with breakfast.    Historical Provider, MD  folic acid (FOLVITE) 1 MG tablet Take 1 mg by mouth daily.     Historical Provider, MD  glipiZIDE (GLUCOTROL XL) 5 MG 24 hr tablet Take 5 mg by mouth daily.    Historical Provider, MD  HYDROcodone-acetaminophen (NORCO/VICODIN) 5-325 MG per tablet Take 1 tablet by mouth every 6 (six) hours as needed. 06/16/15   Varney Biles, MD  ibuprofen (ADVIL,MOTRIN) 400 MG tablet Take 1 tablet (400 mg total) by mouth every 6 (six) hours as needed. 06/16/15   Varney Biles, MD  insulin glargine (LANTUS) 100 UNIT/ML injection Inject 12 Units into the skin at bedtime.    Historical Provider, MD  levothyroxine (SYNTHROID, LEVOTHROID) 75 MCG tablet Take  75 mcg by mouth daily before breakfast.    Historical Provider, MD  metFORMIN (GLUCOPHAGE-XR) 500 MG 24 hr tablet Take 500 mg by mouth 2 (two) times daily.    Historical Provider, MD  metoprolol succinate (TOPROL-XL) 25 MG 24 hr tablet Take 25 mg by mouth every evening.    Historical Provider, MD  neomycin-bacitracin-polymyxin (NEOSPORIN) ointment Apply 1 application topically every 12 (twelve) hours. apply inside nose    Historical Provider, MD  oxyCODONE-acetaminophen (PERCOCET) 5-325 MG per tablet Take 1 tablet by mouth every 6 (six) hours as needed. Patient not taking: Reported on 06/16/2015 03/16/15   Drenda Freeze, MD  ramipril (ALTACE) 10 MG capsule Take 10 mg by mouth 2 (two) times daily.     Historical Provider, MD  sulfamethoxazole-trimethoprim (SEPTRA DS) 800-160 MG per tablet Take 1 tablet by mouth every 12 (twelve) hours. Patient not taking: Reported on 03/16/2015 05/08/13   Virgel Manifold, MD  timolol (TIMOPTIC) 0.5 % ophthalmic solution Place 1 drop into both eyes 2 (two) times daily.    Historical Provider, MD  travoprost, benzalkonium, (TRAVATAN) 0.004 % ophthalmic solution Place 1 drop into both eyes at bedtime.    Historical Provider, MD    Family History Family History  Problem Relation Age of Onset  . Diabetes Mother   . Cancer Father     Prostate  . Diabetes Father   . Cancer Brother   . Diabetes Brother     Social History Social History  Substance Use Topics  . Smoking status: Former Research scientist (life sciences)  . Smokeless tobacco: Never Used  . Alcohol use No     Allergies   Bee venom   Review of Systems Review of Systems  Constitutional: Negative for diaphoresis.  Respiratory: Negative for cough.   Cardiovascular: Positive for chest pain.  Gastrointestinal: Negative for nausea and vomiting.  Neurological: Negative for weakness and numbness.     Physical Exam Updated Vital Signs BP 152/60   Pulse 80   Temp 100.1 F (37.8 C) (Oral)   Resp 19   Ht 5\' 3"  (1.6 m)    Wt 68 kg   SpO2 98%   BMI 26.57 kg/m   Physical Exam  Constitutional: She is oriented to person, place, and time. She appears well-developed and well-nourished. No distress.  Eyes: Pupils are equal, round, and reactive to light.  Neck: Normal range of motion.  Cardiovascular: Normal rate.   Pulmonary/Chest: Effort normal and breath sounds normal. No respiratory distress.  Abdominal: Soft.  Musculoskeletal: Normal range of motion.  Neurological: She is alert and oriented to person, place, and time.  Skin: Skin is warm and dry.  Nursing note and vitals  reviewed.    ED Treatments / Results  Labs (all labs ordered are listed, but only abnormal results are displayed) Labs Reviewed  BASIC METABOLIC PANEL - Abnormal; Notable for the following:       Result Value   Glucose, Bld 196 (*)    Creatinine, Ser 1.42 (*)    GFR calc non Af Amer 37 (*)    GFR calc Af Amer 43 (*)    All other components within normal limits  CBC - Abnormal; Notable for the following:    WBC 17.9 (*)    RBC 3.55 (*)    Hemoglobin 9.9 (*)    HCT 29.2 (*)    RDW 16.8 (*)    All other components within normal limits  RETICULOCYTES - Abnormal; Notable for the following:    Retic Ct Pct 3.5 (*)    RBC. 3.49 (*)    All other components within normal limits  URINALYSIS, ROUTINE W REFLEX MICROSCOPIC (NOT AT [facility])  I-STAT TROPOININ, ED  I-STAT TROPOININ, ED  Randolm Idol, ED    EKG  EKG Interpretation None       Radiology Dg Chest 2 View  Result Date: 06/23/2016 CLINICAL DATA:  Chest pain for 2 days. EXAM: CHEST  2 VIEW COMPARISON:  02/21/2011 FINDINGS: Cardiomediastinal silhouette is normal. Mediastinal contours appear intact. Calcific atherosclerotic disease and tortuosity of the aorta are noted. There is no evidence of focal airspace consolidation, pleural effusion or pneumothorax. Minimal linear opacities in the left lower thorax likely represent atelectasis. Osseous structures are without  acute abnormality. Soft tissues are grossly normal. IMPRESSION: Minimal left lower lobe atelectasis. Otherwise no evidence of acute cardiopulmonary process. Atherosclerotic disease of the aorta. Electronically Signed   By: Fidela Salisbury M.D.   On: 06/23/2016 13:41   Procedures Procedures (including critical care time)  Medications Ordered in ED Medications  0.9 %  sodium chloride infusion ( Intravenous New Bag/Given 06/23/16 2135)     Initial Impression / Assessment and Plan / ED Course  I have reviewed the triage vital signs and the nursing notes.  Pertinent labs & imaging results that were available during my care of the patient were reviewed by me and considered in my medical decision making (see chart for details).  Clinical Course   Patient's initial set of labs drawn in the emergency department waiting room, oral within normal parameters with a negative troponin, but due to patient's history of sickle cell disease.  Will obtain reticulocyte count.  A repeat troponin and a chest CT angiogram to rule out PE   Final Clinical Impressions(s) / ED Diagnoses  Leukocytosis Atypical chest pain  New Prescriptions Current Discharge Medication List       Junius Creamer, NP 06/23/16 2348    Dorie Rank, MD 06/24/16 [phone]

## 2016-06-23 NOTE — ED Triage Notes (Signed)
PT has history of MI.  Pt states started having chest pain yesterday with nausea. No chest pain now.

## 2016-06-23 NOTE — ED Notes (Signed)
Pt called x3 for reassessment of VS, pt not in waiting room at this time, will call agian

## 2016-06-23 NOTE — Discharge Instructions (Addendum)
You have been given an injection of Lovenox today, which is a blood thinner You've also been scheduled for a vascular Doppler of your right arm tomorrow.  At [facility] radiology department. Please call your primary care provider to discuss tonight visit and her ongoing symptoms

## 2016-09-22 ENCOUNTER — Other Ambulatory Visit: Payer: Self-pay | Admitting: Pulmonary Disease

## 2016-09-22 DIAGNOSIS — Z1231 Encounter for screening mammogram for malignant neoplasm of breast: Secondary | ICD-10-CM

## 2016-10-16 ENCOUNTER — Ambulatory Visit
Admission: RE | Admit: 2016-10-16 | Discharge: 2016-10-16 | Disposition: A | Discharge status: Home / Self Care | Payer: Medicare Other | Source: Ambulatory Visit | Attending: Pulmonary Disease | Admitting: Pulmonary Disease

## 2016-10-16 DIAGNOSIS — Z1231 Encounter for screening mammogram for malignant neoplasm of breast: Secondary | ICD-10-CM

## 2017-08-31 ENCOUNTER — Observation Stay (HOSPITAL_COMMUNITY)
Admission: EM | Admit: 2017-08-31 | Discharge: 2017-09-02 | Disposition: A | Discharge status: Home / Self Care | Payer: Medicare Other | Attending: Internal Medicine | Admitting: Internal Medicine

## 2017-08-31 ENCOUNTER — Emergency Department (HOSPITAL_COMMUNITY): Payer: Medicare Other

## 2017-08-31 ENCOUNTER — Encounter (HOSPITAL_COMMUNITY): Payer: Self-pay

## 2017-08-31 DIAGNOSIS — E119 Type 2 diabetes mellitus without complications: Secondary | ICD-10-CM

## 2017-08-31 DIAGNOSIS — I2510 Atherosclerotic heart disease of native coronary artery without angina pectoris: Secondary | ICD-10-CM | POA: Diagnosis not present

## 2017-08-31 DIAGNOSIS — J181 Lobar pneumonia, unspecified organism: Secondary | ICD-10-CM

## 2017-08-31 DIAGNOSIS — Z809 Family history of malignant neoplasm, unspecified: Secondary | ICD-10-CM | POA: Insufficient documentation

## 2017-08-31 DIAGNOSIS — Z7982 Long term (current) use of aspirin: Secondary | ICD-10-CM | POA: Diagnosis not present

## 2017-08-31 DIAGNOSIS — Z794 Long term (current) use of insulin: Secondary | ICD-10-CM | POA: Insufficient documentation

## 2017-08-31 DIAGNOSIS — Z8042 Family history of malignant neoplasm of prostate: Secondary | ICD-10-CM | POA: Insufficient documentation

## 2017-08-31 DIAGNOSIS — E1122 Type 2 diabetes mellitus with diabetic chronic kidney disease: Secondary | ICD-10-CM | POA: Insufficient documentation

## 2017-08-31 DIAGNOSIS — R0781 Pleurodynia: Secondary | ICD-10-CM | POA: Insufficient documentation

## 2017-08-31 DIAGNOSIS — N3000 Acute cystitis without hematuria: Secondary | ICD-10-CM | POA: Insufficient documentation

## 2017-08-31 DIAGNOSIS — B9620 Unspecified Escherichia coli [E. coli] as the cause of diseases classified elsewhere: Secondary | ICD-10-CM | POA: Diagnosis not present

## 2017-08-31 DIAGNOSIS — D57211 Sickle-cell/Hb-C disease with acute chest syndrome: Secondary | ICD-10-CM | POA: Diagnosis present

## 2017-08-31 DIAGNOSIS — N183 Chronic kidney disease, stage 3 unspecified: Secondary | ICD-10-CM | POA: Diagnosis present

## 2017-08-31 DIAGNOSIS — Z833 Family history of diabetes mellitus: Secondary | ICD-10-CM | POA: Diagnosis not present

## 2017-08-31 DIAGNOSIS — Z9049 Acquired absence of other specified parts of digestive tract: Secondary | ICD-10-CM | POA: Insufficient documentation

## 2017-08-31 DIAGNOSIS — I129 Hypertensive chronic kidney disease with stage 1 through stage 4 chronic kidney disease, or unspecified chronic kidney disease: Secondary | ICD-10-CM | POA: Diagnosis not present

## 2017-08-31 DIAGNOSIS — D5701 Hb-SS disease with acute chest syndrome: Secondary | ICD-10-CM

## 2017-08-31 DIAGNOSIS — R0902 Hypoxemia: Secondary | ICD-10-CM | POA: Diagnosis not present

## 2017-08-31 DIAGNOSIS — Z23 Encounter for immunization: Secondary | ICD-10-CM | POA: Diagnosis not present

## 2017-08-31 DIAGNOSIS — Z91030 Bee allergy status: Secondary | ICD-10-CM | POA: Diagnosis not present

## 2017-08-31 DIAGNOSIS — E039 Hypothyroidism, unspecified: Secondary | ICD-10-CM | POA: Diagnosis not present

## 2017-08-31 DIAGNOSIS — I10 Essential (primary) hypertension: Secondary | ICD-10-CM | POA: Diagnosis present

## 2017-08-31 DIAGNOSIS — N390 Urinary tract infection, site not specified: Secondary | ICD-10-CM | POA: Diagnosis present

## 2017-08-31 DIAGNOSIS — I700 Atherosclerosis of aorta: Secondary | ICD-10-CM | POA: Insufficient documentation

## 2017-08-31 DIAGNOSIS — Z87891 Personal history of nicotine dependence: Secondary | ICD-10-CM | POA: Diagnosis not present

## 2017-08-31 DIAGNOSIS — J189 Pneumonia, unspecified organism: Secondary | ICD-10-CM

## 2017-08-31 DIAGNOSIS — R509 Fever, unspecified: Secondary | ICD-10-CM

## 2017-08-31 DIAGNOSIS — D5720 Sickle-cell/Hb-C disease without crisis: Secondary | ICD-10-CM | POA: Diagnosis not present

## 2017-08-31 HISTORY — DX: Disorder of kidney and ureter, unspecified: N28.9

## 2017-08-31 LAB — RETICULOCYTES
RBC.: 3.54 MIL/uL — ABNORMAL LOW (ref 3.87–5.11)
Retic Count, Absolute: 173.5 10*3/uL (ref 19.0–186.0)
Retic Ct Pct: 4.9 % — ABNORMAL HIGH (ref 0.4–3.1)

## 2017-08-31 LAB — COMPREHENSIVE METABOLIC PANEL
ALT: 17 U/L (ref 14–54)
AST: 22 U/L (ref 15–41)
Albumin: 4.4 g/dL (ref 3.5–5.0)
Alkaline Phosphatase: 94 U/L (ref 38–126)
Anion gap: 9 (ref 5–15)
BUN: 29 mg/dL — ABNORMAL HIGH (ref 6–20)
CO2: 21 mmol/L — ABNORMAL LOW (ref 22–32)
Calcium: 9.5 mg/dL (ref 8.9–10.3)
Chloride: 109 mmol/L (ref 101–111)
Creatinine, Ser: 1.44 mg/dL — ABNORMAL HIGH (ref 0.44–1.00)
GFR calc Af Amer: 42 mL/min — ABNORMAL LOW (ref >60)
GFR calc non Af Amer: 36 mL/min — ABNORMAL LOW (ref >60)
Glucose, Bld: 156 mg/dL — ABNORMAL HIGH (ref 65–99)
Potassium: 4.1 mmol/L (ref 3.5–5.1)
Sodium: 139 mmol/L (ref 135–145)
Total Bilirubin: 1.3 mg/dL — ABNORMAL HIGH (ref 0.3–1.2)
Total Protein: 8.6 g/dL — ABNORMAL HIGH (ref 6.5–8.1)

## 2017-08-31 LAB — CBC WITH DIFFERENTIAL/PLATELET
Basophils Absolute: 0 10*3/uL (ref 0.0–0.1)
Basophils Relative: 0 %
Eosinophils Absolute: 0 10*3/uL (ref 0.0–0.7)
Eosinophils Relative: 0 %
HCT: 28.5 % — ABNORMAL LOW (ref 36.0–46.0)
Hemoglobin: 10.1 g/dL — ABNORMAL LOW (ref 12.0–15.0)
Lymphocytes Relative: 14 %
Lymphs Abs: 2.1 10*3/uL (ref 0.7–4.0)
MCH: 28.5 pg (ref 26.0–34.0)
MCHC: 35.4 g/dL (ref 30.0–36.0)
MCV: 80.5 fL (ref 78.0–100.0)
Monocytes Absolute: 1.6 10*3/uL — ABNORMAL HIGH (ref 0.1–1.0)
Monocytes Relative: 11 %
Neutro Abs: 11.1 10*3/uL — ABNORMAL HIGH (ref 1.7–7.7)
Neutrophils Relative %: 75 %
Platelets: 236 10*3/uL (ref 150–400)
RBC: 3.54 MIL/uL — ABNORMAL LOW (ref 3.87–5.11)
RDW: 16.9 % — ABNORMAL HIGH (ref 11.5–15.5)
WBC: 14.8 10*3/uL — ABNORMAL HIGH (ref 4.0–10.5)

## 2017-08-31 LAB — PROTIME-INR
INR: 1.08
Prothrombin Time: 13.9 seconds (ref 11.4–15.2)

## 2017-08-31 LAB — CG4 I-STAT (LACTIC ACID): Lactic Acid, Venous: 1.02 mmol/L (ref 0.5–1.9)

## 2017-08-31 MED ORDER — ONDANSETRON 4 MG PO TBDP
4.0000 mg | ORAL_TABLET | Freq: Once | ORAL | Status: AC | PRN
  Administered 2017-08-31: 4 mg via ORAL
  Filled 2017-08-31: qty 1

## 2017-08-31 MED ORDER — HYDROMORPHONE HCL 1 MG/ML IJ SOLN
0.5000 mg | Freq: Once | INTRAMUSCULAR | Status: AC
  Administered 2017-08-31: 0.5 mg via SUBCUTANEOUS
  Filled 2017-08-31: qty 1

## 2017-08-31 MED ORDER — HYDROMORPHONE HCL 1 MG/ML IJ SOLN
0.5000 mg | INTRAMUSCULAR | Status: AC
  Administered 2017-09-01: 0.5 mg via INTRAVENOUS
  Filled 2017-08-31: qty 1

## 2017-08-31 MED ORDER — DEXTROSE 5 % IV SOLN
1.0000 g | Freq: Once | INTRAVENOUS | Status: AC
  Administered 2017-09-01: 1 g via INTRAVENOUS
  Filled 2017-08-31: qty 10

## 2017-08-31 MED ORDER — SODIUM CHLORIDE 0.45 % IV SOLN
INTRAVENOUS | Status: DC
  Administered 2017-09-01 – 2017-09-02 (×3): via INTRAVENOUS

## 2017-08-31 MED ORDER — HYDROMORPHONE HCL 1 MG/ML IJ SOLN: 0.5000 mg | INTRAMUSCULAR | Status: AC

## 2017-08-31 MED ORDER — ONDANSETRON HCL 4 MG/2ML IJ SOLN
4.0000 mg | INTRAMUSCULAR | Status: DC | PRN
  Administered 2017-09-01: 4 mg via INTRAVENOUS
  Filled 2017-08-31: qty 2

## 2017-08-31 MED ORDER — DEXTROSE 5 % IV SOLN
500.0000 mg | Freq: Once | INTRAVENOUS | Status: AC
  Administered 2017-09-01: 500 mg via INTRAVENOUS
  Filled 2017-08-31: qty 500

## 2017-08-31 MED ORDER — ACETAMINOPHEN 325 MG PO TABS
650.0000 mg | ORAL_TABLET | Freq: Once | ORAL | Status: AC | PRN
  Administered 2017-08-31: 650 mg via ORAL
  Filled 2017-08-31: qty 2

## 2017-08-31 NOTE — ED Notes (Signed)
Pt c/o soreness in the torso and a fever that is consistent with sickle cell pain onset Saturday that has gradually gotten worse.

## 2017-08-31 NOTE — ED Provider Notes (Signed)
ECG interpretation: Normal sinus rhythm 87 bpm. Normal axis. Left atrial hypertrophy. Old anteroseptal myocardial infarction. Nonspecific T-wave abnormality. Normal intervals. When compared with ECG of 06/23/2016, no significant changes are seen.   Delora Fuel, MD 70/96/28 2159

## 2017-08-31 NOTE — ED Triage Notes (Signed)
Patient presents with sickle cell pain crisis, with c/o pain to bilateral hips, bilateral ribs, and bilateral arms. Patient also reports a new onset of fever today- Temperature high at home was 102F [place]. Patient endorses lethargy.

## 2017-08-31 NOTE — ED Provider Notes (Signed)
[facility] DEPT Provider Note   CSN: [id number] Arrival date & time: 08/31/17  1928     History   Chief Complaint Chief Complaint  Patient presents with  . Fever  . Sickle Cell Pain Crisis    HPI [name] is a 71 y.o. female.  The history is provided by the patient and medical records.  Fever   Associated symptoms include cough.  Sickle Cell Pain Crisis  Associated symptoms: cough and fever     71 y.o. F with hx of DM, CAD, HTN, hypothyroidism, SCA, presenting to the ED with sickle cell pain crisis and fever. Patient states she started feeling uncomfortable on Saturday evening, 2 days ago. She was having pain in her hips, ribs, and arms.  Patient states her pain was fairly well controlled at home with her hydrocodone, however developed fever today of 102F at home so she decided to be evaluated.  States she's not had any chills. She does report a productive cough with thick sputum. She denies any chest pain or shortness of breath, just pain in her ribs with coughing. No recent sick contacts. She has been taking her home medications without relief. She was given Tylenol on arrival here for fever as well as pain and nausea meds.  Rates current pain 7/10.   Past Medical History:  Diagnosis Date  . Abdominal pain   . Coronary artery disease   . Diabetes mellitus without complication ([place])   . Glaucoma   . Hypertension   . Hypothyroid   . Sickle cell anemia (HCC)     There are no active problems to display for this patient.   Past Surgical History:  Procedure Laterality Date  . CHOLECYSTECTOMY    . HIP SURGERY     6 yrs ago  . REFRACTIVE SURGERY    . RETINAL DETACHMENT SURGERY      OB History    No data available       Home Medications    Prior to Admission medications   Medication Sig Start Date End Date Taking? Authorizing Provider  acetaminophen (TYLENOL) 500 MG tablet Take 1,000 mg by mouth every 6 (six) hours as needed for mild pain or moderate  pain.     [provider]  amLODipine-olmesartan (AZOR) 10-40 MG per tablet Take 1 tablet by mouth daily.    [provider]  aspirin EC 81 MG tablet Take 81 mg by mouth daily.    [provider]  atorvastatin (LIPITOR) 40 MG tablet Take 40 mg by mouth daily.    [provider]  azithromycin (ZITHROMAX) 250 MG tablet Take 250-500 mg by mouth daily. 500 mg on day 1 then take 250 mg for 4 days. 06/12/15   [provider]  Calcium Carb-Cholecalciferol 600-800 MG-UNIT TABS Take 1 tablet by mouth daily.    [provider]  celecoxib (CELEBREX) 200 MG capsule Take 200 mg by mouth 2 (two) times daily.     [provider]  cyclobenzaprine (FLEXERIL) 5 MG tablet Take 1 tablet (5 mg total) by mouth 3 (three) times daily as needed for muscle spasms. Patient not taking: Reported on 06/16/2015 03/16/15   Drenda Freeze, MD  cycloSPORINE (RESTASIS) 0.05 % ophthalmic emulsion Place 1 drop into both eyes 2 (two) times daily.    [provider]  ferrous sulfate 325 (65 FE) MG tablet Take 325 mg by mouth daily with breakfast.    [provider]  folic acid (FOLVITE) 1 MG  tablet Take 1 mg by mouth daily.     [provider]  glipiZIDE (GLUCOTROL XL) 5 MG 24 hr tablet Take 5 mg by mouth daily.    [provider]  HYDROcodone-acetaminophen (NORCO/VICODIN) 5-325 MG per tablet Take 1 tablet by mouth every 6 (six) hours as needed. 06/16/15   Varney Biles, MD  ibuprofen (ADVIL,MOTRIN) 400 MG tablet Take 1 tablet (400 mg total) by mouth every 6 (six) hours as needed. 06/16/15   Varney Biles, MD  insulin glargine (LANTUS) 100 UNIT/ML injection Inject 12 Units into the skin at bedtime.    [provider]  levothyroxine (SYNTHROID, LEVOTHROID) 75 MCG tablet Take 75 mcg by mouth daily before breakfast.    [provider]  metFORMIN (GLUCOPHAGE-XR) 500 MG 24 hr tablet Take 500 mg by mouth 2 (two) times daily.     [provider]  metoprolol succinate (TOPROL-XL) 25 MG 24 hr tablet Take 25 mg by mouth every evening.    [provider]  neomycin-bacitracin-polymyxin (NEOSPORIN) ointment Apply 1 application topically every 12 (twelve) hours. apply inside nose    [provider]  oxyCODONE-acetaminophen (PERCOCET) 5-325 MG per tablet Take 1 tablet by mouth every 6 (six) hours as needed. Patient not taking: Reported on 06/16/2015 03/16/15   Drenda Freeze, MD  ramipril (ALTACE) 10 MG capsule Take 10 mg by mouth 2 (two) times daily.     [provider]  sulfamethoxazole-trimethoprim (SEPTRA DS) 800-160 MG per tablet Take 1 tablet by mouth every 12 (twelve) hours. Patient not taking: Reported on 03/16/2015 05/08/13   Virgel Manifold, MD  timolol (TIMOPTIC) 0.5 % ophthalmic solution Place 1 drop into both eyes 2 (two) times daily.    [provider]  travoprost, benzalkonium, (TRAVATAN) 0.004 % ophthalmic solution Place 1 drop into both eyes at bedtime.    [provider]    Family History Family History  Problem Relation Age of Onset  . Diabetes Mother   . Cancer Father        Prostate  . Diabetes Father   . Cancer Brother   . Diabetes Brother     Social History Social History  Substance Use Topics  . Smoking status: Former Research scientist (life sciences)  . Smokeless tobacco: Never Used  . Alcohol use No     Allergies   Bee venom   Review of Systems Review of Systems  Constitutional: Positive for fever.  Respiratory: Positive for cough.   Musculoskeletal: Positive for arthralgias.  All other systems reviewed and are negative.    Physical Exam Updated Vital Signs BP (!) 156/82 (BP Location: Right Arm)   Pulse 95   Temp (!) 102.8 F (39.3 C) (Oral)   Resp 15   Ht 5\' 3"  (1.6 m)   Wt 69.4 kg (153 lb)   SpO2 95%   BMI 27.10 kg/m   Physical Exam  Constitutional: She is oriented to person, place, and time. She appears well-developed and  well-nourished.  HENT:  Head: Normocephalic and atraumatic.  Right Ear: Tympanic membrane and ear canal normal.  Left Ear: Tympanic membrane and ear canal normal.  Nose: Nose normal.  Mouth/Throat: Uvula is midline, oropharynx is clear and moist and mucous membranes are normal.  Eyes: Pupils are equal, round, and reactive to light. Conjunctivae and EOM are normal.  Neck: Normal range of motion.  Cardiovascular: Normal rate, regular rhythm and normal heart sounds.   Pulmonary/Chest: Effort normal and breath sounds normal. No respiratory distress. She has no  wheezes.  Abdominal: Soft. Bowel sounds are normal. There is no tenderness. There is no rebound.  Musculoskeletal: Normal range of motion.  Neurological: She is alert and oriented to person, place, and time.  Skin: Skin is warm and dry.  Psychiatric: She has a normal mood and affect.  Nursing note and vitals reviewed.    ED Treatments / Results  Labs (all labs ordered are listed, but only abnormal results are displayed) Labs Reviewed  COMPREHENSIVE METABOLIC PANEL - Abnormal; Notable for the following:       Result Value   CO2 21 (*)    Glucose, Bld 156 (*)    BUN 29 (*)    Creatinine, Ser 1.44 (*)    Total Protein 8.6 (*)    Total Bilirubin 1.3 (*)    GFR calc non Af Amer 36 (*)    GFR calc Af Amer 42 (*)    All other components within normal limits  CBC WITH DIFFERENTIAL/PLATELET - Abnormal; Notable for the following:    WBC 14.8 (*)    RBC 3.54 (*)    Hemoglobin 10.1 (*)    HCT 28.5 (*)    RDW 16.9 (*)    Neutro Abs 11.1 (*)    Monocytes Absolute 1.6 (*)    All other components within normal limits  RETICULOCYTES - Abnormal; Notable for the following:    Retic Ct Pct 4.9 (*)    RBC. 3.54 (*)    All other components within normal limits  URINALYSIS, ROUTINE W REFLEX MICROSCOPIC - Abnormal; Notable for the following:    Protein, ur 100 (*)    Nitrite POSITIVE (*)    Leukocytes, UA MODERATE (*)    Bacteria, UA  MANY (*)    Squamous Epithelial / LPF 0-5 (*)    All other components within normal limits  CULTURE, BLOOD (ROUTINE X 2)  CULTURE, BLOOD (ROUTINE X 2)  URINE CULTURE  CULTURE, BLOOD (ROUTINE X 2)  CULTURE, BLOOD (ROUTINE X 2)  CULTURE, EXPECTORATED SPUTUM-ASSESSMENT  GRAM STAIN  PROTIME-INR  HIV ANTIBODY (ROUTINE TESTING)  STREP PNEUMONIAE URINARY ANTIGEN  CBC  BASIC METABOLIC PANEL  I-STAT CG4 LACTIC ACID, ED  CG4 I-STAT (LACTIC ACID)  I-STAT CG4 LACTIC ACID, ED    EKG  EKG Interpretation None       Radiology Dg Chest 2 View  Result Date: 08/31/2017 CLINICAL DATA:  Three day history of epigastric pain, central chest pain, and fever. EXAM: CHEST  2 VIEW COMPARISON:  06/23/2016 FINDINGS: Heart size and pulmonary vascularity are normal. There is increasing linear opacity in the left lower lung since previous study suggesting possible developing focal pneumonia. Right lung is clear. No pleural effusions. No pneumothorax. Calcification of the aorta. IMPRESSION: Developing infiltration in the left lung base may indicate pneumonia. Aortic atherosclerosis. Electronically Signed   By: Lucienne Capers M.D.   On: 08/31/2017 22:10    Procedures Procedures (including critical care time)  Medications Ordered in ED Medications  0.45 % sodium chloride infusion ( Intravenous Transfusing/Transfer 09/01/17 0346)  ondansetron (ZOFRAN) injection 4 mg (4 mg Intravenous Given 09/01/17 0341)  naloxone (NARCAN) injection 0.4 mg (not administered)    And  sodium chloride flush (NS) 0.9 % injection 9 mL (not administered)  diphenhydrAMINE (BENADRYL) injection 12.5 mg (not administered)    Or  diphenhydrAMINE (BENADRYL) 12.5 MG/5ML elixir 12.5 mg (not administered)  HYDROmorphone (DILAUDID) 1 mg/mL PCA injection (not administered)  enoxaparin (LOVENOX) injection 40 mg (not administered)  cefTRIAXone (ROCEPHIN) 1  g in dextrose 5 % 50 mL IVPB (not administered)  azithromycin (ZITHROMAX) tablet 500  mg (not administered)  aspirin EC tablet 81 mg (not administered)  acetaminophen (TYLENOL) tablet 1,000 mg (not administered)  atorvastatin (LIPITOR) tablet 40 mg (not administered)  levothyroxine (SYNTHROID, LEVOTHROID) tablet 75 mcg (not administered)  metoprolol succinate (TOPROL-XL) 24 hr tablet 50 mg (not administered)  timolol (TIMOPTIC) 0.5 % ophthalmic solution 1 drop (not administered)  latanoprost (XALATAN) 0.005 % ophthalmic solution 1 drop (not administered)  cycloSPORINE (RESTASIS) 0.05 % ophthalmic emulsion 1 drop (not administered)  calcium-vitamin D (OSCAL WITH D) 500-200 MG-UNIT per tablet 1 tablet (not administered)  ferrous sulfate tablet 325 mg (not administered)  folic acid (FOLVITE) tablet 1 mg (not administered)  insulin glargine (LANTUS) injection 6 Units (not administered)  insulin aspart (novoLOG) injection 0-15 Units (not administered)  HYDROmorphone (DILAUDID) injection 0.5 mg (0.5 mg Subcutaneous Given 08/31/17 2215)  ondansetron (ZOFRAN-ODT) disintegrating tablet 4 mg (4 mg Oral Given 08/31/17 2215)  acetaminophen (TYLENOL) tablet 650 mg (650 mg Oral Given 08/31/17 2215)  HYDROmorphone (DILAUDID) injection 0.5 mg (0.5 mg Intravenous Given 09/01/17 0058)    Or  HYDROmorphone (DILAUDID) injection 0.5 mg ( Subcutaneous See Alternative 09/01/17 0058)  cefTRIAXone (ROCEPHIN) 1 g in dextrose 5 % 50 mL IVPB (0 g Intravenous Stopped 09/01/17 0201)  azithromycin (ZITHROMAX) 500 mg in dextrose 5 % 250 mL IVPB (0 mg Intravenous Stopped 09/01/17 0326)     Initial Impression / Assessment and Plan / ED Course  I have reviewed the triage vital signs and the nursing notes.  Pertinent labs & imaging results that were available during my care of the patient were reviewed by me and considered in my medical decision making (see chart for details).  71 year old female here with fever and sickle cell pain crisis. Does report recent cough.  Denies shortness of breath. Patient febrile  here but overall nontoxic in appearance. Screening labs obtained, mild leukocytosis.  Chest x-ray with developing infiltrate in the left lung base concerning for pneumonia.  Given patient's sickle cell disease, she will require admission for acute chest syndrome.  Patient given IV fluids, started on Rocephin and azithromycin for treatment of CPAP. UA also appears infectious. Blood and urine cultures pending. Discussed with hospitalist, Dr. Alcario Drought-- he will admit for ongoing care.  Patient and husband are in agreement with care plan.  Final Clinical Impressions(s) / ED Diagnoses   Final diagnoses:  Acute chest syndrome ([place])  Fever, unspecified fever cause  Community acquired pneumonia of left lower lobe of lung [facility])    New Prescriptions Current Discharge Medication List       Kathryne Hitch 09/01/17 0403    Palumbo, April, MD 09/01/17 [phone]

## 2017-09-01 ENCOUNTER — Encounter (HOSPITAL_COMMUNITY): Payer: Self-pay | Admitting: Internal Medicine

## 2017-09-01 DIAGNOSIS — N289 Disorder of kidney and ureter, unspecified: Secondary | ICD-10-CM

## 2017-09-01 DIAGNOSIS — N183 Chronic kidney disease, stage 3 unspecified: Secondary | ICD-10-CM | POA: Diagnosis present

## 2017-09-01 DIAGNOSIS — J181 Lobar pneumonia, unspecified organism: Secondary | ICD-10-CM

## 2017-09-01 DIAGNOSIS — D57211 Sickle-cell/Hb-C disease with acute chest syndrome: Secondary | ICD-10-CM | POA: Diagnosis not present

## 2017-09-01 DIAGNOSIS — E119 Type 2 diabetes mellitus without complications: Secondary | ICD-10-CM | POA: Diagnosis not present

## 2017-09-01 DIAGNOSIS — Z794 Long term (current) use of insulin: Secondary | ICD-10-CM

## 2017-09-01 DIAGNOSIS — N390 Urinary tract infection, site not specified: Secondary | ICD-10-CM

## 2017-09-01 DIAGNOSIS — I10 Essential (primary) hypertension: Secondary | ICD-10-CM | POA: Diagnosis present

## 2017-09-01 HISTORY — DX: Disorder of kidney and ureter, unspecified: N28.9

## 2017-09-01 LAB — GLUCOSE, CAPILLARY
Glucose-Capillary: 274 mg/dL — ABNORMAL HIGH (ref 65–99)
Glucose-Capillary: 243 mg/dL — ABNORMAL HIGH (ref 65–99)
Glucose-Capillary: 298 mg/dL — ABNORMAL HIGH (ref 65–99)
Glucose-Capillary: 137 mg/dL — ABNORMAL HIGH (ref 65–99)
Glucose-Capillary: 182 mg/dL — ABNORMAL HIGH (ref 65–99)

## 2017-09-01 LAB — URINALYSIS, ROUTINE W REFLEX MICROSCOPIC
Bilirubin Urine: NEGATIVE
Glucose, UA: NEGATIVE mg/dL
Hgb urine dipstick: NEGATIVE
Ketones, ur: NEGATIVE mg/dL
Nitrite: POSITIVE — AB
Protein, ur: 100 mg/dL — AB
Specific Gravity, Urine: 1.011 (ref 1.005–1.030)
pH: 5 (ref 5.0–8.0)

## 2017-09-01 LAB — CBC
HCT: 24 % — ABNORMAL LOW (ref 36.0–46.0)
Hemoglobin: 8.5 g/dL — ABNORMAL LOW (ref 12.0–15.0)
MCH: 28.8 pg (ref 26.0–34.0)
MCHC: 35.4 g/dL (ref 30.0–36.0)
MCV: 81.4 fL (ref 78.0–100.0)
Platelets: 215 10*3/uL (ref 150–400)
RBC: 2.95 MIL/uL — ABNORMAL LOW (ref 3.87–5.11)
RDW: 16.8 % — ABNORMAL HIGH (ref 11.5–15.5)
WBC: 15.1 10*3/uL — ABNORMAL HIGH (ref 4.0–10.5)

## 2017-09-01 LAB — BASIC METABOLIC PANEL
Anion gap: 7 (ref 5–15)
BUN: 28 mg/dL — ABNORMAL HIGH (ref 6–20)
CO2: 23 mmol/L (ref 22–32)
Calcium: 8.7 mg/dL — ABNORMAL LOW (ref 8.9–10.3)
Chloride: 105 mmol/L (ref 101–111)
Creatinine, Ser: 1.38 mg/dL — ABNORMAL HIGH (ref 0.44–1.00)
GFR calc Af Amer: 44 mL/min — ABNORMAL LOW (ref >60)
GFR calc non Af Amer: 38 mL/min — ABNORMAL LOW (ref >60)
Glucose, Bld: 253 mg/dL — ABNORMAL HIGH (ref 65–99)
Potassium: 4.2 mmol/L (ref 3.5–5.1)
Sodium: 135 mmol/L (ref 135–145)

## 2017-09-01 LAB — HIV ANTIBODY (ROUTINE TESTING W REFLEX): HIV Screen 4th Generation wRfx: NONREACTIVE

## 2017-09-01 LAB — STREP PNEUMONIAE URINARY ANTIGEN: Strep Pneumo Urinary Antigen: NEGATIVE

## 2017-09-01 MED ORDER — ASPIRIN EC 81 MG PO TBEC
81.0000 mg | DELAYED_RELEASE_TABLET | Freq: Every day | ORAL | Status: DC
  Administered 2017-09-01 – 2017-09-02 (×2): 81 mg via ORAL
  Filled 2017-09-01 (×2): qty 1

## 2017-09-01 MED ORDER — HYDROMORPHONE 1 MG/ML IV SOLN
INTRAVENOUS | Status: DC
  Administered 2017-09-01: 0 mg via INTRAVENOUS
  Administered 2017-09-01: 0.5 mg via INTRAVENOUS
  Administered 2017-09-01: 0 mg via INTRAVENOUS
  Administered 2017-09-01: 0.5 mg via INTRAVENOUS
  Filled 2017-09-01: qty 25

## 2017-09-01 MED ORDER — FERROUS SULFATE 325 (65 FE) MG PO TABS
325.0000 mg | ORAL_TABLET | Freq: Every day | ORAL | Status: DC
  Administered 2017-09-01 – 2017-09-02 (×2): 325 mg via ORAL
  Filled 2017-09-01 (×2): qty 1

## 2017-09-01 MED ORDER — DEXTROSE 5 % IV SOLN
1.0000 g | INTRAVENOUS | Status: DC
  Administered 2017-09-01: 1 g via INTRAVENOUS
  Filled 2017-09-01: qty 10

## 2017-09-01 MED ORDER — TIMOLOL MALEATE 0.5 % OP SOLN
1.0000 [drp] | Freq: Two times a day (BID) | OPHTHALMIC | Status: DC
  Administered 2017-09-01 – 2017-09-02 (×3): 1 [drp] via OPHTHALMIC
  Filled 2017-09-01: qty 5

## 2017-09-01 MED ORDER — INSULIN ASPART 100 UNIT/ML ~~LOC~~ SOLN
0.0000 [IU] | Freq: Three times a day (TID) | SUBCUTANEOUS | Status: DC
  Administered 2017-09-01: 8 [IU] via SUBCUTANEOUS
  Administered 2017-09-01: 5 [IU] via SUBCUTANEOUS
  Administered 2017-09-01: 2 [IU] via SUBCUTANEOUS
  Administered 2017-09-02: 5 [IU] via SUBCUTANEOUS
  Administered 2017-09-02: 2 [IU] via SUBCUTANEOUS

## 2017-09-01 MED ORDER — OXYCODONE HCL 5 MG PO TABS
5.0000 mg | ORAL_TABLET | ORAL | Status: DC | PRN
  Administered 2017-09-01: 5 mg via ORAL
  Filled 2017-09-01: qty 1

## 2017-09-01 MED ORDER — NALOXONE HCL 0.4 MG/ML IJ SOLN: 0.4000 mg | INTRAMUSCULAR | Status: DC | PRN

## 2017-09-01 MED ORDER — ENOXAPARIN SODIUM 40 MG/0.4ML ~~LOC~~ SOLN
40.0000 mg | SUBCUTANEOUS | Status: DC
  Administered 2017-09-01 – 2017-09-02 (×2): 40 mg via SUBCUTANEOUS
  Filled 2017-09-01 (×2): qty 0.4

## 2017-09-01 MED ORDER — DIPHENHYDRAMINE HCL 50 MG/ML IJ SOLN: 12.5000 mg | Freq: Four times a day (QID) | INTRAMUSCULAR | Status: DC | PRN

## 2017-09-01 MED ORDER — METOPROLOL SUCCINATE ER 50 MG PO TB24
50.0000 mg | ORAL_TABLET | Freq: Every day | ORAL | Status: DC
  Administered 2017-09-01 – 2017-09-02 (×2): 50 mg via ORAL
  Filled 2017-09-01 (×2): qty 1

## 2017-09-01 MED ORDER — OXYCODONE HCL 5 MG PO TABS
5.0000 mg | ORAL_TABLET | Freq: Once | ORAL | Status: AC
  Administered 2017-09-01: 5 mg via ORAL
  Filled 2017-09-01: qty 1

## 2017-09-01 MED ORDER — AZITHROMYCIN 250 MG PO TABS
500.0000 mg | ORAL_TABLET | Freq: Every day | ORAL | Status: DC
  Administered 2017-09-01: 500 mg via ORAL
  Filled 2017-09-01: qty 2

## 2017-09-01 MED ORDER — DIPHENHYDRAMINE HCL 12.5 MG/5ML PO ELIX: 12.5000 mg | ORAL_SOLUTION | Freq: Four times a day (QID) | ORAL | Status: DC | PRN

## 2017-09-01 MED ORDER — ACETAMINOPHEN 500 MG PO TABS: 1000.0000 mg | ORAL_TABLET | Freq: Four times a day (QID) | ORAL | Status: DC | PRN

## 2017-09-01 MED ORDER — LEVOTHYROXINE SODIUM 75 MCG PO TABS
75.0000 ug | ORAL_TABLET | Freq: Every day | ORAL | Status: DC
  Administered 2017-09-01 – 2017-09-02 (×2): 75 ug via ORAL
  Filled 2017-09-01 (×2): qty 1

## 2017-09-01 MED ORDER — RAMIPRIL 10 MG PO CAPS: 10.0000 mg | ORAL_CAPSULE | Freq: Two times a day (BID) | ORAL | Status: DC

## 2017-09-01 MED ORDER — CALCIUM CARBONATE-VITAMIN D 500-200 MG-UNIT PO TABS
1.0000 | ORAL_TABLET | Freq: Every day | ORAL | Status: DC
  Administered 2017-09-01 – 2017-09-02 (×2): 1 via ORAL
  Filled 2017-09-01 (×2): qty 1

## 2017-09-01 MED ORDER — SODIUM CHLORIDE 0.9% FLUSH: 9.0000 mL | INTRAVENOUS | Status: DC | PRN

## 2017-09-01 MED ORDER — FOLIC ACID 1 MG PO TABS
1.0000 mg | ORAL_TABLET | Freq: Every day | ORAL | Status: DC
  Administered 2017-09-01 – 2017-09-02 (×2): 1 mg via ORAL
  Filled 2017-09-01 (×2): qty 1

## 2017-09-01 MED ORDER — INSULIN GLARGINE 100 UNIT/ML ~~LOC~~ SOLN
6.0000 [IU] | Freq: Every day | SUBCUTANEOUS | Status: DC
  Administered 2017-09-01 (×2): 6 [IU] via SUBCUTANEOUS
  Filled 2017-09-01 (×3): qty 0.06

## 2017-09-01 MED ORDER — LATANOPROST 0.005 % OP SOLN
1.0000 [drp] | Freq: Every day | OPHTHALMIC | Status: DC
  Administered 2017-09-01 (×2): 1 [drp] via OPHTHALMIC
  Filled 2017-09-01: qty 2.5

## 2017-09-01 MED ORDER — CYCLOSPORINE 0.05 % OP EMUL
1.0000 [drp] | Freq: Two times a day (BID) | OPHTHALMIC | Status: DC
  Administered 2017-09-01 – 2017-09-02 (×4): 1 [drp] via OPHTHALMIC
  Filled 2017-09-01 (×4): qty 1

## 2017-09-01 MED ORDER — ATORVASTATIN CALCIUM 40 MG PO TABS
40.0000 mg | ORAL_TABLET | Freq: Every day | ORAL | Status: DC
  Administered 2017-09-01: 40 mg via ORAL
  Filled 2017-09-01: qty 1

## 2017-09-01 MED ORDER — INFLUENZA VAC SPLIT HIGH-DOSE 0.5 ML IM SUSY
0.5000 mL | PREFILLED_SYRINGE | INTRAMUSCULAR | Status: AC
  Administered 2017-09-02: 0.5 mL via INTRAMUSCULAR
  Filled 2017-09-01: qty 0.5

## 2017-09-01 NOTE — Progress Notes (Signed)
Wasted 23mg  of IV Dilaudid with Wellsite geologist as witness

## 2017-09-01 NOTE — H&P (Addendum)
History and Physical    [name]:[id number] DOB: [birth year] DOA: 08/31/2017  PCP: Vincente Liberty, MD  Patient coming from: Home  I have personally briefly reviewed patient's old medical records in [place]  Chief Complaint: Fever, sickle cell pain crisis  HPI: [name] is a 71 y.o. female with medical history significant of HGB [place] disease with infrequent admissions for crises (usually with another medical condition present).  Patient presents to the ED withc/o pain to bilateral hips, ribs, arms.  New onset of fever up to 102 at home today (102.8 in ED).  Associated cough.  Symptoms of pain onset Sat evening.  Cough is productive with thick sputum. No CP, no SOB.   ED Course: Fever 102.8, WBC 14k, HGB 10.1.  CXR shows L lung base infiltrate.  Started on rocephin and azithromycin.   Review of Systems: As per HPI otherwise 10 point review of systems negative.   Past Medical History:  Diagnosis Date  . Abdominal pain   . Coronary artery disease   . Diabetes mellitus without complication ([place])   . Glaucoma   . Hypertension   . Hypothyroid   . Renal insufficiency 09/01/2017  . Sickle cell anemia (HCC)     Past Surgical History:  Procedure Laterality Date  . CHOLECYSTECTOMY    . HIP SURGERY     6 yrs ago  . REFRACTIVE SURGERY    . RETINAL DETACHMENT SURGERY       reports that she has quit smoking. She has never used smokeless tobacco. She reports that she does not drink alcohol or use drugs.  Allergies  Allergen Reactions  . Bee Venom Swelling    Family History  Problem Relation Age of Onset  . Diabetes Mother   . Cancer Father        Prostate  . Diabetes Father   . Cancer Brother   . Diabetes Brother      Prior to Admission medications   Medication Sig Start Date End Date Taking? Authorizing Provider  acetaminophen (TYLENOL) 500 MG tablet Take 1,000 mg by mouth every 6 (six) hours as needed for mild pain or moderate pain.    Yes  [provider]  aspirin EC 81 MG tablet Take 81 mg by mouth daily.   Yes [provider]  atorvastatin (LIPITOR) 40 MG tablet Take 40 mg by mouth daily.   Yes [provider]  Calcium Carb-Cholecalciferol 600-800 MG-UNIT TABS Take 1 tablet by mouth daily.   Yes [provider]  cycloSPORINE (RESTASIS) 0.05 % ophthalmic emulsion Place 1 drop into both eyes 2 (two) times daily.   Yes [provider]  ferrous sulfate 325 (65 FE) MG tablet Take 325 mg by mouth daily with breakfast.   Yes [provider]  folic acid (FOLVITE) 1 MG tablet Take 1 mg by mouth daily.    Yes [provider]  glipiZIDE (GLUCOTROL XL) 5 MG 24 hr tablet Take 5 mg by mouth daily.   Yes [provider]  HYDROcodone-acetaminophen (NORCO/VICODIN) 5-325 MG per tablet Take 1 tablet by mouth every 6 (six) hours as needed. Patient taking differently: Take 1 tablet by mouth every 6 (six) hours as needed for moderate pain.  06/16/15  Yes Nanavati, Ankit, MD  insulin glargine (LANTUS) 100 UNIT/ML injection Inject 12 Units into the skin at bedtime.   Yes [provider]  levothyroxine (SYNTHROID, LEVOTHROID) 75 MCG tablet Take 75 mcg by mouth daily before breakfast.  Yes [provider]  metFORMIN (GLUCOPHAGE-XR) 500 MG 24 hr tablet Take 500 mg by mouth 2 (two) times daily.   Yes [provider]  metoprolol succinate (TOPROL-XL) 50 MG 24 hr tablet Take 50 mg by mouth daily. Take with or immediately following a meal.   Yes [provider]  ramipril (ALTACE) 10 MG capsule Take 10 mg by mouth 2 (two) times daily.    Yes [provider]  timolol (TIMOPTIC) 0.5 % ophthalmic solution Place 1 drop into both eyes 2 (two) times daily.   Yes [provider]  travoprost, benzalkonium, (TRAVATAN) 0.004 % ophthalmic solution Place 1 drop into both eyes at bedtime.   Yes [provider]    Physical Exam: Vitals:    08/31/17 2025 08/31/17 2350 09/01/17 0052  BP: (!) 156/82 (!) 145/60   Pulse: 95 68   Resp: 15 19   Temp: (!) 102.8 F (39.3 C)    TempSrc: Oral    SpO2: 95% 95%   Weight: 69.4 kg (153 lb)  69.4 kg (153 lb)  Height: 5\' 3"  (1.6 m)      Constitutional: NAD, calm, comfortable Eyes: PERRL, lids and conjunctivae normal ENMT: Mucous membranes are moist. Posterior pharynx clear of any exudate or lesions.Normal dentition.  Neck: normal, supple, no masses, no thyromegaly Respiratory: clear to auscultation bilaterally, no wheezing, no crackles. Normal respiratory effort. No accessory muscle use.  Cardiovascular: Regular rate and rhythm, no murmurs / rubs / gallops. No extremity edema. 2+ pedal pulses. No carotid bruits.  Abdomen: no tenderness, no masses palpated. No hepatosplenomegaly. Bowel sounds positive.  Musculoskeletal: no clubbing / cyanosis. No joint deformity upper and lower extremities. Good ROM, no contractures. Normal muscle tone.  Skin: no rashes, lesions, ulcers. No induration Neurologic: CN 2-12 grossly intact. Sensation intact, DTR normal. Strength 5/5 in all 4.  Psychiatric: Normal judgment and insight. Alert and oriented x 3. Normal mood.    Labs on Admission: I have personally reviewed following labs and imaging studies  CBC:  Recent Labs Lab 08/31/17 2212  WBC 14.8*  NEUTROABS 11.1*  HGB 10.1*  HCT 28.5*  MCV 80.5  PLT 578   Basic Metabolic Panel:  Recent Labs Lab 08/31/17 2212  NA 139  K 4.1  CL 109  CO2 21*  GLUCOSE 156*  BUN 29*  CREATININE 1.44*  CALCIUM 9.5   GFR: Estimated Creatinine Clearance: 34.9 mL/min (A) (by C-G formula based on SCr of 1.44 mg/dL (H)). Liver Function Tests:  Recent Labs Lab 08/31/17 2212  AST 22  ALT 17  ALKPHOS 94  BILITOT 1.3*  PROT 8.6*  ALBUMIN 4.4   No results for input(s): LIPASE, AMYLASE in the last 168 hours. No results for input(s): AMMONIA in the last 168 hours. Coagulation Profile:  Recent  Labs Lab 08/31/17 2212  INR 1.08   Cardiac Enzymes: No results for input(s): CKTOTAL, CKMB, CKMBINDEX, TROPONINI in the last 168 hours. BNP (last 3 results) No results for input(s): PROBNP in the last 8760 hours. HbA1C: No results for input(s): HGBA1C in the last 72 hours. CBG: No results for input(s): GLUCAP in the last 168 hours. Lipid Profile: No results for input(s): CHOL, HDL, LDLCALC, TRIG, CHOLHDL, LDLDIRECT in the last 72 hours. Thyroid Function Tests: No results for input(s): TSH, T4TOTAL, FREET4, T3FREE, THYROIDAB in the last 72 hours. Anemia Panel:  Recent Labs  08/31/17 2212  RETICCTPCT 4.9*   Urine analysis:    Component Value Date/Time   COLORURINE YELLOW  08/31/2017 2350   APPEARANCEUR CLEAR 08/31/2017 2350   LABSPEC 1.011 08/31/2017 2350   PHURINE 5.0 08/31/2017 2350   GLUCOSEU NEGATIVE 08/31/2017 2350   HGBUR NEGATIVE 08/31/2017 2350   BILIRUBINUR NEGATIVE 08/31/2017 2350   KETONESUR NEGATIVE 08/31/2017 2350   PROTEINUR 100 (A) 08/31/2017 2350   UROBILINOGEN 1.0 02/21/2011 1127   NITRITE POSITIVE (A) 08/31/2017 2350   LEUKOCYTESUR MODERATE (A) 08/31/2017 2350    Radiological Exams on Admission: Dg Chest 2 View  Result Date: 08/31/2017 CLINICAL DATA:  Three day history of epigastric pain, central chest pain, and fever. EXAM: CHEST  2 VIEW COMPARISON:  06/23/2016 FINDINGS: Heart size and pulmonary vascularity are normal. There is increasing linear opacity in the left lower lung since previous study suggesting possible developing focal pneumonia. Right lung is clear. No pleural effusions. No pneumothorax. Calcification of the aorta. IMPRESSION: Developing infiltration in the left lung base may indicate pneumonia. Aortic atherosclerosis. Electronically Signed   By: Lucienne Capers M.D.   On: 08/31/2017 22:10    EKG: Independently reviewed.  Assessment/Plan Principal Problem:   Hemoglobin s-c disease, with acute chest syndrome (HCC) Active Problems:    DM2 (diabetes mellitus, type 2) (HCC)   Left lower lobe pneumonia ([place])   CAP (community acquired pneumonia)   HTN (hypertension)   Renal insufficiency    1. HGB [place] disease with acute chest syndrome - also UA suggestive of UTI 1. Satting 100% on room air and HGB is 10.1 so "mild" at this point 2. Obviously not transfusing at this time given elevated HGB and good O2 sats 3. Treating for CAP with rocephin and azithromycin 4. Repeat CBC in AM 5. Dilaudid PCA for pain control 6. Due to creat of 1.4 will not do NSAIDs 7. BCx and sputum cultures pending 8. UCx pending as well as it looks like she also has a UTI 2. Renal insufficiency - 1. Creat of 1.4, will assume this may be acute but I suspect this may be chronic though 2. IVF: half NS at 100 3. Repeat BMP in AM 3. Holding ACEi 4. HTN - 1. Continue home meds other than ACEi 5. DM2 - 1. Holding home PO hypoglycemics 2. Will give half home lantus (6 units QHS) 3. Will cover with mod scale SSI AC  DVT prophylaxis: Lovenox Code Status: Full Family Communication: Husband at bedside Disposition Plan: Home after admit Consults called: None Admission status: Admit to inpatient   Etta Quill DO [facility] Pager [phone]  If 7AM-7PM, please contact day team taking care of patient [email or web address] Password [email or web address]  09/01/2017, 2:02 AM

## 2017-09-01 NOTE — Progress Notes (Signed)
SICKLE CELL SERVICE PROGRESS NOTE  [name] JYN:[id number] DOB: 07-12-1946 DOA: 08/31/2017 PCP: Vincente Liberty, MD  Assessment/Plan: Principal Problem:   Hemoglobin s-c disease, with acute chest syndrome ([place]) Active Problems:   DM2 (diabetes mellitus, type 2) ([place])   HTN (hypertension)   Renal insufficiency   Acute lower UTI  1. UTI: Continue Ceftriaxone until cultures resulted. I would attribute all of her symptoms to the UTI.  2. Hb [place] with pain: Pt not in crisis. She is having pain that she states she occasionally has. Will schedule Oxycodone PRN (uses Vicodin at home). Likely discontinue PCA as she does not need it. No NSAID's due CKD.  3. Chronic Kidney Disease: Pt does not know her baseline Cr but states that she has been told by her PMD that she has kidney disease. 4. DM II: Hold Glucophage due to elevated Cr. Continue IVF for now.  5. Abnormal findings on CXR with LLL infiltrate: Pt has no symptoms of pneumonia. Not even cough except early in the morning from post-nasal drip which according to patient is normal for her. She certainly does not have Acute Chest Syndrome.   Code Status: Full Code Family Communication: N/A Disposition Plan: Not yet ready for discharge  [place].  Pager [phone]. If 7PM-7AM, please contact night-coverage.  09/01/2017, 10:49 AM  LOS: 0 days   Interim History: Pt states that she started having suprapubic pain about 3 days ago. She also had pain in the area under the breasts bilaterally. She occasionally has pain in that area which she attributes to Sickle cell but rarely takes anything for the pain. She is prescribed Vicodin to be used as needed but it is so rarely used that she cannot remember the last time she used it. Currently she reports the pain at a level of 5/10 and localized to the area under the breasts. She has used 0.5 mg on the PCA since admission.     Consultants:  None  Procedures:  None  Antibiotics:  Ceftriaxone 10/1 >>  Azithromycin 10/1 >>    Objective: Vitals:   09/01/17 0330 09/01/17 0358 09/01/17 0435 09/01/17 0810  BP: (!) 130/58 (!) 159/83    Pulse: 74 81    Resp: 18 20 18 14   Temp:  97.9 F (36.6 C)    TempSrc:  Oral    SpO2: 92% 97% 99% 98%  Weight:      Height:       Weight change:   Intake/Output Summary (Last 24 hours) at 09/01/17 1049 Last data filed at 09/01/17 0435  Gross per 24 hour  Intake            255.5 ml  Output                0 ml  Net            255.5 ml     Physical Exam General: Alert, awake, oriented x3, in no acute distress. Well appearing. HEENT: [place]/AT PEERL, EOMI, anicteric Neck: Trachea midline,  no masses, no thyromegal,y no JVD, no carotid bruit OROPHARYNX:  Moist, No exudate/ erythema/lesions.  Heart: Regular rate and rhythm, without murmurs, rubs, gallops, PMI non-displaced, no heaves or thrills on palpation.  Lungs: Clear to auscultation, no wheezing or rhonchi noted. No increased vocal fremitus resonant to percussion  Abdomen: Soft, nontender, nondistended, positive bowel sounds, no masses no hepatosplenomegaly noted.  Neuro: No focal neurological deficits noted cranial nerves II through XII grossly intact.  Strength at  baseline in bilateral upper and lower extremities. Musculoskeletal: No warmth swelling or erythema around joints, no spinal tenderness noted. Psychiatric: Patient alert and oriented x3, good insight and cognition, good recent to remote recall.     Data Reviewed: Basic Metabolic Panel:  Recent Labs Lab 08/31/17 2212 09/01/17 0630  NA 139 135  K 4.1 4.2  CL 109 105  CO2 21* 23  GLUCOSE 156* 253*  BUN 29* 28*  CREATININE 1.44* 1.38*  CALCIUM 9.5 8.7*   Liver Function Tests:  Recent Labs Lab 08/31/17 2212  AST 22  ALT 17  ALKPHOS 94  BILITOT 1.3*  PROT 8.6*  ALBUMIN 4.4   No results for input(s): LIPASE, AMYLASE in the last  168 hours. No results for input(s): AMMONIA in the last 168 hours. CBC:  Recent Labs Lab 08/31/17 2212 09/01/17 0630  WBC 14.8* 15.1*  NEUTROABS 11.1*  --   HGB 10.1* 8.5*  HCT 28.5* 24.0*  MCV 80.5 81.4  PLT 236 215   Cardiac Enzymes: No results for input(s): CKTOTAL, CKMB, CKMBINDEX, TROPONINI in the last 168 hours. BNP (last 3 results) No results for input(s): BNP in the last 8760 hours.  ProBNP (last 3 results) No results for input(s): PROBNP in the last 8760 hours.  CBG:  Recent Labs Lab 09/01/17 0422 09/01/17 0734  GLUCAP 274* 243*    No results found for this or any previous visit (from the past 240 hour(s)).   Studies: Dg Chest 2 View  Result Date: 08/31/2017 CLINICAL DATA:  Three day history of epigastric pain, central chest pain, and fever. EXAM: CHEST  2 VIEW COMPARISON:  06/23/2016 FINDINGS: Heart size and pulmonary vascularity are normal. There is increasing linear opacity in the left lower lung since previous study suggesting possible developing focal pneumonia. Right lung is clear. No pleural effusions. No pneumothorax. Calcification of the aorta. IMPRESSION: Developing infiltration in the left lung base may indicate pneumonia. Aortic atherosclerosis. Electronically Signed   By: Lucienne Capers M.D.   On: 08/31/2017 22:10    Scheduled Meds: . aspirin EC  81 mg Oral Daily  . atorvastatin  40 mg Oral q1800  . azithromycin  500 mg Oral QHS  . calcium-vitamin D  1 tablet Oral Daily  . cycloSPORINE  1 drop Both Eyes BID  . enoxaparin (LOVENOX) injection  40 mg Subcutaneous Q24H  . ferrous sulfate  325 mg Oral Q breakfast  . folic acid  1 mg Oral Daily  . HYDROmorphone   Intravenous Q4H  . [START ON 09/02/2017] Influenza vac split quadrivalent PF  0.5 mL Intramuscular Tomorrow-1000  . insulin aspart  0-15 Units Subcutaneous TID WC  . insulin glargine  6 Units Subcutaneous QHS  . latanoprost  1 drop Both Eyes QHS  . levothyroxine  75 mcg Oral QAC  breakfast  . metoprolol succinate  50 mg Oral Daily  . timolol  1 drop Both Eyes BID   Continuous Infusions: . sodium chloride 100 mL/hr at 09/01/17 0202  . cefTRIAXone (ROCEPHIN)  IV      Principal Problem:   Hemoglobin s-c disease, with acute chest syndrome (HCC) Active Problems:   DM2 (diabetes mellitus, type 2) (HCC)   Left lower lobe pneumonia ([place])   CAP (community acquired pneumonia)   HTN (hypertension)   Renal insufficiency   Acute lower UTI       In excess of __ minutes spent during this visit. Greater than 50% involved face to face contact with the patient for assessment,  counseling and coordination of care.

## 2017-09-01 NOTE — Progress Notes (Signed)
Inpatient Diabetes Program Recommendations  AACE/ADA: New Consensus Statement on Inpatient Glycemic Control (2015)  Target Ranges:  Prepandial:   less than 140 mg/dL      Peak postprandial:   less than 180 mg/dL (1-2 hours)      Critically ill patients:  140 - 180 mg/dL   Results for ARYAH, DOERING (MRN [id number]) as of 09/01/2017 11:24  Ref. Range 09/01/2017 04:22 09/01/2017 07:34 09/01/2017 11:18  Glucose-Capillary Latest Ref Range: 65 - 99 mg/dL 274 (H) 243 (H) 298 (H)    Home DM Meds: Lantus 12 units QHS        Metformin 500 mg BID        Glipizide 5 mg daily  Current Orders: Lantus 6 units QHS       Novolog Moderate Correction Scale/ SSI (0-15 units) TID AC     MD- Please consider the following in-hospital insulin adjustments while home PO Diabetes medications are on hold:  1. Increase Lantus to 12 units QHS (home dose)  2. If PO Intake OK, may consider low dose Novolog Meal Coverage as well: Novolog 3 units TID with meals (hold if pt eats <50% of meal)      --Will follow patient during hospitalization--  Wyn Quaker RN, MSN, CDE Diabetes Coordinator Inpatient Glycemic Control Team Team Pager: [phone] (8a-5p)

## 2017-09-01 NOTE — Care Management Note (Signed)
Case Management Note  Patient Details  Name: [name] MRN: [id number] Date of Birth: [birth year]  Subjective/Objective:                  Sickle cell crisis and pain  Action/Plan: Date:  September 01, 2017 Chart reviewed for concurrent status and case management needs.  Will continue to follow patient progress.  Discharge Planning: following for needs  Expected discharge date: September 04, 2017  Velva Harman, BSN, [place], [place]   Expected Discharge Date:  09/02/17               Expected Discharge Plan:  Home/Self Care  In-House Referral:     Discharge planning Services  CM Consult  Post Acute Care Choice:    Choice offered to:     DME Arranged:    DME Agency:     HH Arranged:    HH Agency:     Status of Service:  In process, will continue to follow  If discussed at Long Length of Stay Meetings, dates discussed:    Additional Comments:  Leeroy Cha, RN 09/01/2017, 9:19 AM

## 2017-09-02 DIAGNOSIS — Z794 Long term (current) use of insulin: Secondary | ICD-10-CM | POA: Diagnosis not present

## 2017-09-02 DIAGNOSIS — E119 Type 2 diabetes mellitus without complications: Secondary | ICD-10-CM | POA: Diagnosis not present

## 2017-09-02 DIAGNOSIS — I10 Essential (primary) hypertension: Secondary | ICD-10-CM | POA: Diagnosis not present

## 2017-09-02 DIAGNOSIS — N183 Chronic kidney disease, stage 3 (moderate): Secondary | ICD-10-CM

## 2017-09-02 DIAGNOSIS — D5720 Sickle-cell/Hb-C disease without crisis: Secondary | ICD-10-CM

## 2017-09-02 DIAGNOSIS — N390 Urinary tract infection, site not specified: Secondary | ICD-10-CM | POA: Diagnosis not present

## 2017-09-02 LAB — BASIC METABOLIC PANEL
Anion gap: 6 (ref 5–15)
BUN: 21 mg/dL — ABNORMAL HIGH (ref 6–20)
CO2: 23 mmol/L (ref 22–32)
Calcium: 8.7 mg/dL — ABNORMAL LOW (ref 8.9–10.3)
Chloride: 108 mmol/L (ref 101–111)
Creatinine, Ser: 1.31 mg/dL — ABNORMAL HIGH (ref 0.44–1.00)
GFR calc Af Amer: 47 mL/min — ABNORMAL LOW (ref >60)
GFR calc non Af Amer: 41 mL/min — ABNORMAL LOW (ref >60)
Glucose, Bld: 166 mg/dL — ABNORMAL HIGH (ref 65–99)
Potassium: 4.2 mmol/L (ref 3.5–5.1)
Sodium: 137 mmol/L (ref 135–145)

## 2017-09-02 LAB — GLUCOSE, CAPILLARY
Glucose-Capillary: 129 mg/dL — ABNORMAL HIGH (ref 65–99)
Glucose-Capillary: 214 mg/dL — ABNORMAL HIGH (ref 65–99)

## 2017-09-02 MED ORDER — CEPHALEXIN 500 MG PO CAPS: 500.0000 mg | ORAL_CAPSULE | Freq: Two times a day (BID) | ORAL | 0 refills | Status: DC

## 2017-09-02 MED ORDER — CEPHALEXIN 500 MG PO CAPS
500.0000 mg | ORAL_CAPSULE | Freq: Two times a day (BID) | ORAL | Status: DC
  Administered 2017-09-02: 500 mg via ORAL
  Filled 2017-09-02: qty 1

## 2017-09-02 MED ORDER — INSULIN GLARGINE 100 UNIT/ML ~~LOC~~ SOLN: 18.0000 [IU] | Freq: Every day | SUBCUTANEOUS | 11 refills | Status: AC

## 2017-09-02 NOTE — Progress Notes (Signed)
Pts IV removed with a clean and dry dressing intact. Pt denies pain at the time of discharge with no s/s of distress noted. Pt taken to the Emergency room entrance with family and nursing staff present.

## 2017-09-02 NOTE — Care Management Obs Status (Signed)
[place] NOTIFICATION   Patient Details  Name: LINNIE DELGRANDE MRN: [id number] Date of Birth: [birth year]   Medicare Observation Status Notification Given:  Yes    Leeroy Cha, RN 09/02/2017, 11:31 AM

## 2017-09-02 NOTE — Progress Notes (Addendum)
Pt ambulated in the hallway with o2 sat at 99-100% while on room air. Pulse stayed in the 60-70s

## 2017-09-02 NOTE — Discharge Summary (Signed)
[name] MRN: [id number] DOB/AGE: [birth year] 69 y.o.  Admit date: 08/31/2017 Discharge date: 09/02/2017  Primary Care Physician:  Vincente Liberty, MD   Discharge Diagnoses:   Patient Active Problem List   Diagnosis Date Noted  . Hemoglobin s-c disease, with acute chest syndrome ([place]) 09/01/2017  . DM2 (diabetes mellitus, type 2) ([place]) 09/01/2017  . HTN (hypertension) 09/01/2017  . CKD (chronic kidney disease), stage III ([place]) 09/01/2017  . Acute lower UTI 09/01/2017    DISCHARGE MEDICATION: Allergies as of 09/02/2017      Reactions   Bee Venom Swelling   Nsaids Other (See Comments)   Dyspepsia. Advised by Primary Provider to avoid because of Kidneys      Medication List    STOP taking these medications   metFORMIN 500 MG 24 hr tablet Commonly known as:  GLUCOPHAGE-XR     TAKE these medications   acetaminophen 500 MG tablet Commonly known as:  TYLENOL Take 1,000 mg by mouth every 6 (six) hours as needed for mild pain or moderate pain.   amLODipine 10 MG tablet Commonly known as:  NORVASC Take 10 mg by mouth daily.   aspirin EC 81 MG tablet Take 81 mg by mouth daily.   atorvastatin 40 MG tablet Commonly known as:  LIPITOR Take 40 mg by mouth daily.   Calcium Carb-Cholecalciferol 600-800 MG-UNIT Tabs Take 1 tablet by mouth daily.   cephALEXin 500 MG capsule Commonly known as:  KEFLEX Take 1 capsule (500 mg total) by mouth every 12 (twelve) hours.   cycloSPORINE 0.05 % ophthalmic emulsion Commonly known as:  RESTASIS Place 1 drop into both eyes 2 (two) times daily.   ferrous sulfate 325 (65 FE) MG tablet Take 325 mg by mouth daily with breakfast.   folic acid 1 MG tablet Commonly known as:  FOLVITE Take 1 mg by mouth daily.   glipiZIDE 5 MG 24 hr tablet Commonly known as:  GLUCOTROL XL Take 5 mg by mouth daily.   HYDROcodone-acetaminophen 5-325 MG tablet Commonly known as:  NORCO/VICODIN Take 1 tablet by mouth every 6 (six) hours as  needed. What changed:  reasons to take this   insulin glargine 100 UNIT/ML injection Commonly known as:  LANTUS Inject 0.18 mLs (18 Units total) into the skin at bedtime. What changed:  how much to take   levothyroxine 75 MCG tablet Commonly known as:  SYNTHROID, LEVOTHROID Take 75 mcg by mouth daily before breakfast.   losartan 100 MG tablet Commonly known as:  COZAAR Take 100 mg by mouth daily.   metoprolol succinate 50 MG 24 hr tablet Commonly known as:  TOPROL-XL Take 50 mg by mouth daily. Take with or immediately following a meal.   timolol 0.5 % ophthalmic solution Commonly known as:  TIMOPTIC Place 1 drop into both eyes 2 (two) times daily.   travoprost (benzalkonium) 0.004 % ophthalmic solution Commonly known as:  TRAVATAN Place 1 drop into both eyes at bedtime.         Consults:    SIGNIFICANT DIAGNOSTIC STUDIES:  Dg Chest 2 View  Result Date: 08/31/2017 CLINICAL DATA:  Three day history of epigastric pain, central chest pain, and fever. EXAM: CHEST  2 VIEW COMPARISON:  06/23/2016 FINDINGS: Heart size and pulmonary vascularity are normal. There is increasing linear opacity in the left lower lung since previous study suggesting possible developing focal pneumonia. Right lung is clear. No pleural effusions. No pneumothorax. Calcification of the aorta. IMPRESSION: Developing infiltration in the left lung base  may indicate pneumonia. Aortic atherosclerosis. Electronically Signed   By: Lucienne Capers M.D.   On: 08/31/2017 22:10       Recent Results (from the past 240 hour(s))  Culture, blood (Routine x 2)     Status: None (Preliminary result)   Collection Time: 08/31/17 10:13 PM  Result Value Ref Range Status   Specimen Description BLOOD RIGHT ANTECUBITAL  Final   Special Requests   Final    BOTTLES DRAWN AEROBIC AND ANAEROBIC Blood Culture adequate volume   Culture   Final    NO GROWTH < 24 HOURS Performed at [facility], [address],  [place], [place] [address]    Report Status PENDING  Incomplete  Urine culture     Status: Abnormal (Preliminary result)   Collection Time: 08/31/17 11:50 PM  Result Value Ref Range Status   Specimen Description URINE, CLEAN CATCH  Final   Special Requests NONE  Final   Culture (A)  Final    >=100,000 COLONIES/mL ESCHERICHIA COLI SUSCEPTIBILITIES TO FOLLOW Performed at [facility], [address], [place], [place] [address]    Report Status PENDING  Incomplete    BRIEF ADMITTING H & P: [name] is a 71 y.o. 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  Patient presents to the ED withc/o pain to bilateral hips, ribs, arms.  New onset of fever up to 102 at home today (102.8 in ED).  Associated cough.  Symptoms of pain onset Sat evening.  Cough is productive with thick sputum. No CP, no SOB.   Hospital Course:  Present on Admission: . Hemoglobin s-c disease, with acute chest syndrome ([place]) . HTN (hypertension) . CKD (chronic kidney disease), stage III ([place]) . Acute lower UTI  Ms. Sturtevant is an opiate naive patient with Hb [place] who was admitted with acute cystitis. She was started on ceftriaxone and urine was sent for culture. Urine culture revealed Escherichia coli susceptibilities are still pending. However the patient was transitioned to Keflex and is being discharged on Keflex 500 mg every 12 hours adjusted for her decreased renal function.  On presentation the patient is having pain in the suprapubic area. This is interpreted as the pain crisis however this was a secondary to the acute cystitis. As the infection was treated the pain resolved. The patient did have some pain under her bilateral breasts for she says she has intermittently and feels that this is associated with arthritis versus sickle cell. She normally does not require any increased medications for this. As she had stated  she only require oral medication and she IV Dilaudid was discontinued and the patient was put on oxycodone 5 mg of which she only required 2 pills while hospitalized. At the time of discharge the pain had resolved completely. The patient does have Vicodin at home that she uses on an intermittent basis.  Chronic kidney disease stage III: The patient was noted to have an elevated creatinine with a GFR of 43. We had no recent laboratory data to compare to. However I spoke with her primary care physician Dr. Vincente Liberty who confirms that her creatinine was 1.3 on 02/02/2017 so the patient has had no change in her renal function since then. I'm recommending referral to nephrology given her low GFR.  The patient does have diabetes type 2 and has been taking Lantus as well as metformin and glipizide for control of  her blood sugars. Due to her decreased GFR I have discontinued the metformin I increased her Lantus to 18 units daily instead of 12 units. She has been instructed to check her blood sugars 4 times a day before meals and at bedtime to record them and conveyed this information to her primary care physician Dr. Katherine Roan so that he can adjust the Lantus as necessary. The patient is continued on losartan for renal protection.  The patient has essential hypertension and her blood pressures well-controlled her hospitalization. She's continued on metoprolol, losartan and amlodipine as previously prescribed.  The patient was noted to have some mild hypoxemia and emergency room which I suspect was due to the effect of narcotics given for her pain. However she was weaned off oxygen almost immediately and continued to have oxygen saturation at 100% with ambulation during her hospital stay.   Disposition and Follow-up:  The patient is discharged home in good condition. I have given her the instructions regarding her metformin she verbalizes understanding. She has appointment with Dr. Katherine Roan in November.  I've advised the patient to call Dr. Sharlet Salina office and arrange an earlier appointment for post hospital follow-up. Please note I have attempted to make the arrangement today however Dr. Oralia Rud office is closed today. Patient's husband was present at the bedside during the encounter and also received instruction and verbalized understanding.  Discharge Instructions    Activity as tolerated - No restrictions    Complete by:  As directed    Diet - low sodium heart healthy    Complete by:  As directed    Diet Carb Modified    Complete by:  As directed       DISCHARGE EXAM:  General: Alert, awake, oriented x3, in no apparent distress. Well appearing HEENT: [place]/AT PEERL, EOMI, anicteric Neck: Trachea midline, no masses, no thyromegal,y no JVD, no carotid bruit OROPHARYNX: Moist, No exudate/ erythema/lesions.  Heart: Regular rate and rhythm, without murmurs, rubs, gallops or S3. PMI non-displaced. Exam reveals no decreased pulses. Pulmonary/Chest: Normal effort. Breath sounds normal. No. Apnea. Clear to auscultation,no stridor,  no wheezing and no rhonchi noted. No respiratory distress and no tenderness noted. Abdomen: Soft, nontender, nondistended, normal bowel sounds, no masses no hepatosplenomegaly noted. No fluid wave and no ascites. There is no guarding or rebound. Neuro: Alert and oriented to person, place and time. Normal motor skills, Displays no atrophy or tremors and exhibits normal muscle tone.  No focal neurological deficits noted cranial nerves II through XII grossly intact. No sensory deficit noted. Strength at baseline in bilateral upper and lower extremities. Gait normal. Musculoskeletal: No warmth swelling or erythema around joints, no spinal tenderness noted. Psychiatric: Patient alert and oriented x3, good insight and cognition, good recent to remote recall. Lymph node survey: No cervical axillary or inguinal lymphadenopathy noted. Skin: Skin is warm and dry. No  bruising, no ecchymosis and no rash noted. Pt is not diaphoretic. No erythema. No pallor   Blood pressure (!) 136/57, pulse 68, temperature 98.7 F (37.1 C), temperature source Oral, resp. rate 16, height 5\' 3"  (1.6 m), weight 69.4 kg (153 lb), SpO2 98 %.   Recent Labs  09/01/17 0630 09/02/17 1232  NA 135 137  K 4.2 4.2  CL 105 108  CO2 23 23  GLUCOSE 253* 166*  BUN 28* 21*  CREATININE 1.38* 1.31*  CALCIUM 8.7* 8.7*    Recent Labs  08/31/17 2212  AST 22  ALT 17  ALKPHOS 94  BILITOT 1.3*  PROT 8.6*  ALBUMIN 4.4   No results for input(s): LIPASE, AMYLASE in the last 72 hours.  Recent Labs  08/31/17 2212 09/01/17 0630  WBC 14.8* 15.1*  NEUTROABS 11.1*  --   HGB 10.1* 8.5*  HCT 28.5* 24.0*  MCV 80.5 81.4  PLT 236 215     Total time spent including face to face and decision making was greater than 30 minutes  Signed: Daanish Copes A. 09/02/2017, 2:28 PM

## 2017-09-02 NOTE — Care Management CC44 (Signed)
Condition Code 44 Documentation Completed  Patient Details  Name: [name] MRN: [id number] Date of Birth: Aug 16, 1946   Condition Code 44 given:  Yes Patient signature on Condition Code 44 notice:  Yes Documentation of 2 MD's agreement:  Yes Code 44 added to claim:  Yes    Leeroy Cha, RN 09/02/2017, 11:31 AM

## 2017-09-03 LAB — URINE CULTURE: Culture: >=100000 — AB

## 2017-09-05 LAB — CULTURE, BLOOD (ROUTINE X 2)
Culture: NO GROWTH
Special Requests: ADEQUATE

## 2017-09-06 LAB — CULTURE, BLOOD (ROUTINE X 2)
Culture: NO GROWTH
Culture: NO GROWTH
Special Requests: ADEQUATE
Special Requests: ADEQUATE

## 2017-09-17 NOTE — Progress Notes (Signed)
Patient ID: [name], female   DOB: [birth year], 71 y.o.   MRN: [id number]  Follow up on urine cultures shows that UTI due to E.Coli which is sensitive to 1st generation Cephalosporin. She was discharged on Keflex and there is no necessity for changes in antibiotics. Patient notified.   MATTHEWS,MICHELLE A.

## 2017-09-22 ENCOUNTER — Other Ambulatory Visit: Payer: Self-pay | Admitting: Pulmonary Disease

## 2017-09-22 DIAGNOSIS — Z1231 Encounter for screening mammogram for malignant neoplasm of breast: Secondary | ICD-10-CM

## 2017-10-21 ENCOUNTER — Ambulatory Visit
Admission: RE | Admit: 2017-10-21 | Discharge: 2017-10-21 | Disposition: A | Discharge status: Home / Self Care | Payer: Medicare Other | Source: Ambulatory Visit | Attending: Pulmonary Disease | Admitting: Pulmonary Disease

## 2017-10-21 DIAGNOSIS — Z1231 Encounter for screening mammogram for malignant neoplasm of breast: Secondary | ICD-10-CM

## 2017-10-26 ENCOUNTER — Encounter (HOSPITAL_COMMUNITY): Payer: Self-pay

## 2017-10-26 ENCOUNTER — Emergency Department (HOSPITAL_COMMUNITY): Payer: Medicare Other

## 2017-10-26 ENCOUNTER — Other Ambulatory Visit: Payer: Self-pay

## 2017-10-26 ENCOUNTER — Emergency Department (HOSPITAL_COMMUNITY)
Admission: EM | Admit: 2017-10-26 | Discharge: 2017-10-26 | Disposition: A | Discharge status: Home / Self Care | Payer: Medicare Other | Attending: Emergency Medicine | Admitting: Emergency Medicine

## 2017-10-26 DIAGNOSIS — R42 Dizziness and giddiness: Secondary | ICD-10-CM

## 2017-10-26 DIAGNOSIS — E119 Type 2 diabetes mellitus without complications: Secondary | ICD-10-CM | POA: Diagnosis not present

## 2017-10-26 DIAGNOSIS — Z79899 Other long term (current) drug therapy: Secondary | ICD-10-CM | POA: Insufficient documentation

## 2017-10-26 DIAGNOSIS — R600 Localized edema: Secondary | ICD-10-CM | POA: Diagnosis not present

## 2017-10-26 DIAGNOSIS — N183 Chronic kidney disease, stage 3 (moderate): Secondary | ICD-10-CM | POA: Diagnosis not present

## 2017-10-26 DIAGNOSIS — Z87891 Personal history of nicotine dependence: Secondary | ICD-10-CM | POA: Insufficient documentation

## 2017-10-26 DIAGNOSIS — I129 Hypertensive chronic kidney disease with stage 1 through stage 4 chronic kidney disease, or unspecified chronic kidney disease: Secondary | ICD-10-CM | POA: Diagnosis not present

## 2017-10-26 DIAGNOSIS — H81399 Other peripheral vertigo, unspecified ear: Secondary | ICD-10-CM | POA: Insufficient documentation

## 2017-10-26 DIAGNOSIS — Z7982 Long term (current) use of aspirin: Secondary | ICD-10-CM | POA: Diagnosis not present

## 2017-10-26 DIAGNOSIS — I259 Chronic ischemic heart disease, unspecified: Secondary | ICD-10-CM | POA: Insufficient documentation

## 2017-10-26 DIAGNOSIS — E039 Hypothyroidism, unspecified: Secondary | ICD-10-CM | POA: Diagnosis not present

## 2017-10-26 DIAGNOSIS — Z794 Long term (current) use of insulin: Secondary | ICD-10-CM | POA: Insufficient documentation

## 2017-10-26 LAB — CBC WITH DIFFERENTIAL/PLATELET
Basophils Absolute: 0 10*3/uL (ref 0.0–0.1)
Basophils Relative: 0 %
Eosinophils Absolute: 0.1 10*3/uL (ref 0.0–0.7)
Eosinophils Relative: 1 %
HCT: 33 % — ABNORMAL LOW (ref 36.0–46.0)
Hemoglobin: 11.6 g/dL — ABNORMAL LOW (ref 12.0–15.0)
Lymphocytes Relative: 19 %
Lymphs Abs: 1.5 10*3/uL (ref 0.7–4.0)
MCH: 28.8 pg (ref 26.0–34.0)
MCHC: 35.2 g/dL (ref 30.0–36.0)
MCV: 81.9 fL (ref 78.0–100.0)
Monocytes Absolute: 0.6 10*3/uL (ref 0.1–1.0)
Monocytes Relative: 8 %
Neutro Abs: 5.5 10*3/uL (ref 1.7–7.7)
Neutrophils Relative %: 72 %
Platelets: 236 10*3/uL (ref 150–400)
RBC: 4.03 MIL/uL (ref 3.87–5.11)
RDW: 17.2 % — ABNORMAL HIGH (ref 11.5–15.5)
WBC: 7.8 10*3/uL (ref 4.0–10.5)

## 2017-10-26 LAB — BASIC METABOLIC PANEL
Anion gap: 7 (ref 5–15)
BUN: 16 mg/dL (ref 6–20)
CO2: 24 mmol/L (ref 22–32)
Calcium: 9.6 mg/dL (ref 8.9–10.3)
Chloride: 109 mmol/L (ref 101–111)
Creatinine, Ser: 1.28 mg/dL — ABNORMAL HIGH (ref 0.44–1.00)
GFR calc Af Amer: 48 mL/min — ABNORMAL LOW (ref >60)
GFR calc non Af Amer: 42 mL/min — ABNORMAL LOW (ref >60)
Glucose, Bld: 169 mg/dL — ABNORMAL HIGH (ref 65–99)
Potassium: 4.1 mmol/L (ref 3.5–5.1)
Sodium: 140 mmol/L (ref 135–145)

## 2017-10-26 LAB — I-STAT TROPONIN, ED: Troponin i, poc: 0 ng/mL (ref 0.00–0.08)

## 2017-10-26 MED ORDER — MECLIZINE HCL 25 MG PO TABS
25.0000 mg | ORAL_TABLET | Freq: Once | ORAL | Status: AC
  Administered 2017-10-26: 25 mg via ORAL
  Filled 2017-10-26: qty 1

## 2017-10-26 MED ORDER — MECLIZINE HCL 25 MG PO TABS: 25.0000 mg | ORAL_TABLET | Freq: Three times a day (TID) | ORAL | 0 refills | Status: DC | PRN

## 2017-10-26 NOTE — ED Notes (Signed)
Pt stable, ambulatory, states understanding of discharge instructions 

## 2017-10-26 NOTE — ED Provider Notes (Signed)
Patient seen and evaluated. Discussed MRI findings. Patient describes dizziness described as an off-balance feeling for a week. Intermittent pressure and hearing changes in the left ear yesterday and today. Asymptomatic now at rest. When she sits she describes mild vertigo. No nystagmus. MRI normal. Appropriate for discharge home. We'll treat with meclizine for acute peripheral vertigo. Slightly hypertensive here, but is still pending 2 of her blood pressure medicines tonight. Primary care for not improving.   Tanna Furry, MD 10/26/17 2107

## 2017-10-26 NOTE — ED Provider Notes (Signed)
[facility] EMERGENCY DEPARTMENT Provider Note   CSN: [id number] Arrival date & time: 10/26/17  1052      History   Chief Complaint Chief Complaint  Patient presents with  . Dizziness  . Facial Swelling    HPI [name] is a 71 y.o. female.  HPI 70 year old female with a history of CAD, HTN, DM, Sickle cell anemia presenting with 1 week of constant dizziness/lightheadedness and difficulty walking.  She states that she has been unsteady on her feet requiring assistance to walk and has to hold onto things to keep her balance.  Denies numbness or weakness.  She states that this morning she woke up and felt like she had facial swelling to the left side of her face and difficulty hearing out of her left ear.  She states that this is resolved and her hearing is normal this time.  No recent fevers.  Denies chest pain, abdominal pain, vomiting, diarrhea, recent falls or head trauma.  Has never had a stroke before and never been told that she had vertigo.  She states that her symptoms mildly improved if she sits still but did not completely go away and her symptoms worsen when she stands up or tries to walk.   Past Medical History:  Diagnosis Date  . Abdominal pain   . Coronary artery disease   . Diabetes mellitus without complication ([place])   . Glaucoma   . Hypertension   . Hypothyroid   . Renal insufficiency 09/01/2017  . Sickle cell anemia [facility])     Patient Active Problem List   Diagnosis Date Noted  . 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 DM2 (diabetes mellitus, type 2) ([place]) 09/01/2017  . HTN (hypertension) 09/01/2017  . CKD (chronic kidney disease), stage III ([place]) 09/01/2017  . Acute lower UTI 09/01/2017    Past Surgical History:  Procedure Laterality Date  . CHOLECYSTECTOMY    . HIP SURGERY     6 yrs ago  . REFRACTIVE SURGERY    . RETINAL DETACHMENT SURGERY      OB History    No data available       Home  Medications    Prior to Admission medications   Medication Sig Start Date End Date Taking? Authorizing Provider  acetaminophen (TYLENOL) 500 MG tablet Take 1,000 mg by mouth every 6 (six) hours as needed for mild pain or moderate pain.    Yes [provider]  amLODipine (NORVASC) 10 MG tablet Take 10 mg by mouth daily.   Yes [provider]  aspirin EC 81 MG tablet Take 81 mg by mouth daily.   Yes [provider]  atorvastatin (LIPITOR) 40 MG tablet Take 40 mg by mouth daily.   Yes [provider]  Calcium Carb-Cholecalciferol 600-800 MG-UNIT TABS Take 1 tablet by mouth daily.   Yes [provider]  cycloSPORINE (RESTASIS) 0.05 % ophthalmic emulsion Place 1 drop into both eyes 2 (two) times daily.   Yes [provider]  ferrous sulfate 325 (65 FE) MG tablet Take 325 mg by mouth daily with breakfast.   Yes [provider]  folic acid (FOLVITE) 1 MG tablet Take 1 mg by mouth daily.    Yes [provider]  glipiZIDE (GLUCOTROL XL) 5 MG 24 hr tablet Take 5 mg by mouth daily.   Yes [provider]  HYDROcodone-acetaminophen (NORCO/VICODIN) 5-325 MG per tablet Take 1 tablet by mouth every 6 (six) hours  as needed. Patient taking differently: Take 1 tablet by mouth every 6 (six) hours as needed for moderate pain.  06/16/15  Yes Nanavati, Ankit, MD  insulin glargine (LANTUS) 100 UNIT/ML injection Inject 0.18 mLs (18 Units total) into the skin at bedtime. 09/02/17  Yes Leana Gamer, MD  levothyroxine (SYNTHROID, LEVOTHROID) 75 MCG tablet Take 75 mcg by mouth daily before breakfast.   Yes [provider]  losartan (COZAAR) 100 MG tablet Take 100 mg by mouth daily.   Yes Vincente Liberty, MD  metoprolol succinate (TOPROL-XL) 50 MG 24 hr tablet Take 50 mg by mouth daily. Take with or immediately following a meal.   Yes [provider]  timolol (TIMOPTIC) 0.5 % ophthalmic solution Place 1 drop into both  eyes 2 (two) times daily.   Yes [provider]  travoprost, benzalkonium, (TRAVATAN) 0.004 % ophthalmic solution Place 1 drop into both eyes at bedtime.   Yes [provider]  cephALEXin (KEFLEX) 500 MG capsule Take 1 capsule (500 mg total) by mouth every 12 (twelve) hours. Patient not taking: Reported on 10/26/2017 09/02/17   Leana Gamer, MD  meclizine (ANTIVERT) 25 MG tablet Take 1 tablet (25 mg total) by mouth 3 (three) times daily as needed for dizziness. 10/26/17   Maevis Mumby Mali, MD    Family History Family History  Problem Relation Age of Onset  . Diabetes Mother   . Cancer Father        Prostate  . Diabetes Father   . Cancer Brother   . Diabetes Brother   . Breast cancer Maternal Aunt        does not know age    Social History Social History   Tobacco Use  . Smoking status: Former Research scientist (life sciences)  . Smokeless tobacco: Never Used  Substance Use Topics  . Alcohol use: No  . Drug use: No     Allergies   Bee venom and Nsaids   Review of Systems Review of Systems  Constitutional: Negative for chills and fever.  HENT: Positive for facial swelling. Negative for ear pain and sore throat.   Eyes: Negative for pain and visual disturbance.  Respiratory: Negative for cough and shortness of breath.   Cardiovascular: Negative for chest pain and palpitations.  Gastrointestinal: Negative for abdominal pain and vomiting.  Genitourinary: Negative for dysuria and hematuria.  Musculoskeletal: Positive for gait problem. Negative for arthralgias and back pain.  Skin: Negative for color change and rash.  Neurological: Positive for dizziness. Negative for seizures and syncope.  All other systems reviewed and are negative.    Physical Exam Updated Vital Signs BP (!) 170/69   Pulse 65   Temp 98.7 F (37.1 C) (Oral)   Resp 19   Ht 5\' 3"  (1.6 m)   Wt 67.6 kg (149 lb)   SpO2 98%   BMI 26.39 kg/m   Physical Exam  Constitutional: She is oriented to  person, place, and time. She appears well-developed and well-nourished. No distress.  HENT:  Head: Normocephalic and atraumatic. Head is without right periorbital erythema and without left periorbital erythema.  Right Ear: Tympanic membrane normal. No swelling. No middle ear effusion.  Left Ear: Tympanic membrane normal. No swelling.  No middle ear effusion.  Mouth/Throat: Uvula is midline, oropharynx is clear and moist and mucous membranes are normal.  No facial swelling noted.  Eyes: Conjunctivae and EOM are normal. Pupils are equal, round, and reactive to light.  Neck: Normal range of motion. Neck supple.  Cardiovascular: Normal rate and regular rhythm.  No murmur heard. Pulmonary/Chest: Effort normal and breath sounds normal. No respiratory distress.  Abdominal: Soft. There is no tenderness.  Musculoskeletal: She exhibits no edema.  Neurological: She is alert and oriented to person, place, and time. She has normal reflexes.  CN 2-12 grossly intact. 5/5 strength in flexion and extension of major joints in all 4 extremities. Sensation intact throughout.  Coordination intact to finger to nose and heel to shin. Pt appears to be unsteady on feet and mildly ataxic requiring assistance to walk.  Skin: Skin is warm and dry.  Psychiatric: She has a normal mood and affect.  Nursing note and vitals reviewed.    ED Treatments / Results  Labs (all labs ordered are listed, but only abnormal results are displayed) Labs Reviewed  CBC WITH DIFFERENTIAL/PLATELET - Abnormal; Notable for the following components:      Result Value   Hemoglobin 11.6 (*)    HCT 33.0 (*)    RDW 17.2 (*)    All other components within normal limits  BASIC METABOLIC PANEL - Abnormal; Notable for the following components:   Glucose, Bld 169 (*)    Creatinine, Ser 1.28 (*)    GFR calc non Af Amer 42 (*)    GFR calc Af Amer 48 (*)    All other components within normal limits  I-STAT TROPONIN, ED    EKG  EKG  Interpretation None       Radiology Ct Head Wo Contrast  Result Date: 10/26/2017 CLINICAL DATA:  Dizziness. EXAM: CT HEAD WITHOUT CONTRAST TECHNIQUE: Contiguous axial images were obtained from the base of the skull through the vertex without intravenous contrast. COMPARISON:  None. FINDINGS: Brain: No evidence of acute infarction, hemorrhage, hydrocephalus, extra-axial collection or mass lesion/mass effect. Vascular: Atherosclerotic vascular calcification of the carotid siphons. No hyperdense vessel. Skull: Normal. Negative for fracture or focal lesion. Sinuses/Orbits: No acute finding. Other: None. IMPRESSION: No acute intracranial abnormality. Electronically Signed   By: Titus Dubin M.D.   On: 10/26/2017 12:04   Mr Brain Wo Contrast  Result Date: 10/26/2017 CLINICAL DATA:  Dizziness that started last week. Slight facial swelling on the left. EXAM: MRI HEAD WITHOUT CONTRAST TECHNIQUE: Multiplanar, multiecho pulse sequences of the brain and surrounding structures were obtained without intravenous contrast. COMPARISON:  Head CT from earlier today FINDINGS: Brain: No acute infarction, hemorrhage, hydrocephalus, extra-axial collection or mass lesion. Few FLAIR hyperintensities in the cerebral white matter, likely microvascular ischemic given patient's vascular risk factors. There is a focus of mineralization or remote hemorrhage in the medial and posterior left temporal lobe, nonspecific in isolation. Vascular: Major flow voids are preserved, including vertebral and basilar. Skull and upper cervical spine: Mildly heterogeneous marrow. Patient has history of hemoglobin [place] disease and this may be reactive to anemia. Sinuses/Orbits: Bilateral cataract resection. No inflammation including sinusitis to explain left facial swelling. IMPRESSION: 1. No acute finding or explanation for dizziness. 2. No explanation for left facial swelling; no sinusitis. Electronically Signed   By: Monte Fantasia M.D.   On:  10/26/2017 20:41    Procedures Procedures (including critical care time)  Medications Ordered in ED Medications  meclizine (ANTIVERT) tablet 25 mg (not administered)     Initial Impression / Assessment and Plan / ED Course  I have reviewed the triage vital signs and the nursing notes.  Pertinent labs & imaging results that were available during my care of the patient were reviewed by me and considered in  my medical decision making (see chart for details).     31yoF with hx of DM, HTN, hypothyroidism, sickle cell anemia presenting with 1 week of constant dizziness and lightheadedness.  She is afebrile hemodynamically stable.  Denies headache, chest pain, abdominal pain or shortness of breath.  Exam notable for unsteadiness on her feet and ataxia requiring assistance to walk.  Otherwise normal neuro exam.  No signs of meningismus.  CT head ordered from triage with no acute abnormality.  Troponin negative.  CBC unremarkable BMP with a creatinine of 1.2 which appears to be her baseline. EKG obtained and shows inverted T waves in the lateral leads however patient has a history of CAD.  As she does not have any chest pain or shortness of breath, low suspicion for ACS however will trend troponin.  Although CT head is negative, concern for posterior CVA due to her constant dizziness and ataxia.  MRI brain ordered.  Has not had any fevers and is afebrile here, doubt meningitis encephalitis.  MRI with no acute stroke.  As the patient had some stuffiness in her left ear and altered hearing, likely peripheral vertigo.  Patient still mildly hypertensive she has not had her to nighttime hypertensive medications.  We will have the patient take this when she gets home.  Will treat with meclizine for peripheral vertigo and have her follow-up with her PCP in 3-4 days for reevaluation.  Strict return precautions given.  Final Clinical Impressions(s) / ED Diagnoses   Final diagnoses:  Vertigo    ED  Discharge Orders        Ordered    meclizine (ANTIVERT) 25 MG tablet  3 times daily PRN     10/26/17 2108       Margaree Sandhu Mali, MD 10/26/17 2113    Tanna Furry, MD 11/03/17 [phone]

## 2017-10-26 NOTE — ED Notes (Signed)
Patient transported to MRI 

## 2017-10-26 NOTE — Discharge Instructions (Signed)
Please take the meclizine as needed for dizziness.  Return for numbness, weakness, headaches, worsening dizziness or other concerning symptoms.  Please take the nighttime blood pressure medicines that you normally take when you get home.

## 2017-10-26 NOTE — ED Triage Notes (Signed)
Per Pt, pt is coming from home with complaints of dizziness that started last week. Then, pt noted to have slight facial swelling on the left side of her face effecting her hearing. Denies unilateral weakness, blurred vision or double vision, confusion, or aphasia.

## 2018-04-20 ENCOUNTER — Ambulatory Visit (HOSPITAL_COMMUNITY)
Admission: RE | Admit: 2018-04-20 | Discharge: 2018-04-20 | Disposition: A | Discharge status: Home / Self Care | Payer: Medicare Other | Source: Ambulatory Visit | Attending: Pulmonary Disease | Admitting: Pulmonary Disease

## 2018-04-20 ENCOUNTER — Other Ambulatory Visit (HOSPITAL_COMMUNITY): Payer: Self-pay | Admitting: Pulmonary Disease

## 2018-04-20 DIAGNOSIS — M79674 Pain in right toe(s): Secondary | ICD-10-CM | POA: Diagnosis present

## 2018-04-20 DIAGNOSIS — M79641 Pain in right hand: Secondary | ICD-10-CM | POA: Insufficient documentation

## 2018-05-29 ENCOUNTER — Emergency Department (HOSPITAL_COMMUNITY)
Admission: EM | Admit: 2018-05-29 | Discharge: 2018-05-30 | Disposition: A | Discharge status: Home / Self Care | Payer: Medicare Other | Attending: Emergency Medicine | Admitting: Emergency Medicine

## 2018-05-29 ENCOUNTER — Encounter (HOSPITAL_COMMUNITY): Payer: Self-pay

## 2018-05-29 ENCOUNTER — Other Ambulatory Visit: Payer: Self-pay

## 2018-05-29 ENCOUNTER — Emergency Department (HOSPITAL_COMMUNITY): Payer: Medicare Other

## 2018-05-29 DIAGNOSIS — I10 Essential (primary) hypertension: Secondary | ICD-10-CM | POA: Diagnosis not present

## 2018-05-29 DIAGNOSIS — E119 Type 2 diabetes mellitus without complications: Secondary | ICD-10-CM | POA: Insufficient documentation

## 2018-05-29 DIAGNOSIS — D5700 Hb-SS disease with crisis, unspecified: Secondary | ICD-10-CM

## 2018-05-29 DIAGNOSIS — D571 Sickle-cell disease without crisis: Secondary | ICD-10-CM | POA: Insufficient documentation

## 2018-05-29 DIAGNOSIS — Z794 Long term (current) use of insulin: Secondary | ICD-10-CM | POA: Insufficient documentation

## 2018-05-29 DIAGNOSIS — Z87891 Personal history of nicotine dependence: Secondary | ICD-10-CM | POA: Insufficient documentation

## 2018-05-29 DIAGNOSIS — I2510 Atherosclerotic heart disease of native coronary artery without angina pectoris: Secondary | ICD-10-CM | POA: Diagnosis not present

## 2018-05-29 DIAGNOSIS — Z7982 Long term (current) use of aspirin: Secondary | ICD-10-CM | POA: Diagnosis not present

## 2018-05-29 DIAGNOSIS — M79603 Pain in arm, unspecified: Secondary | ICD-10-CM | POA: Diagnosis present

## 2018-05-29 DIAGNOSIS — Z79899 Other long term (current) drug therapy: Secondary | ICD-10-CM | POA: Insufficient documentation

## 2018-05-29 LAB — CBC WITH DIFFERENTIAL/PLATELET
Basophils Absolute: 0 10*3/uL (ref 0.0–0.1)
Basophils Relative: 0 %
Eosinophils Absolute: 0.1 10*3/uL (ref 0.0–0.7)
Eosinophils Relative: 1 %
HCT: 31.2 % — ABNORMAL LOW (ref 36.0–46.0)
Hemoglobin: 11 g/dL — ABNORMAL LOW (ref 12.0–15.0)
Lymphocytes Relative: 21 %
Lymphs Abs: 2.3 10*3/uL (ref 0.7–4.0)
MCH: 29 pg (ref 26.0–34.0)
MCHC: 35.3 g/dL (ref 30.0–36.0)
MCV: 82.3 fL (ref 78.0–100.0)
Monocytes Absolute: 0.8 10*3/uL (ref 0.1–1.0)
Monocytes Relative: 7 %
Neutro Abs: 7.4 10*3/uL (ref 1.7–7.7)
Neutrophils Relative %: 71 %
Platelets: 291 10*3/uL (ref 150–400)
RBC: 3.79 MIL/uL — ABNORMAL LOW (ref 3.87–5.11)
RDW: 17.4 % — ABNORMAL HIGH (ref 11.5–15.5)
WBC: 10.5 10*3/uL (ref 4.0–10.5)

## 2018-05-29 LAB — COMPREHENSIVE METABOLIC PANEL
ALT: 20 U/L (ref 0–44)
AST: 31 U/L (ref 15–41)
Albumin: 4.9 g/dL (ref 3.5–5.0)
Alkaline Phosphatase: 115 U/L (ref 38–126)
Anion gap: 7 (ref 5–15)
BUN: 33 mg/dL — ABNORMAL HIGH (ref 8–23)
CO2: 23 mmol/L (ref 22–32)
Calcium: 10 mg/dL (ref 8.9–10.3)
Chloride: 113 mmol/L — ABNORMAL HIGH (ref 98–111)
Creatinine, Ser: 1.59 mg/dL — ABNORMAL HIGH (ref 0.44–1.00)
GFR calc Af Amer: 37 mL/min — ABNORMAL LOW (ref >60)
GFR calc non Af Amer: 32 mL/min — ABNORMAL LOW (ref >60)
Glucose, Bld: 204 mg/dL — ABNORMAL HIGH (ref 70–99)
Potassium: 4.3 mmol/L (ref 3.5–5.1)
Sodium: 143 mmol/L (ref 135–145)
Total Bilirubin: 1.2 mg/dL (ref 0.3–1.2)
Total Protein: 9 g/dL — ABNORMAL HIGH (ref 6.5–8.1)

## 2018-05-29 LAB — RETICULOCYTES
RBC.: 3.79 MIL/uL — ABNORMAL LOW (ref 3.87–5.11)
Retic Count, Absolute: 170.6 10*3/uL (ref 19.0–186.0)
Retic Ct Pct: 4.5 % — ABNORMAL HIGH (ref 0.4–3.1)

## 2018-05-29 MED ORDER — HYDROMORPHONE HCL 1 MG/ML IJ SOLN: 0.5000 mg | INTRAMUSCULAR | Status: AC

## 2018-05-29 MED ORDER — HYDROMORPHONE HCL 1 MG/ML IJ SOLN: 1.0000 mg | INTRAMUSCULAR | Status: AC

## 2018-05-29 MED ORDER — HYDROMORPHONE HCL 1 MG/ML IJ SOLN
1.0000 mg | INTRAMUSCULAR | Status: AC
  Administered 2018-05-30: 1 mg via INTRAVENOUS

## 2018-05-29 MED ORDER — ONDANSETRON HCL 4 MG/2ML IJ SOLN
4.0000 mg | INTRAMUSCULAR | Status: DC | PRN
  Administered 2018-05-29: 4 mg via INTRAVENOUS
  Filled 2018-05-29: qty 2

## 2018-05-29 MED ORDER — HYDROMORPHONE HCL 1 MG/ML IJ SOLN
1.0000 mg | INTRAMUSCULAR | Status: AC
  Filled 2018-05-29: qty 1

## 2018-05-29 MED ORDER — HYDROMORPHONE HCL 1 MG/ML IJ SOLN
0.5000 mg | INTRAMUSCULAR | Status: AC
  Administered 2018-05-29: 0.5 mg via INTRAVENOUS
  Filled 2018-05-29: qty 1

## 2018-05-29 MED ORDER — DEXTROSE-NACL 5-0.45 % IV SOLN
INTRAVENOUS | Status: DC
  Administered 2018-05-29: 125 mL/h via INTRAVENOUS

## 2018-05-29 NOTE — ED Provider Notes (Signed)
[facility] DEPT Provider Note  CSN: [id number] Arrival date & time: 05/29/18 2151  Chief Complaint(s) Sickle Cell Pain Crisis  HPI [name] is a 72 y.o. female   The history is provided by the patient.  Sickle Cell Pain Crisis  Location:  Upper extremity Severity:  Moderate Onset quality:  Gradual Duration:  9 hours Similar to previous crisis episodes: yes   Timing:  Constant Progression:  Worsening Chronicity:  Recurrent History of pulmonary emboli: no   Context: not alcohol consumption, not change in medication, not dehydration, not infection and not non-compliance   Relieved by:  Nothing Worsened by:  Activity and movement Associated symptoms: no chest pain, no congestion, no cough, no fatigue, no fever, no headaches, no nausea, no vision change, no vomiting and no wheezing    H/o avascular necrosis of the right shoulder.   Past Medical History Past Medical History:  Diagnosis Date  . Abdominal pain   . Coronary artery disease   . Diabetes mellitus without complication ([place])   . Glaucoma   . Hypertension   . Hypothyroid   . Renal insufficiency 09/01/2017  . Sickle cell anemia [facility])    Patient Active Problem List   Diagnosis Date Noted  . 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 DM2 (diabetes mellitus, type 2) ([place]) 09/01/2017  . HTN (hypertension) 09/01/2017  . CKD (chronic kidney disease), stage III ([place]) 09/01/2017  . Acute lower UTI 09/01/2017   Home Medication(s) Prior to Admission medications   Medication Sig Start Date End Date Taking? Authorizing Provider  acetaminophen (TYLENOL) 500 MG tablet Take 1,000 mg by mouth every 6 (six) hours as needed for mild pain or moderate pain.     [provider]  amLODipine (NORVASC) 10 MG tablet Take 10 mg by mouth daily.    [provider]  aspirin EC 81 MG tablet Take 81 mg by mouth daily.    [provider]  atorvastatin  (LIPITOR) 40 MG tablet Take 40 mg by mouth daily.    [provider]  Calcium Carb-Cholecalciferol 600-800 MG-UNIT TABS Take 1 tablet by mouth daily.    [provider]  cephALEXin (KEFLEX) 500 MG capsule Take 1 capsule (500 mg total) by mouth every 12 (twelve) hours. Patient not taking: Reported on 10/26/2017 09/02/17   Leana Gamer, MD  cycloSPORINE (RESTASIS) 0.05 % ophthalmic emulsion Place 1 drop into both eyes 2 (two) times daily.    [provider]  ferrous sulfate 325 (65 FE) MG tablet Take 325 mg by mouth daily with breakfast.    [provider]  folic acid (FOLVITE) 1 MG tablet Take 1 mg by mouth daily.     [provider]  glipiZIDE (GLUCOTROL XL) 5 MG 24 hr tablet Take 5 mg by mouth daily.    [provider]  HYDROcodone-acetaminophen (NORCO/VICODIN) 5-325 MG per tablet Take 1 tablet by mouth every 6 (six) hours as needed. Patient taking differently: Take 1 tablet by mouth every 6 (six) hours as needed for moderate pain.  06/16/15   Varney Biles, MD  insulin glargine (LANTUS) 100 UNIT/ML injection Inject 0.18 mLs (18 Units total) into the skin at bedtime. 09/02/17   Leana Gamer, MD  levothyroxine (SYNTHROID, LEVOTHROID) 75 MCG tablet Take 75 mcg by mouth daily before breakfast.    [provider]  losartan (COZAAR) 100 MG tablet Take 100 mg by mouth daily.    Vincente Liberty, MD  meclizine (ANTIVERT) 25 MG tablet Take 1 tablet (25 mg total) by mouth 3 (three) times daily as needed for dizziness. 10/26/17   Page, Nathan Mali, MD  metoprolol succinate (TOPROL-XL) 50 MG 24 hr tablet Take 50 mg by mouth daily. Take with or immediately following a meal.    [provider]  timolol (TIMOPTIC) 0.5 % ophthalmic solution Place 1 drop into both eyes 2 (two) times daily.    [provider]  travoprost, benzalkonium, (TRAVATAN) 0.004 % ophthalmic solution Place 1 drop into both eyes at bedtime.     [provider]                                                                                                                                    Past Surgical History Past Surgical History:  Procedure Laterality Date  . CHOLECYSTECTOMY    . HIP SURGERY     6 yrs ago  . REFRACTIVE SURGERY    . RETINAL DETACHMENT SURGERY     Family History Family History  Problem Relation Age of Onset  . Diabetes Mother   . Cancer Father        Prostate  . Diabetes Father   . Cancer Brother   . Diabetes Brother   . Breast cancer Maternal Aunt        does not know age    Social History Social History   Tobacco Use  . Smoking status: Former Research scientist (life sciences)  . Smokeless tobacco: Never Used  Substance Use Topics  . Alcohol use: No  . Drug use: No   Allergies Bee venom and Nsaids  Review of Systems Review of Systems  Constitutional: Negative for fatigue and fever.  HENT: Negative for congestion.   Respiratory: Negative for cough and wheezing.   Cardiovascular: Negative for chest pain.  Gastrointestinal: Negative for nausea and vomiting.  Neurological: Negative for headaches.   All other systems are reviewed and are negative for acute change except as noted in the HPI  Physical Exam Vital Signs  I have reviewed the triage vital signs BP (!) 175/96 (BP Location: Left Arm)   Pulse (!) 58   Temp 98.7 F (37.1 C) (Oral)   Resp 18   Ht 5\' 3"  (1.6 m)   Wt 68.5 kg (151 lb)   SpO2 99%   BMI 26.75 kg/m   Physical Exam  Constitutional: She is oriented to person, place, and time. She appears well-developed and well-nourished. No distress.  HENT:  Head: Normocephalic and atraumatic.  Nose: Nose normal.  Eyes: Pupils are equal, round, and reactive to light. Conjunctivae and EOM are normal. Right eye exhibits no discharge. Left eye exhibits no discharge. No scleral icterus.  Neck: Normal range of motion. Neck supple.  Cardiovascular: Normal rate and regular rhythm. Exam reveals no  gallop and no friction rub.  No murmur heard. Pulmonary/Chest: Effort normal and breath sounds normal. No stridor. No  respiratory distress. She has no rales.  Abdominal: Soft. She exhibits no distension. There is no tenderness.  Musculoskeletal: She exhibits no edema.       Right shoulder: She exhibits tenderness and bony tenderness. She exhibits no swelling, no effusion, no crepitus, no deformity, no spasm, normal pulse and normal strength.  Neurological: She is alert and oriented to person, place, and time.  Skin: Skin is warm and dry. No rash noted. She is not diaphoretic. No erythema.  Psychiatric: She has a normal mood and affect.  Vitals reviewed.   ED Results and Treatments Labs (all labs ordered are listed, but only abnormal results are displayed) Labs Reviewed  COMPREHENSIVE METABOLIC PANEL - Abnormal; Notable for the following components:      Result Value   Chloride 113 (*)    Glucose, Bld 204 (*)    BUN 33 (*)    Creatinine, Ser 1.59 (*)    Total Protein 9.0 (*)    GFR calc non Af Amer 32 (*)    GFR calc Af Amer 37 (*)    All other components within normal limits  CBC WITH DIFFERENTIAL/PLATELET - Abnormal; Notable for the following components:   RBC 3.79 (*)    Hemoglobin 11.0 (*)    HCT 31.2 (*)    RDW 17.4 (*)    All other components within normal limits  RETICULOCYTES - Abnormal; Notable for the following components:   Retic Ct Pct 4.5 (*)    RBC. 3.79 (*)    All other components within normal limits                                                                                                                         EKG  EKG Interpretation  Date/Time:  Saturday May 29 2018 23:22:17 EDT Ventricular Rate:  58 PR Interval:    QRS Duration: 96 QT Interval:  398 QTC Calculation: 391 R Axis:   72 Text Interpretation:  Sinus rhythm Left atrial enlargement Anteroseptal infarct, old Borderline repolarization abnormality Baseline wander in lead(s) V2 V4 No  significant change since last tracing Confirmed by Addison Lank [phone]) on 05/30/2018 12:24:54 AM      Radiology Dg Chest Port 1 View  Result Date: 05/29/2018 CLINICAL DATA:  Chest pain.  History of sickle cell disease. EXAM: PORTABLE CHEST 1 VIEW COMPARISON:  Chest x-ray dated August 31, 2017. FINDINGS: The heart size and mediastinal contours are within normal limits. Normal pulmonary vascularity. No focal consolidation, pleural effusion, or pneumothorax. Increased sclerosis of both humeral heads, unchanged. IMPRESSION: 1. No active disease. 2. Increased sclerosis of both humeral heads, consistent with avascular necrosis. Electronically Signed   By: Titus Dubin M.D.   On: 05/29/2018 23:38   Pertinent labs & imaging results that were available during my care of the patient were reviewed by me and considered in my medical decision making (see chart for details).  Medications Ordered in ED Medications  dextrose 5 %-0.45 % sodium chloride  infusion (125 mL/hr Intravenous New Bag/Given 05/29/18 2349)  HYDROmorphone (DILAUDID) injection 1 mg (has no administration in time range)    Or  HYDROmorphone (DILAUDID) injection 1 mg (has no administration in time range)  ondansetron (ZOFRAN) injection 4 mg (4 mg Intravenous Given 05/29/18 2350)  HYDROmorphone (DILAUDID) injection 0.5 mg (0.5 mg Intravenous Given 05/29/18 2350)    Or  HYDROmorphone (DILAUDID) injection 0.5 mg ( Subcutaneous See Alternative 05/29/18 2350)  HYDROmorphone (DILAUDID) injection 1 mg (1 mg Intravenous Given 05/30/18 0015)    Or  HYDROmorphone (DILAUDID) injection 1 mg ( Subcutaneous See Alternative 05/30/18 0015)                                                                                                                                    Procedures Procedures  (including critical care time)  Medical Decision Making / ED Course I have reviewed the nursing notes for this encounter and the patient's prior records (if  available in EHR or on provided paperwork).    Typical sickle cell pain also related to avascular necrosis of the right shoulder.  Patient unable to control her pain with home medications.  Denies any recent fevers or infections.  Here patient is afebrile with stable vital signs.  She is well-appearing.  She denies any chest pain or shortness of breath.  No coughing.  No nausea or vomiting.  Labs grossly reassuring close to patient's baseline and without evidence of hemolytic or aplastic crisis.  Symptoms not consistent with acute chest.  EKG reassuring without acute changes.  Doubt cardiac etiology or pulmonary embolism.  Patient treated with IV fluids and pain medication.  She received 2 doses of IV Dilaudid resulting in complete resolution of her pain.  Patient did not need a third dose and feels able to be discharged.  The patient appears reasonably screened and/or stabilized for discharge and I doubt any other medical condition or other [facility] requiring further screening, evaluation, or treatment in the ED at this time prior to discharge.  The patient is safe for discharge with strict return precautions.   Final Clinical Impression(s) / ED Diagnoses Final diagnoses:  Sickle cell anemia with pain ([place])    Disposition: Discharge  Condition: Good  I have discussed the results, Dx and Tx plan with the patient who expressed understanding and agree(s) with the plan. Discharge instructions discussed at great length. The patient was given strict return precautions who verbalized understanding of the instructions. No further questions at time of discharge.    ED Discharge Orders    None       Follow Up: Vincente Liberty, [place] [address] [phone]  Schedule an appointment as soon as possible for a visit  As needed     This chart was dictated using voice recognition software.  Despite best efforts to proofread,  errors can occur which can change  the documentation meaning.  Fatima Blank, MD 05/30/18 [phone]

## 2018-05-29 NOTE — ED Notes (Addendum)
Patient's weight is 65.8kg

## 2018-05-29 NOTE — ED Triage Notes (Signed)
States sickle cell pain since 1400 pm pain medication at home is not working.

## 2018-05-30 DIAGNOSIS — D571 Sickle-cell disease without crisis: Secondary | ICD-10-CM | POA: Diagnosis not present

## 2018-09-22 ENCOUNTER — Other Ambulatory Visit: Payer: Self-pay | Admitting: Pulmonary Disease

## 2018-09-22 DIAGNOSIS — Z1231 Encounter for screening mammogram for malignant neoplasm of breast: Secondary | ICD-10-CM

## 2018-11-03 ENCOUNTER — Ambulatory Visit
Admission: RE | Admit: 2018-11-03 | Discharge: 2018-11-03 | Disposition: A | Discharge status: Home / Self Care | Payer: Medicare Other | Source: Ambulatory Visit | Attending: Pulmonary Disease | Admitting: Pulmonary Disease

## 2018-11-03 DIAGNOSIS — Z1231 Encounter for screening mammogram for malignant neoplasm of breast: Secondary | ICD-10-CM

## 2019-06-07 ENCOUNTER — Encounter (HOSPITAL_COMMUNITY): Payer: Self-pay

## 2019-06-07 ENCOUNTER — Emergency Department (HOSPITAL_COMMUNITY): Payer: Medicare Other

## 2019-06-07 ENCOUNTER — Other Ambulatory Visit: Payer: Self-pay

## 2019-06-07 ENCOUNTER — Emergency Department (HOSPITAL_COMMUNITY)
Admission: EM | Admit: 2019-06-07 | Discharge: 2019-06-07 | Disposition: A | Discharge status: Home / Self Care | Payer: Medicare Other | Attending: Emergency Medicine | Admitting: Emergency Medicine

## 2019-06-07 DIAGNOSIS — Z7984 Long term (current) use of oral hypoglycemic drugs: Secondary | ICD-10-CM | POA: Diagnosis not present

## 2019-06-07 DIAGNOSIS — E119 Type 2 diabetes mellitus without complications: Secondary | ICD-10-CM | POA: Diagnosis not present

## 2019-06-07 DIAGNOSIS — Z7982 Long term (current) use of aspirin: Secondary | ICD-10-CM | POA: Insufficient documentation

## 2019-06-07 DIAGNOSIS — I129 Hypertensive chronic kidney disease with stage 1 through stage 4 chronic kidney disease, or unspecified chronic kidney disease: Secondary | ICD-10-CM | POA: Diagnosis not present

## 2019-06-07 DIAGNOSIS — D5700 Hb-SS disease with crisis, unspecified: Secondary | ICD-10-CM | POA: Diagnosis not present

## 2019-06-07 DIAGNOSIS — E039 Hypothyroidism, unspecified: Secondary | ICD-10-CM | POA: Diagnosis not present

## 2019-06-07 DIAGNOSIS — Z79899 Other long term (current) drug therapy: Secondary | ICD-10-CM | POA: Insufficient documentation

## 2019-06-07 DIAGNOSIS — I259 Chronic ischemic heart disease, unspecified: Secondary | ICD-10-CM | POA: Insufficient documentation

## 2019-06-07 DIAGNOSIS — N183 Chronic kidney disease, stage 3 (moderate): Secondary | ICD-10-CM | POA: Insufficient documentation

## 2019-06-07 DIAGNOSIS — Z87891 Personal history of nicotine dependence: Secondary | ICD-10-CM | POA: Diagnosis not present

## 2019-06-07 LAB — COMPREHENSIVE METABOLIC PANEL
ALT: 17 U/L (ref 0–44)
AST: 23 U/L (ref 15–41)
Albumin: 4.9 g/dL (ref 3.5–5.0)
Alkaline Phosphatase: 100 U/L (ref 38–126)
Anion gap: 9 (ref 5–15)
BUN: 35 mg/dL — ABNORMAL HIGH (ref 8–23)
CO2: 20 mmol/L — ABNORMAL LOW (ref 22–32)
Calcium: 9.8 mg/dL (ref 8.9–10.3)
Chloride: 110 mmol/L (ref 98–111)
Creatinine, Ser: 1.66 mg/dL — ABNORMAL HIGH (ref 0.44–1.00)
GFR calc Af Amer: 36 mL/min — ABNORMAL LOW (ref >60)
GFR calc non Af Amer: 31 mL/min — ABNORMAL LOW (ref >60)
Glucose, Bld: 183 mg/dL — ABNORMAL HIGH (ref 70–99)
Potassium: 5 mmol/L (ref 3.5–5.1)
Sodium: 139 mmol/L (ref 135–145)
Total Bilirubin: 1.6 mg/dL — ABNORMAL HIGH (ref 0.3–1.2)
Total Protein: 9.2 g/dL — ABNORMAL HIGH (ref 6.5–8.1)

## 2019-06-07 LAB — CBC WITH DIFFERENTIAL/PLATELET
Abs Immature Granulocytes: 0.06 10*3/uL (ref 0.00–0.07)
Basophils Absolute: 0 10*3/uL (ref 0.0–0.1)
Basophils Relative: 0 %
Eosinophils Absolute: 0 10*3/uL (ref 0.0–0.5)
Eosinophils Relative: 0 %
HCT: 29.8 % — ABNORMAL LOW (ref 36.0–46.0)
Hemoglobin: 10 g/dL — ABNORMAL LOW (ref 12.0–15.0)
Immature Granulocytes: 1 %
Lymphocytes Relative: 13 %
Lymphs Abs: 1.5 10*3/uL (ref 0.7–4.0)
MCH: 28.8 pg (ref 26.0–34.0)
MCHC: 33.6 g/dL (ref 30.0–36.0)
MCV: 85.9 fL (ref 80.0–100.0)
Monocytes Absolute: 0.8 10*3/uL (ref 0.1–1.0)
Monocytes Relative: 7 %
Neutro Abs: 9.3 10*3/uL — ABNORMAL HIGH (ref 1.7–7.7)
Neutrophils Relative %: 79 %
Platelets: 228 10*3/uL (ref 150–400)
RBC: 3.47 MIL/uL — ABNORMAL LOW (ref 3.87–5.11)
RDW: 16.8 % — ABNORMAL HIGH (ref 11.5–15.5)
WBC: 11.8 10*3/uL — ABNORMAL HIGH (ref 4.0–10.5)
nRBC: 1 % — ABNORMAL HIGH (ref 0.0–0.2)

## 2019-06-07 LAB — RETICULOCYTES
Immature Retic Fract: 26.3 % — ABNORMAL HIGH (ref 2.3–15.9)
RBC.: 3.47 MIL/uL — ABNORMAL LOW (ref 3.87–5.11)
Retic Count, Absolute: 156.5 10*3/uL (ref 19.0–186.0)
Retic Ct Pct: 4.5 % — ABNORMAL HIGH (ref 0.4–3.1)

## 2019-06-07 MED ORDER — ONDANSETRON HCL 4 MG/2ML IJ SOLN
4.0000 mg | Freq: Once | INTRAMUSCULAR | Status: AC
  Administered 2019-06-07: 4 mg via INTRAVENOUS
  Filled 2019-06-07: qty 2

## 2019-06-07 MED ORDER — HYDROMORPHONE HCL 1 MG/ML IJ SOLN: 0.5000 mg | Freq: Once | INTRAMUSCULAR | Status: DC

## 2019-06-07 MED ORDER — ONDANSETRON HCL 4 MG/2ML IJ SOLN: 4.0000 mg | INTRAMUSCULAR | Status: DC | PRN

## 2019-06-07 MED ORDER — HYDROMORPHONE HCL 1 MG/ML IJ SOLN: 1.0000 mg | INTRAMUSCULAR | Status: DC

## 2019-06-07 MED ORDER — HYDROMORPHONE HCL 1 MG/ML IJ SOLN
1.0000 mg | INTRAMUSCULAR | Status: AC
  Administered 2019-06-07: 1 mg via INTRAVENOUS
  Filled 2019-06-07: qty 1

## 2019-06-07 MED ORDER — SODIUM CHLORIDE 0.9% FLUSH
3.0000 mL | Freq: Once | INTRAVENOUS | Status: AC
  Administered 2019-06-07: 3 mL via INTRAVENOUS

## 2019-06-07 MED ORDER — SODIUM CHLORIDE 0.45 % IV SOLN
INTRAVENOUS | Status: DC
  Administered 2019-06-07: 14:00:00 via INTRAVENOUS

## 2019-06-07 NOTE — Discharge Instructions (Signed)
Please follow up with your primary care provider within 5-7 days for re-evaluation of your symptoms. If you do not have a primary care provider, information for a healthcare clinic has been provided for you to make arrangements for follow up care. Please return to the emergency department for any new or worsening symptoms. ° °

## 2019-06-07 NOTE — ED Notes (Signed)
Pt has had one cup of apple juice and done well

## 2019-06-07 NOTE — ED Triage Notes (Signed)
Patient c/o sickle cell pain left leg and mid back pain. Patient denies any chest pain or SOB.

## 2019-06-07 NOTE — ED Provider Notes (Addendum)
[facility] DEPT Provider Note   CSN: [id number] Arrival date & time: 06/07/19  1206    History   Chief Complaint Chief Complaint  Patient presents with   Sickle Cell Pain Crisis    HPI [name] is a 73 y.o. female.     HPI   Pt is a 73 y/o female with a h/o CAD, DM, glaucoma, HTN, renal insufficiancy, sickle cell anemia, who presents to the ED for eval of a sickle cell crisis. She states that 2 days ago she began to have pain to her left leg and back. Pain in the left leg has improved but pain in her mid back is still present. Pain rated 10/10.   States that this is typical for her sickle cell crisis. She tried taking her home pain medication, hydrocodone, which did not improve sxs.   Yesterday she had pain to the lower part of the chest bilaterally. She states that this has since resolved. Denies any shortness of breath, fevers, cough. Denies recent falls or trauma. Pt denies any numbness/tingling/weakness to the BLE. Denies saddle anesthesia. Denies loss of control of bowels or bladder. No urinary retention. Denies a h/o IVDU. Denies a h/o CA.   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      Home Medications    Prior to  Admission medications   Medication Sig Start Date End Date Taking? Authorizing Provider  acetaminophen (TYLENOL) 500 MG tablet Take 1,000 mg by mouth every 6 (six) hours as needed for mild pain or moderate pain.    Yes [provider]  amLODipine (NORVASC) 10 MG tablet Take 10 mg by mouth daily.   Yes [provider]  aspirin EC 81 MG tablet Take 81 mg by mouth daily.   Yes [provider]  atorvastatin (LIPITOR) 40 MG tablet Take 40 mg by mouth at bedtime.    Yes [provider]  Calcium Carb-Cholecalciferol 600-800 MG-UNIT TABS Take 1 tablet by mouth daily.   Yes [provider]  cycloSPORINE (RESTASIS) 0.05 % ophthalmic emulsion Place 1 drop into both eyes 2 (two) times daily.   Yes [provider]  ferrous sulfate 325 (65 FE) MG tablet Take 325 mg by mouth daily with breakfast.   Yes [provider]  folic acid (FOLVITE) 1 MG tablet Take 1 mg by mouth daily.    Yes [provider]  glipiZIDE (GLUCOTROL XL) 5 MG 24 hr tablet Take 5 mg by mouth daily with breakfast.    Yes [provider]  HYDROcodone-acetaminophen (NORCO/VICODIN) 5-325 MG per tablet Take 1 tablet by mouth every 6 (six)  hours as needed. Patient taking differently: Take 1 tablet by mouth every 6 (six) hours as needed for moderate pain.  06/16/15  Yes Nanavati, Ankit, MD  insulin glargine (LANTUS) 100 UNIT/ML injection Inject 0.18 mLs (18 Units total) into the skin at bedtime. 09/02/17  Yes Leana Gamer, MD  levothyroxine (SYNTHROID, LEVOTHROID) 75 MCG tablet Take 75 mcg by mouth daily before breakfast.   Yes [provider]  losartan (COZAAR) 100 MG tablet Take 100 mg by mouth daily.   Yes Vincente Liberty, MD  metoprolol succinate (TOPROL-XL) 50 MG 24 hr tablet Take 50 mg by mouth daily. Take with or immediately following a meal.   Yes [provider]  timolol (TIMOPTIC) 0.5 % ophthalmic solution Place 1 drop into both eyes 2  (two) times daily.   Yes [provider]  meclizine (ANTIVERT) 25 MG tablet Take 1 tablet (25 mg total) by mouth 3 (three) times daily as needed for dizziness. Patient not taking: Reported on 05/30/2018 10/26/17   Page, Nathan Mali, MD    Family History Family History  Problem Relation Age of Onset   Diabetes Mother    Cancer Father        Prostate   Diabetes Father    Cancer Brother    Diabetes Brother    Breast cancer Maternal Aunt        does not know age    Social History Social History   Tobacco Use   Smoking status: Former Smoker   Smokeless tobacco: Never Used  Substance Use Topics   Alcohol use: No   Drug use: No     Allergies   Bee venom and Nsaids   Review of Systems Review of Systems  Constitutional: Negative for chills and fever.  HENT: Negative for ear pain and sore throat.   Eyes: Negative for pain and visual disturbance.  Respiratory: Negative for cough and shortness of breath.   Cardiovascular: Positive for chest pain (bilat lower chest). Negative for palpitations.  Gastrointestinal: Negative for abdominal pain and vomiting.  Genitourinary: Negative for dysuria and hematuria.  Musculoskeletal: Positive for back pain. Negative for arthralgias.       Leg pain  Skin: Negative for color change and rash.  Neurological: Negative for seizures and syncope.  All other systems reviewed and are negative.    Physical Exam Updated Vital Signs BP (!) 149/60    Pulse (!) 58    Temp 98.1 F (36.7 C) (Oral)    Resp 17    Ht 5\' 3"  (1.6 m)    Wt 68 kg    SpO2 97%    BMI 26.57 kg/m   Physical Exam Vitals signs and nursing note reviewed.  Constitutional:      General: She is not in acute distress.    Appearance: She is well-developed.  HENT:     Head: Normocephalic and atraumatic.  Eyes:     Conjunctiva/sclera: Conjunctivae normal.  Neck:     Musculoskeletal: Neck supple.  Cardiovascular:     Rate and Rhythm: Normal rate and regular  rhythm.     Heart sounds: Normal heart sounds. No murmur.  Pulmonary:     Effort: Pulmonary effort is normal. No respiratory distress.     Breath sounds: Normal breath sounds. No wheezing, rhonchi or rales.  Chest:     Chest wall: No tenderness.  Abdominal:     General: Bowel sounds are normal.     Palpations: Abdomen is soft.  Tenderness: There is no abdominal tenderness. There is no guarding or rebound.  Musculoskeletal:        General: No tenderness.     Comments: Mild midline ttp to the thoracic spine  Skin:    General: Skin is warm and dry.  Neurological:     Mental Status: She is alert.      ED Treatments / Results  Labs (all labs ordered are listed, but only abnormal results are displayed) Labs Reviewed  COMPREHENSIVE METABOLIC PANEL - Abnormal; Notable for the following components:      Result Value   CO2 20 (*)    Glucose, Bld 183 (*)    BUN 35 (*)    Creatinine, Ser 1.66 (*)    Total Protein 9.2 (*)    Total Bilirubin 1.6 (*)    GFR calc non Af Amer 31 (*)    GFR calc Af Amer 36 (*)    All other components within normal limits  CBC WITH DIFFERENTIAL/PLATELET - Abnormal; Notable for the following components:   WBC 11.8 (*)    RBC 3.47 (*)    Hemoglobin 10.0 (*)    HCT 29.8 (*)    RDW 16.8 (*)    nRBC 1.0 (*)    Neutro Abs 9.3 (*)    All other components within normal limits  RETICULOCYTES - Abnormal; Notable for the following components:   Retic Ct Pct 4.5 (*)    RBC. 3.47 (*)    Immature Retic Fract 26.3 (*)    All other components within normal limits    EKG EKG Interpretation  Date/Time:  Tuesday June 07 2019 13:55:04 EDT Ventricular Rate:  67 PR Interval:    QRS Duration: 119 QT Interval:  409 QTC Calculation: 432 R Axis:   83 Text Interpretation:  Sinus rhythm Nonspecific intraventricular conduction delay Anteroseptal infarct, old Nonspecific T abnormalities, lateral leads No significant change since last tracing Abnormal ECG Confirmed  by Carmin Muskrat [phone]) on 06/07/2019 3:04:12 PM   Radiology Dg Chest 2 View  Result Date: 06/07/2019 CLINICAL DATA:  Pt c/o central cp x 3 days, w/ upper back pain as well, pt hx of sickle cell anemia. EXAM: CHEST - 2 VIEW COMPARISON:  05/29/2018 FINDINGS: Cardiac silhouette is normal in size. No mediastinal or hilar masses or evidence of adenopathy. Clear lungs.  No pleural effusion or pneumothorax. No acute skeletal abnormality. IMPRESSION: No active cardiopulmonary disease. Electronically Signed   By: Lajean Manes M.D.   On: 06/07/2019 14:34   Dg Thoracic Spine 2 View  Result Date: 06/07/2019 CLINICAL DATA:  Pt c/o central cp x 3 days, w/ upper back pain as well, pt hx of sickle cell anemia. EXAM: THORACIC SPINE 2 VIEWS COMPARISON:  Chest radiograph, 08/31/2017 FINDINGS: No fracture, bone lesion or spondylolisthesis. No significant degenerative changes. Soft tissues are unremarkable. IMPRESSION: Negative. Electronically Signed   By: Lajean Manes M.D.   On: 06/07/2019 14:35    Procedures Procedures (including critical care time)  Medications Ordered in ED Medications  0.45 % sodium chloride infusion ( Intravenous Stopped 06/07/19 1700)  ondansetron (ZOFRAN) injection 4 mg (has no administration in time range)  sodium chloride flush (NS) 0.9 % injection 3 mL (3 mLs Intravenous Given 06/07/19 1243)  HYDROmorphone (DILAUDID) injection 1 mg (1 mg Intravenous Given 06/07/19 1357)  ondansetron (ZOFRAN) injection 4 mg (4 mg Intravenous Given 06/07/19 1622)     Initial Impression / Assessment and Plan / ED Course  I have reviewed the  triage vital signs and the nursing notes.  Pertinent labs & imaging results that were available during my care of the patient were reviewed by me and considered in my medical decision making (see chart for details).   Final Clinical Impressions(s) / ED Diagnoses   Final diagnoses:  Sickle cell pain crisis ([place])   73 year old female with a history of sickle cell  anemia presenting for sickle cell pain crisis of the left lower extremity and mid back for the last 2 days.  She is tried her home pain medications without relief.  Yesterday had some bilateral lower chest wall pain which has since resolved.  She does not have any substernal chest pain or shortness of breath.  No cough or fevers.  CBC shows a mild leukocytosis at 11.8, hemoglobin is stable from a year ago at 10. CMP with low bicarb at 20.  She does have elevated BUN/creatinine which appears consistent with prior.  Bilirubin is slightly elevated but not significantly. Immature reticulocyte count is elevated as to be expected.  She does have some mild midline tenderness therefore x-ray of the thoracic spine obtained without any evidence of acute fracture.  Chest x-ray obtained and does not show any acute abnormalities.  EKG is unchanged from prior.  Patient was given IV fluids and pain medications.  On reassessment she states she feels much better.  She did become somewhat nauseous and have an episode of vomiting on my reevaluation therefore she is given an extra dose of Zofran.  Following this, she was able to tolerate p.o. fluids and felt much better.  Given her reassuring work-up, improvement with medications, I do feel that she is appropriate to be discharged.  I have discussed the plan with her and she is agreeable to this.  I advised her to follow-up closely with her PCP and return to the ER for new or worsening symptoms.  She voices understanding of the plan and reasons to return.  All questions answered.  Patient stable for discharge.  Patient was seen in conjunction with Dr. Vanita Panda who personally evaluated the patient is in agreement this plan.  ED Discharge Orders    None       Bishop Dublin 06/07/19 1745    Carmin Muskrat, MD 06/08/19 0856    Rodney Booze, PA-C 06/28/19 2129    Carmin Muskrat, MD 06/30/19 1246

## 2019-06-10 ENCOUNTER — Other Ambulatory Visit: Payer: Self-pay

## 2019-06-10 ENCOUNTER — Emergency Department (HOSPITAL_COMMUNITY)
Admission: EM | Admit: 2019-06-10 | Discharge: 2019-06-10 | Disposition: A | Discharge status: Home / Self Care | Payer: Medicare Other | Source: Home / Self Care | Attending: Emergency Medicine | Admitting: Emergency Medicine

## 2019-06-10 ENCOUNTER — Encounter (HOSPITAL_COMMUNITY): Payer: Self-pay

## 2019-06-10 DIAGNOSIS — D57219 Sickle-cell/Hb-C disease with crisis, unspecified: Secondary | ICD-10-CM | POA: Diagnosis not present

## 2019-06-10 DIAGNOSIS — Z79899 Other long term (current) drug therapy: Secondary | ICD-10-CM | POA: Insufficient documentation

## 2019-06-10 DIAGNOSIS — I10 Essential (primary) hypertension: Secondary | ICD-10-CM | POA: Insufficient documentation

## 2019-06-10 DIAGNOSIS — D5700 Hb-SS disease with crisis, unspecified: Secondary | ICD-10-CM | POA: Insufficient documentation

## 2019-06-10 DIAGNOSIS — E119 Type 2 diabetes mellitus without complications: Secondary | ICD-10-CM | POA: Insufficient documentation

## 2019-06-10 DIAGNOSIS — E039 Hypothyroidism, unspecified: Secondary | ICD-10-CM | POA: Insufficient documentation

## 2019-06-10 DIAGNOSIS — Z87891 Personal history of nicotine dependence: Secondary | ICD-10-CM | POA: Insufficient documentation

## 2019-06-10 LAB — CBC WITH DIFFERENTIAL/PLATELET
Abs Immature Granulocytes: 0.1 10*3/uL — ABNORMAL HIGH (ref 0.00–0.07)
Basophils Absolute: 0.1 10*3/uL (ref 0.0–0.1)
Basophils Relative: 0 %
Eosinophils Absolute: 0.2 10*3/uL (ref 0.0–0.5)
Eosinophils Relative: 1 %
HCT: 27.8 % — ABNORMAL LOW (ref 36.0–46.0)
Hemoglobin: 9.5 g/dL — ABNORMAL LOW (ref 12.0–15.0)
Immature Granulocytes: 1 %
Lymphocytes Relative: 11 %
Lymphs Abs: 1.8 10*3/uL (ref 0.7–4.0)
MCH: 29.5 pg (ref 26.0–34.0)
MCHC: 34.2 g/dL (ref 30.0–36.0)
MCV: 86.3 fL (ref 80.0–100.0)
Monocytes Absolute: 1.5 10*3/uL — ABNORMAL HIGH (ref 0.1–1.0)
Monocytes Relative: 10 %
Neutro Abs: 12.3 10*3/uL — ABNORMAL HIGH (ref 1.7–7.7)
Neutrophils Relative %: 77 %
Platelets: 198 10*3/uL (ref 150–400)
RBC: 3.22 MIL/uL — ABNORMAL LOW (ref 3.87–5.11)
RDW: 16.9 % — ABNORMAL HIGH (ref 11.5–15.5)
WBC: 15.9 10*3/uL — ABNORMAL HIGH (ref 4.0–10.5)
nRBC: 0.8 % — ABNORMAL HIGH (ref 0.0–0.2)

## 2019-06-10 LAB — COMPREHENSIVE METABOLIC PANEL
ALT: 19 U/L (ref 0–44)
AST: 25 U/L (ref 15–41)
Albumin: 4.5 g/dL (ref 3.5–5.0)
Alkaline Phosphatase: 91 U/L (ref 38–126)
Anion gap: 8 (ref 5–15)
BUN: 39 mg/dL — ABNORMAL HIGH (ref 8–23)
CO2: 22 mmol/L (ref 22–32)
Calcium: 9.3 mg/dL (ref 8.9–10.3)
Chloride: 108 mmol/L (ref 98–111)
Creatinine, Ser: 1.71 mg/dL — ABNORMAL HIGH (ref 0.44–1.00)
GFR calc Af Amer: 35 mL/min — ABNORMAL LOW (ref >60)
GFR calc non Af Amer: 30 mL/min — ABNORMAL LOW (ref >60)
Glucose, Bld: 128 mg/dL — ABNORMAL HIGH (ref 70–99)
Potassium: 4.7 mmol/L (ref 3.5–5.1)
Sodium: 138 mmol/L (ref 135–145)
Total Bilirubin: 1.3 mg/dL — ABNORMAL HIGH (ref 0.3–1.2)
Total Protein: 8.6 g/dL — ABNORMAL HIGH (ref 6.5–8.1)

## 2019-06-10 LAB — RETICULOCYTES
Immature Retic Fract: 29.3 % — ABNORMAL HIGH (ref 2.3–15.9)
RBC.: 3.22 MIL/uL — ABNORMAL LOW (ref 3.87–5.11)
Retic Count, Absolute: 153.6 10*3/uL (ref 19.0–186.0)
Retic Ct Pct: 4.8 % — ABNORMAL HIGH (ref 0.4–3.1)

## 2019-06-10 MED ORDER — SODIUM CHLORIDE 0.9% FLUSH
3.0000 mL | Freq: Once | INTRAVENOUS | Status: AC
  Administered 2019-06-10: 3 mL via INTRAVENOUS

## 2019-06-10 MED ORDER — HYDROMORPHONE HCL 1 MG/ML IJ SOLN: 0.5000 mg | INTRAMUSCULAR | Status: AC

## 2019-06-10 MED ORDER — HYDROMORPHONE HCL 1 MG/ML IJ SOLN: 1.0000 mg | INTRAMUSCULAR | Status: AC

## 2019-06-10 MED ORDER — HYDROMORPHONE HCL 1 MG/ML IJ SOLN: 1.0000 mg | INTRAMUSCULAR | Status: DC

## 2019-06-10 MED ORDER — ONDANSETRON HCL 4 MG/2ML IJ SOLN
4.0000 mg | Freq: Once | INTRAMUSCULAR | Status: AC
  Administered 2019-06-10: 4 mg via INTRAVENOUS
  Filled 2019-06-10: qty 2

## 2019-06-10 MED ORDER — HYDROMORPHONE HCL 1 MG/ML IJ SOLN
0.5000 mg | INTRAMUSCULAR | Status: AC
  Administered 2019-06-10: 08:00:00 0.5 mg via INTRAVENOUS
  Filled 2019-06-10: qty 1

## 2019-06-10 MED ORDER — HYDROMORPHONE HCL 1 MG/ML IJ SOLN
1.0000 mg | INTRAMUSCULAR | Status: AC
  Administered 2019-06-10: 1 mg via INTRAVENOUS
  Filled 2019-06-10: qty 1

## 2019-06-10 NOTE — ED Triage Notes (Signed)
Pt states that she was recently seen for a sickle cell pain crisis and felt better but her pain has returned. Pt states she has not taken her home medication today. Denies any current chest pain or shortness of breath.

## 2019-06-10 NOTE — ED Notes (Signed)
Oxygen applied at this time as patient's room air saturation read 87-90%.

## 2019-06-10 NOTE — ED Provider Notes (Signed)
[facility] DEPT Provider Note   CSN: [id number] Arrival date & time: 06/10/19  0554     History   Chief Complaint Chief Complaint  Patient presents with  . Sickle Cell Pain Crisis    HPI [name] is a 73 y.o. female.     73 year old female presents with lower back pain consistent with prior sickle cell crisis.  Denies any fever or chills.  No cough or shortness of breath.  Denies any urinary symptoms.  No rashes to her flank area.  Seen here 3 days ago for similar symptoms and was treated in the ER and felt better.  States she took her home Vicodin without relief.  Denies any new injuries.  No radiation to her legs.     Past Medical History:  Diagnosis Date  . Abdominal pain   . Coronary artery disease   . Diabetes mellitus without complication ([place])   . Glaucoma   . Hypertension   . Hypothyroid   . Renal insufficiency 09/01/2017  . Sickle cell anemia [facility])     Patient Active Problem List   Diagnosis Date Noted  . 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 DM2 (diabetes mellitus, type 2) ([place]) 09/01/2017  . HTN (hypertension) 09/01/2017  . CKD (chronic kidney disease), stage III ([place]) 09/01/2017  . Acute lower UTI 09/01/2017    Past Surgical History:  Procedure Laterality Date  . CHOLECYSTECTOMY    . HIP SURGERY     6 yrs ago  . REFRACTIVE SURGERY    . RETINAL DETACHMENT SURGERY       OB History   No obstetric history on file.      Home Medications    Prior to Admission medications   Medication Sig Start Date End Date Taking? Authorizing Provider  acetaminophen (TYLENOL) 500 MG tablet Take 1,000 mg by mouth every 6 (six) hours as needed for mild pain or moderate pain.     [provider]  amLODipine (NORVASC) 10 MG tablet Take 10 mg by mouth daily.    [provider]  aspirin EC 81 MG tablet Take 81 mg by mouth daily.    [provider]  atorvastatin  (LIPITOR) 40 MG tablet Take 40 mg by mouth at bedtime.     [provider]  Calcium Carb-Cholecalciferol 600-800 MG-UNIT TABS Take 1 tablet by mouth daily.    [provider]  cycloSPORINE (RESTASIS) 0.05 % ophthalmic emulsion Place 1 drop into both eyes 2 (two) times daily.    [provider]  ferrous sulfate 325 (65 FE) MG tablet Take 325 mg by mouth daily with breakfast.    [provider]  folic acid (FOLVITE) 1 MG tablet Take 1 mg by mouth daily.     [provider]  glipiZIDE (GLUCOTROL XL) 5 MG 24 hr tablet Take 5 mg by mouth daily with breakfast.     [provider]  HYDROcodone-acetaminophen (NORCO/VICODIN) 5-325 MG per tablet Take 1 tablet by mouth every 6 (six) hours as needed. Patient taking differently: Take 1 tablet by mouth every 6 (six) hours as needed for moderate pain.  06/16/15   Varney Biles, MD  insulin glargine (LANTUS) 100 UNIT/ML injection Inject 0.18 mLs (18 Units total) into the skin at bedtime. 09/02/17   Leana Gamer, MD  levothyroxine (SYNTHROID, LEVOTHROID) 75 MCG tablet Take 75 mcg by mouth daily before breakfast.    [provider]  losartan (COZAAR) 100  MG tablet Take 100 mg by mouth daily.    Vincente Liberty, MD  meclizine (ANTIVERT) 25 MG tablet Take 1 tablet (25 mg total) by mouth 3 (three) times daily as needed for dizziness. Patient not taking: Reported on 05/30/2018 10/26/17   Page, Nathan Mali, MD  metoprolol succinate (TOPROL-XL) 50 MG 24 hr tablet Take 50 mg by mouth daily. Take with or immediately following a meal.    [provider]  timolol (TIMOPTIC) 0.5 % ophthalmic solution Place 1 drop into both eyes 2 (two) times daily.    [provider]    Family History Family History  Problem Relation Age of Onset  . Diabetes Mother   . Cancer Father        Prostate  . Diabetes Father   . Cancer Brother   . Diabetes Brother   . Breast cancer Maternal Aunt         does not know age    Social History Social History   Tobacco Use  . Smoking status: Former Research scientist (life sciences)  . Smokeless tobacco: Never Used  Substance Use Topics  . Alcohol use: No  . Drug use: No     Allergies   Bee venom and Nsaids   Review of Systems Review of Systems  All other systems reviewed and are negative.    Physical Exam Updated Vital Signs BP (!) 167/68 (BP Location: Left Arm)   Pulse 69   Temp 98.9 F (37.2 C) (Oral)   Resp 17   SpO2 98%   Physical Exam Vitals signs and nursing note reviewed.  Constitutional:      General: She is not in acute distress.    Appearance: Normal appearance. She is well-developed. She is not toxic-appearing.  HENT:     Head: Normocephalic and atraumatic.  Eyes:     General: Lids are normal.     Conjunctiva/sclera: Conjunctivae normal.     Pupils: Pupils are equal, round, and reactive to light.  Neck:     Musculoskeletal: Normal range of motion and neck supple.     Thyroid: No thyroid mass.     Trachea: No tracheal deviation.  Cardiovascular:     Rate and Rhythm: Normal rate and regular rhythm.     Heart sounds: Normal heart sounds. No murmur. No gallop.   Pulmonary:     Effort: Pulmonary effort is normal. No respiratory distress.     Breath sounds: Normal breath sounds. No stridor. No decreased breath sounds, wheezing, rhonchi or rales.  Abdominal:     General: Bowel sounds are normal. There is no distension.     Palpations: Abdomen is soft.     Tenderness: There is no abdominal tenderness. There is no rebound.  Musculoskeletal: Normal range of motion.        General: No tenderness.  Skin:    General: Skin is warm and dry.     Findings: No abrasion or rash.  Neurological:     Mental Status: She is alert and oriented to person, place, and time.     GCS: GCS eye subscore is 4. GCS verbal subscore is 5. GCS motor subscore is 6.     Cranial Nerves: No cranial nerve deficit.     Sensory: No sensory deficit.  Psychiatric:         Speech: Speech normal.        Behavior: Behavior normal.      ED Treatments / Results  Labs (all labs ordered are listed, but only  abnormal results are displayed) Labs Reviewed  URINE CULTURE  COMPREHENSIVE METABOLIC PANEL  CBC WITH DIFFERENTIAL/PLATELET  RETICULOCYTES  URINALYSIS, ROUTINE W REFLEX MICROSCOPIC    EKG None  Radiology No results found.  Procedures Procedures (including critical care time)  Medications Ordered in ED Medications  sodium chloride flush (NS) 0.9 % injection 3 mL (has no administration in time range)  HYDROmorphone (DILAUDID) injection 0.5 mg (has no administration in time range)    Or  HYDROmorphone (DILAUDID) injection 0.5 mg (has no administration in time range)  HYDROmorphone (DILAUDID) injection 1 mg (has no administration in time range)    Or  HYDROmorphone (DILAUDID) injection 1 mg (has no administration in time range)  HYDROmorphone (DILAUDID) injection 1 mg (has no administration in time range)    Or  HYDROmorphone (DILAUDID) injection 1 mg (has no administration in time range)  HYDROmorphone (DILAUDID) injection 1 mg (has no administration in time range)    Or  HYDROmorphone (DILAUDID) injection 1 mg (has no administration in time range)     Initial Impression / Assessment and Plan / ED Course  I have reviewed the triage vital signs and the nursing notes.  Pertinent labs & imaging results that were available during my care of the patient were reviewed by me and considered in my medical decision making (see chart for details).        Patient treated with medications here and feels better.  Feels stable to go home and she was instructed to follow-up with her physician for pain management  Final Clinical Impressions(s) / ED Diagnoses   Final diagnoses:  None    ED Discharge Orders    None       Lacretia Leigh, MD 06/10/19 1017

## 2019-06-10 NOTE — ED Notes (Signed)
ED Provider at bedside. 

## 2019-06-10 NOTE — Discharge Instructions (Addendum)
Call Dr. Katherine Roan to schedule a follow-up visit

## 2019-06-11 ENCOUNTER — Other Ambulatory Visit: Payer: Self-pay

## 2019-06-11 ENCOUNTER — Encounter (HOSPITAL_COMMUNITY): Payer: Self-pay | Admitting: *Deleted

## 2019-06-11 ENCOUNTER — Inpatient Hospital Stay (HOSPITAL_COMMUNITY)
Admission: EM | Admit: 2019-06-11 | Discharge: 2019-06-16 | DRG: 812 | Disposition: A | Discharge status: Home / Self Care | Payer: Medicare Other | Attending: Internal Medicine | Admitting: Internal Medicine

## 2019-06-11 DIAGNOSIS — Z79899 Other long term (current) drug therapy: Secondary | ICD-10-CM

## 2019-06-11 DIAGNOSIS — D57219 Sickle-cell/Hb-C disease with crisis, unspecified: Secondary | ICD-10-CM | POA: Diagnosis present

## 2019-06-11 DIAGNOSIS — E039 Hypothyroidism, unspecified: Secondary | ICD-10-CM | POA: Diagnosis present

## 2019-06-11 DIAGNOSIS — Z7982 Long term (current) use of aspirin: Secondary | ICD-10-CM

## 2019-06-11 DIAGNOSIS — I129 Hypertensive chronic kidney disease with stage 1 through stage 4 chronic kidney disease, or unspecified chronic kidney disease: Secondary | ICD-10-CM | POA: Diagnosis present

## 2019-06-11 DIAGNOSIS — I2510 Atherosclerotic heart disease of native coronary artery without angina pectoris: Secondary | ICD-10-CM | POA: Diagnosis present

## 2019-06-11 DIAGNOSIS — Z87891 Personal history of nicotine dependence: Secondary | ICD-10-CM | POA: Diagnosis not present

## 2019-06-11 DIAGNOSIS — D5700 Hb-SS disease with crisis, unspecified: Secondary | ICD-10-CM

## 2019-06-11 DIAGNOSIS — G894 Chronic pain syndrome: Secondary | ICD-10-CM | POA: Diagnosis present

## 2019-06-11 DIAGNOSIS — Z9049 Acquired absence of other specified parts of digestive tract: Secondary | ICD-10-CM

## 2019-06-11 DIAGNOSIS — I10 Essential (primary) hypertension: Secondary | ICD-10-CM | POA: Diagnosis not present

## 2019-06-11 DIAGNOSIS — H409 Unspecified glaucoma: Secondary | ICD-10-CM | POA: Diagnosis present

## 2019-06-11 DIAGNOSIS — E1122 Type 2 diabetes mellitus with diabetic chronic kidney disease: Secondary | ICD-10-CM | POA: Diagnosis present

## 2019-06-11 DIAGNOSIS — N184 Chronic kidney disease, stage 4 (severe): Secondary | ICD-10-CM | POA: Diagnosis present

## 2019-06-11 DIAGNOSIS — Z794 Long term (current) use of insulin: Secondary | ICD-10-CM

## 2019-06-11 DIAGNOSIS — D638 Anemia in other chronic diseases classified elsewhere: Secondary | ICD-10-CM | POA: Diagnosis not present

## 2019-06-11 DIAGNOSIS — Z1159 Encounter for screening for other viral diseases: Secondary | ICD-10-CM

## 2019-06-11 DIAGNOSIS — Z886 Allergy status to analgesic agent status: Secondary | ICD-10-CM

## 2019-06-11 DIAGNOSIS — Z833 Family history of diabetes mellitus: Secondary | ICD-10-CM | POA: Diagnosis not present

## 2019-06-11 DIAGNOSIS — N179 Acute kidney failure, unspecified: Secondary | ICD-10-CM

## 2019-06-11 DIAGNOSIS — E1121 Type 2 diabetes mellitus with diabetic nephropathy: Secondary | ICD-10-CM | POA: Diagnosis not present

## 2019-06-11 DIAGNOSIS — Z79890 Hormone replacement therapy (postmenopausal): Secondary | ICD-10-CM | POA: Diagnosis not present

## 2019-06-11 DIAGNOSIS — D72829 Elevated white blood cell count, unspecified: Secondary | ICD-10-CM | POA: Diagnosis present

## 2019-06-11 DIAGNOSIS — Z91030 Bee allergy status: Secondary | ICD-10-CM | POA: Diagnosis not present

## 2019-06-11 DIAGNOSIS — E11649 Type 2 diabetes mellitus with hypoglycemia without coma: Secondary | ICD-10-CM | POA: Diagnosis not present

## 2019-06-11 LAB — RETICULOCYTES
Immature Retic Fract: 31.2 % — ABNORMAL HIGH (ref 2.3–15.9)
RBC.: 3.24 MIL/uL — ABNORMAL LOW (ref 3.87–5.11)
Retic Count, Absolute: 151.6 10*3/uL (ref 19.0–186.0)
Retic Ct Pct: 4.7 % — ABNORMAL HIGH (ref 0.4–3.1)

## 2019-06-11 LAB — COMPREHENSIVE METABOLIC PANEL
ALT: 22 U/L (ref 0–44)
AST: 23 U/L (ref 15–41)
Albumin: 4.5 g/dL (ref 3.5–5.0)
Alkaline Phosphatase: 95 U/L (ref 38–126)
Anion gap: 9 (ref 5–15)
BUN: 39 mg/dL — ABNORMAL HIGH (ref 8–23)
CO2: 22 mmol/L (ref 22–32)
Calcium: 9.5 mg/dL (ref 8.9–10.3)
Chloride: 110 mmol/L (ref 98–111)
Creatinine, Ser: 1.76 mg/dL — ABNORMAL HIGH (ref 0.44–1.00)
GFR calc Af Amer: 33 mL/min — ABNORMAL LOW (ref >60)
GFR calc non Af Amer: 29 mL/min — ABNORMAL LOW (ref >60)
Glucose, Bld: 180 mg/dL — ABNORMAL HIGH (ref 70–99)
Potassium: 4.5 mmol/L (ref 3.5–5.1)
Sodium: 141 mmol/L (ref 135–145)
Total Bilirubin: 1.4 mg/dL — ABNORMAL HIGH (ref 0.3–1.2)
Total Protein: 8.3 g/dL — ABNORMAL HIGH (ref 6.5–8.1)

## 2019-06-11 LAB — CBC WITH DIFFERENTIAL/PLATELET
Abs Immature Granulocytes: 0.07 10*3/uL (ref 0.00–0.07)
Basophils Absolute: 0 10*3/uL (ref 0.0–0.1)
Basophils Relative: 0 %
Eosinophils Absolute: 0.1 10*3/uL (ref 0.0–0.5)
Eosinophils Relative: 1 %
HCT: 28.1 % — ABNORMAL LOW (ref 36.0–46.0)
Hemoglobin: 9.6 g/dL — ABNORMAL LOW (ref 12.0–15.0)
Immature Granulocytes: 1 %
Lymphocytes Relative: 14 %
Lymphs Abs: 1.9 10*3/uL (ref 0.7–4.0)
MCH: 29.6 pg (ref 26.0–34.0)
MCHC: 34.2 g/dL (ref 30.0–36.0)
MCV: 86.7 fL (ref 80.0–100.0)
Monocytes Absolute: 1.4 10*3/uL — ABNORMAL HIGH (ref 0.1–1.0)
Monocytes Relative: 11 %
Neutro Abs: 10 10*3/uL — ABNORMAL HIGH (ref 1.7–7.7)
Neutrophils Relative %: 73 %
Platelets: 197 10*3/uL (ref 150–400)
RBC: 3.24 MIL/uL — ABNORMAL LOW (ref 3.87–5.11)
RDW: 17.2 % — ABNORMAL HIGH (ref 11.5–15.5)
WBC: 13.6 10*3/uL — ABNORMAL HIGH (ref 4.0–10.5)
nRBC: 1.1 % — ABNORMAL HIGH (ref 0.0–0.2)

## 2019-06-11 LAB — GLUCOSE, CAPILLARY
Glucose-Capillary: 300 mg/dL — ABNORMAL HIGH (ref 70–99)
Glucose-Capillary: 252 mg/dL — ABNORMAL HIGH (ref 70–99)

## 2019-06-11 LAB — SARS CORONAVIRUS 2 BY RT PCR (HOSPITAL ORDER, PERFORMED IN ~~LOC~~ HOSPITAL LAB): SARS Coronavirus 2: NEGATIVE

## 2019-06-11 MED ORDER — LEVOTHYROXINE SODIUM 75 MCG PO TABS
75.0000 ug | ORAL_TABLET | Freq: Every day | ORAL | Status: DC
  Administered 2019-06-12 – 2019-06-16 (×5): 75 ug via ORAL
  Filled 2019-06-11 (×5): qty 1

## 2019-06-11 MED ORDER — SODIUM CHLORIDE 0.9 % IV SOLN
25.0000 mg | INTRAVENOUS | Status: DC | PRN
  Filled 2019-06-11: qty 0.5

## 2019-06-11 MED ORDER — ONDANSETRON HCL 4 MG/2ML IJ SOLN: 4.0000 mg | Freq: Four times a day (QID) | INTRAMUSCULAR | Status: DC | PRN

## 2019-06-11 MED ORDER — CALCIUM CARB-CHOLECALCIFEROL 600-800 MG-UNIT PO TABS: 1.0000 | ORAL_TABLET | Freq: Every day | ORAL | Status: DC

## 2019-06-11 MED ORDER — SODIUM CHLORIDE 0.9% FLUSH: 9.0000 mL | INTRAVENOUS | Status: DC | PRN

## 2019-06-11 MED ORDER — FOLIC ACID 1 MG PO TABS
1.0000 mg | ORAL_TABLET | Freq: Every day | ORAL | Status: DC
  Administered 2019-06-12 – 2019-06-16 (×5): 1 mg via ORAL
  Filled 2019-06-11 (×7): qty 1

## 2019-06-11 MED ORDER — HYDROMORPHONE HCL 1 MG/ML IJ SOLN: 1.0000 mg | INTRAMUSCULAR | Status: AC

## 2019-06-11 MED ORDER — FERROUS SULFATE 325 (65 FE) MG PO TABS
325.0000 mg | ORAL_TABLET | Freq: Every day | ORAL | Status: DC
  Administered 2019-06-12 – 2019-06-16 (×5): 325 mg via ORAL
  Filled 2019-06-11 (×5): qty 1

## 2019-06-11 MED ORDER — HYDROCODONE-ACETAMINOPHEN 5-325 MG PO TABS: 1.0000 | ORAL_TABLET | Freq: Four times a day (QID) | ORAL | Status: DC | PRN

## 2019-06-11 MED ORDER — ENOXAPARIN SODIUM 30 MG/0.3ML ~~LOC~~ SOLN
30.0000 mg | SUBCUTANEOUS | Status: DC
  Administered 2019-06-11 – 2019-06-14 (×4): 30 mg via SUBCUTANEOUS
  Filled 2019-06-11 (×4): qty 0.3

## 2019-06-11 MED ORDER — CALCIUM CARBONATE-VITAMIN D 500-200 MG-UNIT PO TABS
1.0000 | ORAL_TABLET | Freq: Every day | ORAL | Status: DC
  Administered 2019-06-12 – 2019-06-16 (×5): 1 via ORAL
  Filled 2019-06-11 (×5): qty 1

## 2019-06-11 MED ORDER — HYDROMORPHONE 1 MG/ML IV SOLN
INTRAVENOUS | Status: DC
  Administered 2019-06-11: 30 mg via INTRAVENOUS
  Administered 2019-06-12: 1.5 mg via INTRAVENOUS
  Administered 2019-06-12 (×2): 1 mg via INTRAVENOUS
  Administered 2019-06-12 (×3): 0 mg via INTRAVENOUS
  Administered 2019-06-12: 1.5 mg via INTRAVENOUS
  Administered 2019-06-13: 1 mg via INTRAVENOUS
  Administered 2019-06-13: 1.5 mg via INTRAVENOUS
  Administered 2019-06-13: 0 mg via INTRAVENOUS
  Administered 2019-06-13 – 2019-06-14 (×2): 2 mg via INTRAVENOUS
  Administered 2019-06-14 (×2): 0 mg via INTRAVENOUS
  Filled 2019-06-11: qty 30

## 2019-06-11 MED ORDER — HYDROMORPHONE HCL 1 MG/ML IJ SOLN
1.0000 mg | INTRAMUSCULAR | Status: AC
  Administered 2019-06-11: 1 mg via INTRAVENOUS
  Filled 2019-06-11: qty 1

## 2019-06-11 MED ORDER — HYDROMORPHONE HCL 1 MG/ML IJ SOLN
0.5000 mg | INTRAMUSCULAR | Status: AC
  Administered 2019-06-11: 0.5 mg via INTRAVENOUS
  Filled 2019-06-11: qty 1

## 2019-06-11 MED ORDER — INSULIN GLARGINE 100 UNIT/ML ~~LOC~~ SOLN
18.0000 [IU] | Freq: Every day | SUBCUTANEOUS | Status: DC
  Administered 2019-06-11 – 2019-06-15 (×5): 18 [IU] via SUBCUTANEOUS
  Filled 2019-06-11 (×6): qty 0.18

## 2019-06-11 MED ORDER — INSULIN ASPART 100 UNIT/ML ~~LOC~~ SOLN
0.0000 [IU] | Freq: Three times a day (TID) | SUBCUTANEOUS | Status: DC
  Administered 2019-06-11: 5 [IU] via SUBCUTANEOUS
  Administered 2019-06-12: 1 [IU] via SUBCUTANEOUS
  Administered 2019-06-12: 2 [IU] via SUBCUTANEOUS
  Administered 2019-06-13: 1 [IU] via SUBCUTANEOUS
  Administered 2019-06-13 – 2019-06-14 (×3): 3 [IU] via SUBCUTANEOUS
  Administered 2019-06-15: 2 [IU] via SUBCUTANEOUS
  Filled 2019-06-11: qty 0.09

## 2019-06-11 MED ORDER — TIMOLOL MALEATE 0.5 % OP SOLN
1.0000 [drp] | Freq: Two times a day (BID) | OPHTHALMIC | Status: DC
  Administered 2019-06-11 – 2019-06-16 (×10): 1 [drp] via OPHTHALMIC
  Filled 2019-06-11: qty 5

## 2019-06-11 MED ORDER — DIPHENHYDRAMINE HCL 25 MG PO CAPS: 25.0000 mg | ORAL_CAPSULE | ORAL | Status: DC | PRN

## 2019-06-11 MED ORDER — METOPROLOL SUCCINATE ER 50 MG PO TB24
50.0000 mg | ORAL_TABLET | Freq: Every day | ORAL | Status: DC
  Administered 2019-06-12 – 2019-06-16 (×4): 50 mg via ORAL
  Filled 2019-06-11 (×6): qty 1

## 2019-06-11 MED ORDER — HYDROMORPHONE HCL 1 MG/ML IJ SOLN: 0.5000 mg | INTRAMUSCULAR | Status: AC

## 2019-06-11 MED ORDER — SODIUM CHLORIDE 0.9% FLUSH
3.0000 mL | Freq: Once | INTRAVENOUS | Status: AC
  Administered 2019-06-11: 14:00:00 3 mL via INTRAVENOUS

## 2019-06-11 MED ORDER — ATORVASTATIN CALCIUM 40 MG PO TABS
40.0000 mg | ORAL_TABLET | Freq: Every day | ORAL | Status: DC
  Administered 2019-06-11 – 2019-06-15 (×5): 40 mg via ORAL
  Filled 2019-06-11 (×5): qty 1

## 2019-06-11 MED ORDER — LOSARTAN POTASSIUM 50 MG PO TABS
100.0000 mg | ORAL_TABLET | Freq: Every day | ORAL | Status: DC
  Administered 2019-06-11: 100 mg via ORAL

## 2019-06-11 MED ORDER — CYCLOSPORINE 0.05 % OP EMUL
1.0000 [drp] | Freq: Two times a day (BID) | OPHTHALMIC | Status: DC
  Administered 2019-06-11 – 2019-06-16 (×10): 1 [drp] via OPHTHALMIC
  Filled 2019-06-11 (×10): qty 30

## 2019-06-11 MED ORDER — ONDANSETRON HCL 4 MG/2ML IJ SOLN
4.0000 mg | Freq: Once | INTRAMUSCULAR | Status: AC
  Administered 2019-06-11: 4 mg via INTRAVENOUS
  Filled 2019-06-11: qty 2

## 2019-06-11 MED ORDER — SENNOSIDES-DOCUSATE SODIUM 8.6-50 MG PO TABS
1.0000 | ORAL_TABLET | Freq: Two times a day (BID) | ORAL | Status: DC
  Administered 2019-06-11 – 2019-06-16 (×10): 1 via ORAL
  Filled 2019-06-11 (×9): qty 1

## 2019-06-11 MED ORDER — POLYETHYLENE GLYCOL 3350 17 G PO PACK
17.0000 g | PACK | Freq: Every day | ORAL | Status: DC | PRN
  Administered 2019-06-12: 17 g via ORAL
  Filled 2019-06-11: qty 1

## 2019-06-11 MED ORDER — HYDROMORPHONE HCL 1 MG/ML IJ SOLN: 1.0000 mg | INTRAMUSCULAR | Status: DC

## 2019-06-11 MED ORDER — NALOXONE HCL 0.4 MG/ML IJ SOLN: 0.4000 mg | INTRAMUSCULAR | Status: DC | PRN

## 2019-06-11 MED ORDER — LOSARTAN POTASSIUM 50 MG PO TABS
100.0000 mg | ORAL_TABLET | Freq: Every day | ORAL | Status: DC
  Administered 2019-06-12 – 2019-06-16 (×5): 100 mg via ORAL
  Filled 2019-06-11 (×6): qty 2

## 2019-06-11 MED ORDER — SODIUM CHLORIDE 0.45 % IV SOLN
INTRAVENOUS | Status: DC
  Administered 2019-06-11 – 2019-06-16 (×8): via INTRAVENOUS

## 2019-06-11 MED ORDER — AMLODIPINE BESYLATE 5 MG PO TABS
10.0000 mg | ORAL_TABLET | Freq: Every day | ORAL | Status: DC
  Administered 2019-06-12 – 2019-06-16 (×4): 10 mg via ORAL
  Filled 2019-06-11 (×6): qty 2

## 2019-06-11 MED ORDER — GLIPIZIDE ER 5 MG PO TB24
5.0000 mg | ORAL_TABLET | Freq: Every day | ORAL | Status: DC
  Administered 2019-06-12 – 2019-06-16 (×5): 5 mg via ORAL
  Filled 2019-06-11 (×5): qty 1

## 2019-06-11 MED ORDER — ASPIRIN EC 81 MG PO TBEC
81.0000 mg | DELAYED_RELEASE_TABLET | Freq: Every day | ORAL | Status: DC
  Administered 2019-06-12 – 2019-06-16 (×5): 81 mg via ORAL
  Filled 2019-06-11 (×6): qty 1

## 2019-06-11 MED ORDER — HYDROMORPHONE HCL 1 MG/ML IJ SOLN
1.0000 mg | INTRAMUSCULAR | Status: DC
  Administered 2019-06-11: 1 mg via INTRAVENOUS
  Filled 2019-06-11: qty 1

## 2019-06-11 MED ORDER — DEXTROSE-NACL 5-0.45 % IV SOLN
INTRAVENOUS | Status: DC
  Administered 2019-06-11: 14:00:00 via INTRAVENOUS

## 2019-06-11 NOTE — ED Triage Notes (Signed)
Sickle Cell pain in back that she can not get relieve from, 3 rd visit this week due to pain

## 2019-06-11 NOTE — ED Provider Notes (Signed)
[facility] DEPT Provider Note   CSN: [id number] Arrival date & time: 06/11/19  1105     History   Chief Complaint Chief Complaint  Patient presents with  . Sickle Cell Pain Crisis    HPI [name] is a 73 y.o. female.     73 year old female with history of sickle cell disease presents with pain to her back consistent with her prior sickle cell crisis.  I saw the patient for similar symptoms yesterday.  She was medicated and felt well to go home.  This is her third visit this week for similar symptoms.  Denies any headache, no chest pain or shortness of breath.  No urinary symptoms.  Nothing makes her pain better or worse.     Past Medical History:  Diagnosis Date  . Abdominal pain   . Coronary artery disease   . Diabetes mellitus without complication ([place])   . Glaucoma   . Hypertension   . Hypothyroid   . Renal insufficiency 09/01/2017  . Sickle cell anemia [facility])     Patient Active Problem List   Diagnosis Date Noted  . Hemoglobin s-c disease, with acute chest syndrome ([place]) 09/01/2017  . DM2 (diabetes mellitus, type 2) ([place]) 09/01/2017  . HTN (hypertension) 09/01/2017  . CKD (chronic kidney disease), stage III ([place]) 09/01/2017  . Acute lower UTI 09/01/2017    Past Surgical History:  Procedure Laterality Date  . CHOLECYSTECTOMY    . HIP SURGERY     6 yrs ago  . REFRACTIVE SURGERY    . RETINAL DETACHMENT SURGERY       OB History   No obstetric history on file.      Home Medications    Prior to Admission medications   Medication Sig Start Date End Date Taking? Authorizing Provider  acetaminophen (TYLENOL) 500 MG tablet Take 1,000 mg by mouth every 6 (six) hours as needed for mild pain or moderate pain.     [provider]  amLODipine (NORVASC) 10 MG tablet Take 10 mg by mouth daily.    [provider]  aspirin EC 81 MG tablet Take 81 mg by mouth daily.    [provider]  atorvastatin  (LIPITOR) 40 MG tablet Take 40 mg by mouth at bedtime.     [provider]  Calcium Carb-Cholecalciferol 600-800 MG-UNIT TABS Take 1 tablet by mouth daily.    [provider]  cycloSPORINE (RESTASIS) 0.05 % ophthalmic emulsion Place 1 drop into both eyes 2 (two) times daily.    [provider]  ferrous sulfate 325 (65 FE) MG tablet Take 325 mg by mouth daily with breakfast.    [provider]  folic acid (FOLVITE) 1 MG tablet Take 1 mg by mouth daily.     [provider]  glipiZIDE (GLUCOTROL XL) 5 MG 24 hr tablet Take 5 mg by mouth daily with breakfast.     [provider]  HYDROcodone-acetaminophen (NORCO/VICODIN) 5-325 MG per tablet Take 1 tablet by mouth every 6 (six) hours as needed. Patient taking differently: Take 1 tablet by mouth every 6 (six) hours as needed for moderate pain.  06/16/15   Varney Biles, MD  insulin glargine (LANTUS) 100 UNIT/ML injection Inject 0.18 mLs (18 Units total) into the skin at bedtime. 09/02/17   Leana Gamer, MD  levothyroxine (SYNTHROID, LEVOTHROID) 75 MCG tablet Take 75 mcg by mouth daily before breakfast.    [provider]  losartan (COZAAR) 100 MG tablet  Take 100 mg by mouth daily.    Vincente Liberty, MD  meclizine (ANTIVERT) 25 MG tablet Take 1 tablet (25 mg total) by mouth 3 (three) times daily as needed for dizziness. Patient not taking: Reported on 05/30/2018 10/26/17   Page, Nathan Mali, MD  metoprolol succinate (TOPROL-XL) 50 MG 24 hr tablet Take 50 mg by mouth daily. Take with or immediately following a meal.    [provider]  timolol (TIMOPTIC) 0.5 % ophthalmic solution Place 1 drop into both eyes 2 (two) times daily.    [provider]    Family History Family History  Problem Relation Age of Onset  . Diabetes Mother   . Cancer Father        Prostate  . Diabetes Father   . Cancer Brother   . Diabetes Brother   . Breast cancer Maternal Aunt         does not know age    Social History Social History   Tobacco Use  . Smoking status: Former Research scientist (life sciences)  . Smokeless tobacco: Never Used  Substance Use Topics  . Alcohol use: No  . Drug use: No     Allergies   Bee venom and Nsaids   Review of Systems Review of Systems  All other systems reviewed and are negative.    Physical Exam Updated Vital Signs BP (!) 164/61 (BP Location: Left Arm)   Pulse (!) 55   Temp 98.9 F (37.2 C) (Oral)   Resp 11   Wt 69.4 kg   SpO2 98%   BMI 27.09 kg/m   Physical Exam Vitals signs and nursing note reviewed.  Constitutional:      General: She is not in acute distress.    Appearance: Normal appearance. She is well-developed. She is not toxic-appearing.  HENT:     Head: Normocephalic and atraumatic.  Eyes:     General: Lids are normal.     Conjunctiva/sclera: Conjunctivae normal.     Pupils: Pupils are equal, round, and reactive to light.  Neck:     Musculoskeletal: Normal range of motion and neck supple.     Thyroid: No thyroid mass.     Trachea: No tracheal deviation.  Cardiovascular:     Rate and Rhythm: Normal rate and regular rhythm.     Heart sounds: Normal heart sounds. No murmur. No gallop.   Pulmonary:     Effort: Pulmonary effort is normal. No respiratory distress.     Breath sounds: Normal breath sounds. No stridor. No decreased breath sounds, wheezing, rhonchi or rales.  Abdominal:     General: Bowel sounds are normal. There is no distension.     Palpations: Abdomen is soft.     Tenderness: There is no abdominal tenderness. There is no rebound.  Musculoskeletal: Normal range of motion.        General: No tenderness.  Skin:    General: Skin is warm and dry.     Findings: No abrasion or rash.  Neurological:     Mental Status: She is alert and oriented to person, place, and time.     GCS: GCS eye subscore is 4. GCS verbal subscore is 5. GCS motor subscore is 6.     Cranial Nerves: No cranial nerve deficit.      Sensory: No sensory deficit.  Psychiatric:        Speech: Speech normal.        Behavior: Behavior normal.      ED Treatments / Results  Labs (all labs ordered are listed, but only abnormal results are displayed) Labs Reviewed  COMPREHENSIVE METABOLIC PANEL  CBC WITH DIFFERENTIAL/PLATELET  RETICULOCYTES    EKG None  Radiology No results found.  Procedures Procedures (including critical care time)  Medications Ordered in ED Medications  sodium chloride flush (NS) 0.9 % injection 3 mL (has no administration in time range)  HYDROmorphone (DILAUDID) injection 0.5 mg (has no administration in time range)    Or  HYDROmorphone (DILAUDID) injection 0.5 mg (has no administration in time range)  HYDROmorphone (DILAUDID) injection 1 mg (has no administration in time range)    Or  HYDROmorphone (DILAUDID) injection 1 mg (has no administration in time range)  HYDROmorphone (DILAUDID) injection 1 mg (has no administration in time range)    Or  HYDROmorphone (DILAUDID) injection 1 mg (has no administration in time range)  HYDROmorphone (DILAUDID) injection 1 mg (has no administration in time range)    Or  HYDROmorphone (DILAUDID) injection 1 mg (has no administration in time range)     Initial Impression / Assessment and Plan / ED Course  I have reviewed the triage vital signs and the nursing notes.  Pertinent labs & imaging results that were available during my care of the patient were reviewed by me and considered in my medical decision making (see chart for details).        Patient will be treated with IV Dilaudid here.  We will also give IV fluids and discussed with Dr. Doreene Burke who will admit the patient  Final Clinical Impressions(s) / ED Diagnoses   Final diagnoses:  None    ED Discharge Orders    None       Lacretia Leigh, MD 06/11/19 1352

## 2019-06-11 NOTE — Progress Notes (Signed)
Patient sinus brady on heart monitor with HR 45. Patient denies chest pain or discomfort pain rating pain 3/10 and patient appears comfortable.  Dr. Doreene Burke notified

## 2019-06-11 NOTE — H&P (Signed)
H&P  Patient Demographics:  [name], is a 73 y.o. female  MRN: [id number]   DOB - [birth year]  Admit Date - 06/11/2019   Outpatient Primary MD for the patient is Vincente Liberty, MD  Chief Complaint  Patient presents with  . Sickle Cell Pain Crisis     HPI:   [name]  is a 73 y.o. very pleasant female with medical history significant for sickle cell anemia hemoglobin [place], type 2 diabetes mellitus on Lantus insulin, essential hypertension, chronic CKD stage IV not followed by nephrologist, hypothyroidism, glaucoma and coronary artery disease who presented to the emergency room for the third time this week with major complaints of pain that has been moving around from her lower back limbs to the back and to her shoulders.  This pain is typical of her sickle cell pain crisis but she has been unable to shake it off as she does usually.  She rates the pain at 10/10 when she arrived this morning, by the time of this encounter 8/10.  She describes the pain as throbbing, achy and constant, giving her difficulties to find position of relief. She denies any fever, cough, chest pain, shortness of breath, nausea, vomiting or diarrhea.  No urinary symptoms, no change in bowel habits. Although patient claims she has had nausea since after receiving doses of Dilaudid in the emergency room.  ED course: She has elevated systolic blood pressure otherwise vitals were within normal limit, hemoglobin was 9.6 which was consistent with her baseline, serum creatinine of 1.76 with a GFR of 33 slightly above her baseline of 1.6 and GFR of around 40.  Chest x-ray in the last few days showed no active cardiopulmonary disease. Today her COVID-19 screening was negative.  Because of patient's age, and infrequent admissions or ED utilization now presenting with uncontrolled pain, followed by in the emergency room in 1 week, as well as worsening kidney function, patient will be admitted to inpatient for rehydration and pain  control.  Review of systems:  In addition to the HPI above, patient reports No fever or chills No Headache, No changes with vision or hearing No problems swallowing food or liquids No chest pain, cough or shortness of breath No Abdominal pain, No Nausea or Vomiting, Bowel movements are regular No blood in stool or urine No dysuria No new skin rashes or bruises No new weakness, tingling, numbness in any extremity No recent weight gain or loss No polyuria, polydypsia or polyphagia No significant Mental Stressors  A full 10 point Review of Systems was done, except as stated above, all other Review of Systems were negative.  With Past History of the following :   Past Medical History:  Diagnosis Date  . Abdominal pain   . Coronary artery disease   . Diabetes mellitus without complication ([place])   . Glaucoma   . Hypertension   . Hypothyroid   . Renal insufficiency 09/01/2017  . Sickle cell anemia ([facility])       Past Surgical History:  Procedure Laterality Date  . CHOLECYSTECTOMY    . HIP SURGERY     6 yrs ago  . REFRACTIVE SURGERY    . RETINAL DETACHMENT SURGERY       Social History:   Social History   Tobacco Use  . Smoking status: Former Research scientist (life sciences)  . Smokeless tobacco: Never Used  Substance Use Topics  . Alcohol use: No     Lives - At home   Family History :   Family  History  Problem Relation Age of Onset  . Diabetes Mother   . Cancer Father        Prostate  . Diabetes Father   . Cancer Brother   . Diabetes Brother   . Breast cancer Maternal Aunt        does not know age     Home Medications:   Prior to Admission medications   Medication Sig Start Date End Date Taking? Authorizing Provider  acetaminophen (TYLENOL) 500 MG tablet Take 1,000 mg by mouth every 6 (six) hours as needed for mild pain or moderate pain.     [provider]  amLODipine (NORVASC) 10 MG tablet Take 10 mg by mouth daily.    [provider]  aspirin EC 81 MG tablet  Take 81 mg by mouth daily.    [provider]  atorvastatin (LIPITOR) 40 MG tablet Take 40 mg by mouth at bedtime.     [provider]  Calcium Carb-Cholecalciferol 600-800 MG-UNIT TABS Take 1 tablet by mouth daily.    [provider]  cycloSPORINE (RESTASIS) 0.05 % ophthalmic emulsion Place 1 drop into both eyes 2 (two) times daily.    [provider]  ferrous sulfate 325 (65 FE) MG tablet Take 325 mg by mouth daily with breakfast.    [provider]  folic acid (FOLVITE) 1 MG tablet Take 1 mg by mouth daily.     [provider]  glipiZIDE (GLUCOTROL XL) 5 MG 24 hr tablet Take 5 mg by mouth daily with breakfast.     [provider]  HYDROcodone-acetaminophen (NORCO/VICODIN) 5-325 MG per tablet Take 1 tablet by mouth every 6 (six) hours as needed. Patient taking differently: Take 1 tablet by mouth every 6 (six) hours as needed for moderate pain.  06/16/15   Varney Biles, MD  insulin glargine (LANTUS) 100 UNIT/ML injection Inject 0.18 mLs (18 Units total) into the skin at bedtime. 09/02/17   Leana Gamer, MD  levothyroxine (SYNTHROID, LEVOTHROID) 75 MCG tablet Take 75 mcg by mouth daily before breakfast.    [provider]  losartan (COZAAR) 100 MG tablet Take 100 mg by mouth daily.    Vincente Liberty, MD  metoprolol succinate (TOPROL-XL) 50 MG 24 hr tablet Take 50 mg by mouth daily. Take with or immediately following a meal.    [provider]  timolol (TIMOPTIC) 0.5 % ophthalmic solution Place 1 drop into both eyes 2 (two) times daily.    [provider]     Allergies:   Allergies  Allergen Reactions  . Bee Venom Swelling  . Nsaids Other (See Comments)    Dyspepsia. Advised by Primary Provider to avoid because of Kidneys     Physical Exam:   Vitals:   Vitals:   06/11/19 1140 06/11/19 1349  BP: (!) 143/67 (!) 164/61  Pulse: (!) 57 (!) 55  Resp: 18 11  Temp: 98.9 F (37.2 C)    SpO2: 97% 98%    Physical Exam: Constitutional: Patient appears well-developed and well-nourished. Not in obvious distress. HENT: Normocephalic, atraumatic, External right and left ear normal. Oropharynx is clear and moist.  Eyes: Conjunctivae and EOM are normal. PERRLA, no scleral icterus. Neck: Normal ROM. Neck supple. No JVD. No tracheal deviation. No thyromegaly. CVS: RRR, S1/S2 +, no murmurs, no gallops, no carotid bruit.  Pulmonary: Effort and breath sounds normal, no stridor, rhonchi, wheezes, rales.  Abdominal: Soft. BS +, no distension, tenderness, rebound or guarding.  Musculoskeletal: Normal  range of motion. No edema and no tenderness.  Lymphadenopathy: No lymphadenopathy noted, cervical, inguinal or axillary Neuro: Alert. Normal reflexes, muscle tone coordination. No cranial nerve deficit. Skin: Skin is warm and dry. No rash noted. Not diaphoretic. No erythema. No pallor. Psychiatric: Normal mood and affect. Behavior, judgment, thought content normal.   Data Review:   CBC Recent Labs  Lab 06/07/19 1244 06/10/19 0740  WBC 11.8* 15.9*  HGB 10.0* 9.5*  HCT 29.8* 27.8*  PLT 228 198  MCV 85.9 86.3  MCH 28.8 29.5  MCHC 33.6 34.2  RDW 16.8* 16.9*  LYMPHSABS 1.5 1.8  MONOABS 0.8 1.5*  EOSABS 0.0 0.2  BASOSABS 0.0 0.1   ------------------------------------------------------------------------------------------------------------------  Chemistries  Recent Labs  Lab 06/07/19 1244 06/10/19 0740  NA 139 138  K 5.0 4.7  CL 110 108  CO2 20* 22  GLUCOSE 183* 128*  BUN 35* 39*  CREATININE 1.66* 1.71*  CALCIUM 9.8 9.3  AST 23 25  ALT 17 19  ALKPHOS 100 91  BILITOT 1.6* 1.3*   ------------------------------------------------------------------------------------------------------------------ estimated creatinine clearance is 28.6 mL/min (A) (by C-G formula based on SCr of 1.71 mg/dL  (H)). ------------------------------------------------------------------------------------------------------------------ No results for input(s): TSH, T4TOTAL, T3FREE, THYROIDAB in the last 72 hours.  Invalid input(s): FREET3  Coagulation profile No results for input(s): INR, PROTIME in the last 168 hours. ------------------------------------------------------------------------------------------------------------------- No results for input(s): DDIMER in the last 72 hours. -------------------------------------------------------------------------------------------------------------------  Cardiac Enzymes No results for input(s): CKMB, TROPONINI, MYOGLOBIN in the last 168 hours.  Invalid input(s): CK ------------------------------------------------------------------------------------------------------------------ No results found for: BNP  ---------------------------------------------------------------------------------------------------------------  Urinalysis    Component Value Date/Time   COLORURINE YELLOW 08/31/2017 2350   APPEARANCEUR CLEAR 08/31/2017 2350   LABSPEC 1.011 08/31/2017 2350   PHURINE 5.0 08/31/2017 2350   GLUCOSEU NEGATIVE 08/31/2017 2350   HGBUR NEGATIVE 08/31/2017 2350   BILIRUBINUR NEGATIVE 08/31/2017 2350   KETONESUR NEGATIVE 08/31/2017 2350   PROTEINUR 100 (A) 08/31/2017 2350   UROBILINOGEN 1.0 02/21/2011 1127   NITRITE POSITIVE (A) 08/31/2017 2350   LEUKOCYTESUR MODERATE (A) 08/31/2017 2350    ----------------------------------------------------------------------------------------------------------------   Imaging Results:   My personal review of EKG and chest x-ray, no acute findings   Assessment & Plan:  Active Problems:   Sickle cell anemia with crisis ([place])  1. Hb Sickle Cell Disease with crisis: Admit, start IVF half saline @ 100 mls/hour, start weight based Dilaudid PCA, IV Toradol is contraindicated due to CKD, monitor vitals very closely,  Re-evaluate pain scale regularly, 2 L of Oxygen by [place], Patient will be re-evaluated for pain in the context of function and relationship to baseline as care progresses. 2. Acute on CKD stage IV: Gentle rehydration, avoid all nephrotoxins.  Patient counseled.  Educated and encouraged to discuss with PCP for referral to nephrologist. 3. Leukocytosis: Mild, no fever and no other evidence of infection, will monitor closely, check labs again in the morning 4. Sickle Cell Anemia: Hemoglobin is at baseline, no clinical indication for blood transfusion today.  Repeat labs in a.m. 5. Essential hypertension: Restart home medication 6. Type 2 diabetes mellitus: Restart home insulin regimen, cover with SSI 7. Chronic pain Syndrome: Restart home pain medication 8. Glaucoma: Continue home medication  DVT Prophylaxis: Subcut Lovenox   AM Labs Ordered, also please review Full Orders  Family Communication: Admission, patient's condition and plan of care including tests being ordered have been discussed with the patient who indicate understanding and agree with the plan and Code Status.  Code Status: Full Code  Consults called: None    Admission status: Inpatient    Time spent in minutes : 50 minutes  Angelica Chessman MD, MHA, CPE, FACP 06/11/2019 at 2:11 PM

## 2019-06-12 DIAGNOSIS — D638 Anemia in other chronic diseases classified elsewhere: Secondary | ICD-10-CM

## 2019-06-12 LAB — CBC WITH DIFFERENTIAL/PLATELET
Abs Immature Granulocytes: 0.07 10*3/uL (ref 0.00–0.07)
Basophils Absolute: 0 10*3/uL (ref 0.0–0.1)
Basophils Relative: 0 %
Eosinophils Absolute: 0.1 10*3/uL (ref 0.0–0.5)
Eosinophils Relative: 1 %
HCT: 24.2 % — ABNORMAL LOW (ref 36.0–46.0)
Hemoglobin: 8.3 g/dL — ABNORMAL LOW (ref 12.0–15.0)
Immature Granulocytes: 1 %
Lymphocytes Relative: 21 %
Lymphs Abs: 2.7 10*3/uL (ref 0.7–4.0)
MCH: 29.6 pg (ref 26.0–34.0)
MCHC: 34.3 g/dL (ref 30.0–36.0)
MCV: 86.4 fL (ref 80.0–100.0)
Monocytes Absolute: 1.3 10*3/uL — ABNORMAL HIGH (ref 0.1–1.0)
Monocytes Relative: 11 %
Neutro Abs: 8.5 10*3/uL — ABNORMAL HIGH (ref 1.7–7.7)
Neutrophils Relative %: 66 %
Platelets: 174 10*3/uL (ref 150–400)
RBC: 2.8 MIL/uL — ABNORMAL LOW (ref 3.87–5.11)
RDW: 16.7 % — ABNORMAL HIGH (ref 11.5–15.5)
WBC: 12.8 10*3/uL — ABNORMAL HIGH (ref 4.0–10.5)
nRBC: 0.7 % — ABNORMAL HIGH (ref 0.0–0.2)

## 2019-06-12 LAB — COMPREHENSIVE METABOLIC PANEL
ALT: 19 U/L (ref 0–44)
AST: 20 U/L (ref 15–41)
Albumin: 3.8 g/dL (ref 3.5–5.0)
Alkaline Phosphatase: 77 U/L (ref 38–126)
Anion gap: 9 (ref 5–15)
BUN: 32 mg/dL — ABNORMAL HIGH (ref 8–23)
CO2: 22 mmol/L (ref 22–32)
Calcium: 9 mg/dL (ref 8.9–10.3)
Chloride: 108 mmol/L (ref 98–111)
Creatinine, Ser: 1.56 mg/dL — ABNORMAL HIGH (ref 0.44–1.00)
GFR calc Af Amer: 39 mL/min — ABNORMAL LOW (ref >60)
GFR calc non Af Amer: 33 mL/min — ABNORMAL LOW (ref >60)
Glucose, Bld: 164 mg/dL — ABNORMAL HIGH (ref 70–99)
Potassium: 4.2 mmol/L (ref 3.5–5.1)
Sodium: 139 mmol/L (ref 135–145)
Total Bilirubin: 1 mg/dL (ref 0.3–1.2)
Total Protein: 7.1 g/dL (ref 6.5–8.1)

## 2019-06-12 LAB — GLUCOSE, CAPILLARY
Glucose-Capillary: 128 mg/dL — ABNORMAL HIGH (ref 70–99)
Glucose-Capillary: 152 mg/dL — ABNORMAL HIGH (ref 70–99)
Glucose-Capillary: 163 mg/dL — ABNORMAL HIGH (ref 70–99)
Glucose-Capillary: 187 mg/dL — ABNORMAL HIGH (ref 70–99)

## 2019-06-12 LAB — HEMOGLOBIN A1C
Hgb A1c MFr Bld: 4.4 % — ABNORMAL LOW (ref 4.8–5.6)
Mean Plasma Glucose: 79.58 mg/dL

## 2019-06-12 NOTE — Progress Notes (Signed)
Patient ID: [name], female   DOB: [birth year], 73 y.o.   MRN: [id number] Subjective: [name]  is a 73 y.o. very pleasant female with medical history significant for sickle cell anemia hemoglobin [place], type 2 diabetes mellitus on Lantus insulin, essential hypertension, chronic CKD stage IV not followed by nephrologist, hypothyroidism, glaucoma and coronary artery disease who was admitted yesterday for sickle cell pain crisis.  Today, patient feels slightly better.  Pain has reduced but still significant.  She rates her pain at 7/10, localized to her lower back.  It appeared the pain moved from her legs to her back but she does feel better than yesterday.  She denies any fever, chest pain, shortness of breath, nausea, vomiting or diarrhea.  She has no urinary symptoms.  Objective:  Vital signs in last 24 hours:  Vitals:   06/12/19 0051 06/12/19 0426 06/12/19 0507 06/12/19 0818  BP:  (!) 145/63    Pulse:  (!) 58    Resp: 11 19 13 13   Temp:  98.7 F (37.1 C)    TempSrc:  Oral    SpO2: 98% 98% 98% 98%  Weight:      Height:        Intake/Output from previous day:   Intake/Output Summary (Last 24 hours) at 06/12/2019 1036 Last data filed at 06/12/2019 0600 Gross per 24 hour  Intake 1660.97 ml  Output 1500 ml  Net 160.97 ml    Physical Exam: General: Elderly woman, pleasant, alert, awake, oriented x3, in no acute distress.  HEENT: [place]/AT [place]ER[place], EOMI Neck: Trachea midline,  no masses, no thyromegal,y no JVD, no carotid bruit OROPHARYNX:  Moist, No exudate/ erythema/lesions.  Heart: Regular rate and rhythm, without murmurs, rubs, gallops, PMI non-displaced, no heaves or thrills on palpation.  Lungs: Clear to auscultation, no wheezing or rhonchi noted. No increased vocal fremitus resonant to percussion  Abdomen: Soft, nontender, nondistended, positive bowel sounds, no masses no hepatosplenomegaly noted..  Neuro: No focal neurological deficits noted cranial nerves II through XII  grossly intact. DTRs 2+ bilaterally upper and lower extremities. Strength 5 out of 5 in bilateral upper and lower extremities. Musculoskeletal: No warm swelling or erythema around joints, no spinal tenderness noted. Psychiatric: Patient alert and oriented x3, good insight and cognition, good recent to remote recall. Lymph node survey: No cervical axillary or inguinal lymphadenopathy noted.  Lab Results:  Basic Metabolic Panel:    Component Value Date/Time   NA 139 06/12/2019 0604   K 4.2 06/12/2019 0604   CL 108 06/12/2019 0604   CO2 22 06/12/2019 0604   BUN 32 (H) 06/12/2019 0604   CREATININE 1.56 (H) 06/12/2019 0604   GLUCOSE 164 (H) 06/12/2019 0604   CALCIUM 9.0 06/12/2019 0604   CBC:    Component Value Date/Time   WBC 12.8 (H) 06/12/2019 0604   HGB 8.3 (L) 06/12/2019 0604   HCT 24.2 (L) 06/12/2019 0604   PLT 174 06/12/2019 0604   MCV 86.4 06/12/2019 0604   NEUTROABS 8.5 (H) 06/12/2019 0604   LYMPHSABS 2.7 06/12/2019 0604   MONOABS 1.3 (H) 06/12/2019 0604   EOSABS 0.1 06/12/2019 0604   BASOSABS 0.0 06/12/2019 0604    Recent Results (from the past 240 hour(s))  SARS Coronavirus 2 (CEPHEID - Performed in [place] hospital lab), Hosp Order     Status: None   Collection Time: 06/11/19  1:53 PM   Specimen: Nasopharyngeal Swab  Result Value Ref Range Status   SARS Coronavirus 2 NEGATIVE NEGATIVE Final  Comment: (NOTE) If result is NEGATIVE SARS-CoV-2 target nucleic acids are NOT DETECTED. The SARS-CoV-2 RNA is generally detectable in upper and lower  respiratory specimens during the acute phase of infection. The lowest  concentration of SARS-CoV-2 viral copies this assay can detect is 250  copies / mL. A negative result does not preclude SARS-CoV-2 infection  and should not be used as the sole basis for treatment or other  patient management decisions.  A negative result may occur with  improper specimen collection / handling, submission of specimen other  than  nasopharyngeal swab, presence of viral mutation(s) within the  areas targeted by this assay, and inadequate number of viral copies  (<250 copies / mL). A negative result must be combined with clinical  observations, patient history, and epidemiological information. If result is POSITIVE SARS-CoV-2 target nucleic acids are DETECTED. The SARS-CoV-2 RNA is generally detectable in upper and lower  respiratory specimens dur ing the acute phase of infection.  Positive  results are indicative of active infection with SARS-CoV-2.  Clinical  correlation with patient history and other diagnostic information is  necessary to determine patient infection status.  Positive results do  not rule out bacterial infection or co-infection with other viruses. If result is PRESUMPTIVE POSTIVE SARS-CoV-2 nucleic acids MAY BE PRESENT.   A presumptive positive result was obtained on the submitted specimen  and confirmed on repeat testing.  While 2019 novel coronavirus  (SARS-CoV-2) nucleic acids may be present in the submitted sample  additional confirmatory testing may be necessary for epidemiological  and / or clinical management purposes  to differentiate between  SARS-CoV-2 and other Sarbecovirus currently known to infect humans.  If clinically indicated additional testing with an alternate test  methodology [phone]) is advised. The SARS-CoV-2 RNA is generally  detectable in upper and lower respiratory sp ecimens during the acute  phase of infection. The expected result is Negative. Fact Sheet for Patients:  StrictlyIdeas.no Fact Sheet for Healthcare Providers: BankingDealers.co.za This test is not yet approved or cleared by the [place] FDA and has been authorized for detection and/or diagnosis of SARS-CoV-2 by FDA under an Emergency Use Authorization (EUA).  This EUA will remain in effect (meaning this test can be used) for the duration of  the COVID-19 declaration under Section 564(b)(1) of the Act, 21 U.S.C. section 360bbb-3(b)(1), unless the authorization is terminated or revoked sooner. Performed at [facility], [place] [address] [place], [place] [address]     Studies/Results: No results found.  Medications: Scheduled Meds: . amLODipine  10 mg Oral Daily  . aspirin EC  81 mg Oral Daily  . atorvastatin  40 mg Oral QHS  . calcium-vitamin D  1 tablet Oral Q breakfast  . cycloSPORINE  1 drop Both Eyes BID  . enoxaparin (LOVENOX) injection  30 mg Subcutaneous Q24H  . ferrous sulfate  325 mg Oral Q breakfast  . folic acid  1 mg Oral Daily  . glipiZIDE  5 mg Oral Q breakfast  . HYDROmorphone   Intravenous Q4H  . insulin aspart  0-9 Units Subcutaneous TID WC  . insulin glargine  18 Units Subcutaneous QHS  . levothyroxine  75 mcg Oral QAC breakfast  . losartan  100 mg Oral Daily  . metoprolol succinate  50 mg Oral Daily  . senna-docusate  1 tablet Oral BID  . timolol  1 drop Both Eyes BID   Continuous Infusions: . sodium chloride 100 mL/hr at 06/12/19 0342  . diphenhydrAMINE  PRN Meds:.diphenhydrAMINE **OR** diphenhydrAMINE, HYDROcodone-acetaminophen, naloxone **AND** sodium chloride flush, ondansetron (ZOFRAN) IV, polyethylene glycol  Assessment/Plan: Active Problems:   Sickle cell anemia with crisis ([place])  1. Hb Sickle Cell Disease with crisis: Reduce IVF to 50 mls/hour, continue weight based Dilaudid PCA, IV Toradol is contraindicated due to CKD, Monitor vitals very closely, Re-evaluate pain scale regularly, 2 L of Oxygen by [place]. 2. Acute on CKD stage IV: Serum creatinine now at baseline, significant improvement after gentle hydration. Continue to avoid all nephrotoxins. 3. Leukocytosis: Patient has no fever, and no evidence of infection or inflammation to justify use of antibiotics. We will continue to monitor. 4. Sickle Cell Anemia: Hemoglobin dropped one-point to 8.3 from 9.6 but still  around baseline, could be combination of hemodilution and sickle cell crisis.  We will continue to monitor. 5. Chronic pain Syndrome: Continue home medications 6. Type 2 diabetes mellitus. Continue Lantus insulin and SSI. Monitor blood glucose per protocol. 7. Essential hypertension: Blood pressure now controlled. Continue home medications 8. Glaucoma: Continue home medications.  Code Status: Full Code Family Communication: Discussed extensively with patient's husband who is worried that patient has been to the ED 3 times in 1 week. All his questions answered and his concerns addressed. He would like to see a change in patients home pain medication regimen. Patient and husband encouraged to discuss change in regimen with PCP after discharge from the hospital. Disposition Plan: Not yet ready for discharge  [name]  If 7PM-7AM, please contact night-coverage.  06/12/2019, 10:36 AM  LOS: 1 day

## 2019-06-13 DIAGNOSIS — D5700 Hb-SS disease with crisis, unspecified: Secondary | ICD-10-CM

## 2019-06-13 LAB — CBC
HCT: 24.4 % — ABNORMAL LOW (ref 36.0–46.0)
Hemoglobin: 8.3 g/dL — ABNORMAL LOW (ref 12.0–15.0)
MCH: 29.4 pg (ref 26.0–34.0)
MCHC: 34 g/dL (ref 30.0–36.0)
MCV: 86.5 fL (ref 80.0–100.0)
Platelets: 172 10*3/uL (ref 150–400)
RBC: 2.82 MIL/uL — ABNORMAL LOW (ref 3.87–5.11)
RDW: 17 % — ABNORMAL HIGH (ref 11.5–15.5)
WBC: 12.8 10*3/uL — ABNORMAL HIGH (ref 4.0–10.5)
nRBC: 1.2 % — ABNORMAL HIGH (ref 0.0–0.2)

## 2019-06-13 LAB — GLUCOSE, CAPILLARY
Glucose-Capillary: 138 mg/dL — ABNORMAL HIGH (ref 70–99)
Glucose-Capillary: 207 mg/dL — ABNORMAL HIGH (ref 70–99)
Glucose-Capillary: 119 mg/dL — ABNORMAL HIGH (ref 70–99)
Glucose-Capillary: 114 mg/dL — ABNORMAL HIGH (ref 70–99)

## 2019-06-13 LAB — BASIC METABOLIC PANEL
Anion gap: 7 (ref 5–15)
BUN: 28 mg/dL — ABNORMAL HIGH (ref 8–23)
CO2: 22 mmol/L (ref 22–32)
Calcium: 9.3 mg/dL (ref 8.9–10.3)
Chloride: 110 mmol/L (ref 98–111)
Creatinine, Ser: 1.67 mg/dL — ABNORMAL HIGH (ref 0.44–1.00)
GFR calc Af Amer: 36 mL/min — ABNORMAL LOW (ref >60)
GFR calc non Af Amer: 31 mL/min — ABNORMAL LOW (ref >60)
Glucose, Bld: 157 mg/dL — ABNORMAL HIGH (ref 70–99)
Potassium: 5.1 mmol/L (ref 3.5–5.1)
Sodium: 139 mmol/L (ref 135–145)

## 2019-06-13 LAB — HIV ANTIBODY (ROUTINE TESTING W REFLEX): HIV Screen 4th Generation wRfx: NONREACTIVE

## 2019-06-13 NOTE — Progress Notes (Signed)
Subjective: [name], a 73 year old female with a medical history significant for sickle cell disease, type [place], type 2 diabetes mellitus, essential hypertension, chronic kidney disease stage IV, hypothyroidism, glaucoma, and coronary artery disease was admitted for sickle cell pain crisis.  Patient states that pain is improved some overnight.  However pain remains significant primarily to right flank.  Patient has not attempted to get out of bed without assistance.  Pain intensity is 5/10.  Patient states that she does not think she can manage at home at this point. Patient is afebrile and maintaining oxygen saturation at 100% on 2 L. She denies dizziness, paresthesias, chest pains, dysuria, nausea, vomiting, or diarrhea.  Objective:  Vital signs in last 24 hours:  Vitals:   06/13/19 0754 06/13/19 0813 06/13/19 1013 06/13/19 1016  BP: 137/71  (!) 117/55 (!) 117/55  Pulse: (!) 56   (!) 55  Resp: 12 17    Temp: 98.6 F (37 C)     TempSrc: Oral     SpO2: 98% 98%    Weight:      Height:        Intake/Output from previous day:   Intake/Output Summary (Last 24 hours) at 06/13/2019 1151 Last data filed at 06/13/2019 0948 Gross per 24 hour  Intake 1165.5 ml  Output 1450 ml  Net -284.5 ml    Physical Exam: General: Alert, awake, oriented x3, in no acute distress.  HEENT: [place]/AT [place], EOMI Neck: Trachea midline,  no masses, no thyromegal,y no JVD, no carotid bruit OROPHARYNX:  Moist, No exudate/ erythema/lesions.  Heart: Regular rate and rhythm, without murmurs, rubs, gallops, PMI non-displaced, no heaves or thrills on palpation.  Lungs: Clear to auscultation, no wheezing or rhonchi noted. No increased vocal fremitus resonant to percussion  Abdomen: Soft, nontender, nondistended, positive bowel sounds, no masses no hepatosplenomegaly noted..  Neuro: No focal neurological deficits noted cranial nerves II through XII grossly intact. DTRs 2+ bilaterally upper and lower extremities.  Strength 5 out of 5 in bilateral upper and lower extremities. Musculoskeletal: No warm swelling or erythema around joints, no spinal tenderness noted. Psychiatric: Patient alert and oriented x3, good insight and cognition, good recent to remote recall. Lymph node survey: No cervical axillary or inguinal lymphadenopathy noted.  Lab Results:  Basic Metabolic Panel:    Component Value Date/Time   NA 139 06/13/2019 0915   K 5.1 06/13/2019 0915   CL 110 06/13/2019 0915   CO2 22 06/13/2019 0915   BUN 28 (H) 06/13/2019 0915   CREATININE 1.67 (H) 06/13/2019 0915   GLUCOSE 157 (H) 06/13/2019 0915   CALCIUM 9.3 06/13/2019 0915   CBC:    Component Value Date/Time   WBC 12.8 (H) 06/13/2019 0915   HGB 8.3 (L) 06/13/2019 0915   HCT 24.4 (L) 06/13/2019 0915   PLT 172 06/13/2019 0915   MCV 86.5 06/13/2019 0915   NEUTROABS 8.5 (H) 06/12/2019 0604   LYMPHSABS 2.7 06/12/2019 0604   MONOABS 1.3 (H) 06/12/2019 0604   EOSABS 0.1 06/12/2019 0604   BASOSABS 0.0 06/12/2019 0604    Recent Results (from the past 240 hour(s))  SARS Coronavirus 2 (CEPHEID - Performed in [place] hospital lab), Hosp Order     Status: None   Collection Time: 06/11/19  1:53 PM   Specimen: Nasopharyngeal Swab  Result Value Ref Range Status   SARS Coronavirus 2 NEGATIVE NEGATIVE Final    Comment: (NOTE) If result is NEGATIVE SARS-CoV-2 target nucleic acids are NOT DETECTED. The SARS-CoV-2 RNA  is generally detectable in upper and lower  respiratory specimens during the acute phase of infection. The lowest  concentration of SARS-CoV-2 viral copies this assay can detect is 250  copies / mL. A negative result does not preclude SARS-CoV-2 infection  and should not be used as the sole basis for treatment or other  patient management decisions.  A negative result may occur with  improper specimen collection / handling, submission of specimen other  than nasopharyngeal swab, presence of viral mutation(s) within the   areas targeted by this assay, and inadequate number of viral copies  (<250 copies / mL). A negative result must be combined with clinical  observations, patient history, and epidemiological information. If result is POSITIVE SARS-CoV-2 target nucleic acids are DETECTED. The SARS-CoV-2 RNA is generally detectable in upper and lower  respiratory specimens dur ing the acute phase of infection.  Positive  results are indicative of active infection with SARS-CoV-2.  Clinical  correlation with patient history and other diagnostic information is  necessary to determine patient infection status.  Positive results do  not rule out bacterial infection or co-infection with other viruses. If result is PRESUMPTIVE POSTIVE SARS-CoV-2 nucleic acids MAY BE PRESENT.   A presumptive positive result was obtained on the submitted specimen  and confirmed on repeat testing.  While 2019 novel coronavirus  (SARS-CoV-2) nucleic acids may be present in the submitted sample  additional confirmatory testing may be necessary for epidemiological  and / or clinical management purposes  to differentiate between  SARS-CoV-2 and other Sarbecovirus currently known to infect humans.  If clinically indicated additional testing with an alternate test  methodology [phone]) is advised. The SARS-CoV-2 RNA is generally  detectable in upper and lower respiratory sp ecimens during the acute  phase of infection. The expected result is Negative. Fact Sheet for Patients:  StrictlyIdeas.no Fact Sheet for Healthcare Providers: BankingDealers.co.za This test is not yet approved or cleared by the [place] FDA and has been authorized for detection and/or diagnosis of SARS-CoV-2 by FDA under an Emergency Use Authorization (EUA).  This EUA will remain in effect (meaning this test can be used) for the duration of the COVID-19 declaration under Section 564(b)(1) of the Act, 21  U.S.C. section 360bbb-3(b)(1), unless the authorization is terminated or revoked sooner. Performed at [facility], [place] [address], [place], [place] [address]     Studies/Results: No results found.  Medications: Scheduled Meds: . amLODipine  10 mg Oral Daily  . aspirin EC  81 mg Oral Daily  . atorvastatin  40 mg Oral QHS  . calcium-vitamin D  1 tablet Oral Q breakfast  . cycloSPORINE  1 drop Both Eyes BID  . enoxaparin (LOVENOX) injection  30 mg Subcutaneous Q24H  . ferrous sulfate  325 mg Oral Q breakfast  . folic acid  1 mg Oral Daily  . glipiZIDE  5 mg Oral Q breakfast  . HYDROmorphone   Intravenous Q4H  . insulin aspart  0-9 Units Subcutaneous TID WC  . insulin glargine  18 Units Subcutaneous QHS  . levothyroxine  75 mcg Oral QAC breakfast  . losartan  100 mg Oral Daily  . metoprolol succinate  50 mg Oral Daily  . senna-docusate  1 tablet Oral BID  . timolol  1 drop Both Eyes BID   Continuous Infusions: . sodium chloride 50 mL/hr at 06/13/19 0543  . diphenhydrAMINE     PRN Meds:.diphenhydrAMINE **OR** diphenhydrAMINE, HYDROcodone-acetaminophen, naloxone **AND** sodium chloride flush, ondansetron (ZOFRAN) IV,  polyethylene glycol  Assessment/Plan: Active Problems:   Sickle cell anemia with crisis (H[facility])  Sickle cell disease with pain crisis: Continue IV fluids at 75 mL/h Weight-based Dilaudid via PCA, no change in settings on today. IV Toradol contraindicated due to history of CKD. Continue to monitor vitals very closely Maintain oxygen saturation above 90%, 2 L supplemental oxygen as needed  Acute on chronic CKD stage IV: Serum creatinine 1.67, which is increased from yesterday.  Continue gentle hydration.  Increase IV fluids to 75 mL/h.  Avoid all nephrotoxic medications.  Leukocytosis: Stable.  Patient afebrile and no evidence of infection or inflammation.  Continue to monitor closely.  Follow CBC.  Sickle cell anemia: Hemoglobin 8.3,  consistent with previous.  No clinical indication for blood transfusion at this time.  Continue to follow CBC.  Chronic pain syndrome: Continue home medications.  Type 2 diabetes mellitus: Continue Lantus and SSI.  Monitor blood glucose prior to meals and at bedtime.  Essential hypertension: Stable.  Continue home medications.  Glaucoma: Continue home medications   Code Status: Full Code Family Communication: N/A Disposition Plan: Not yet ready for discharge  [place], MSN, FNP-C [facility] [address] [place], [place] [address] [phone] If 5PM-7AM, please contact night-coverage.  06/13/2019, 11:51 AM  LOS: 2 days

## 2019-06-14 LAB — BASIC METABOLIC PANEL
Anion gap: 10 (ref 5–15)
BUN: 23 mg/dL (ref 8–23)
CO2: 21 mmol/L — ABNORMAL LOW (ref 22–32)
Calcium: 9.8 mg/dL (ref 8.9–10.3)
Chloride: 108 mmol/L (ref 98–111)
Creatinine, Ser: 1.31 mg/dL — ABNORMAL HIGH (ref 0.44–1.00)
GFR calc Af Amer: 48 mL/min — ABNORMAL LOW (ref >60)
GFR calc non Af Amer: 41 mL/min — ABNORMAL LOW (ref >60)
Glucose, Bld: 71 mg/dL (ref 70–99)
Potassium: 4.1 mmol/L (ref 3.5–5.1)
Sodium: 139 mmol/L (ref 135–145)

## 2019-06-14 LAB — CBC
HCT: 24.9 % — ABNORMAL LOW (ref 36.0–46.0)
Hemoglobin: 8.6 g/dL — ABNORMAL LOW (ref 12.0–15.0)
MCH: 29.6 pg (ref 26.0–34.0)
MCHC: 34.5 g/dL (ref 30.0–36.0)
MCV: 85.6 fL (ref 80.0–100.0)
Platelets: 165 10*3/uL (ref 150–400)
RBC: 2.91 MIL/uL — ABNORMAL LOW (ref 3.87–5.11)
RDW: 16.8 % — ABNORMAL HIGH (ref 11.5–15.5)
WBC: 12.7 10*3/uL — ABNORMAL HIGH (ref 4.0–10.5)
nRBC: 1.6 % — ABNORMAL HIGH (ref 0.0–0.2)

## 2019-06-14 LAB — GLUCOSE, CAPILLARY
Glucose-Capillary: 61 mg/dL — ABNORMAL LOW (ref 70–99)
Glucose-Capillary: 69 mg/dL — ABNORMAL LOW (ref 70–99)
Glucose-Capillary: 88 mg/dL (ref 70–99)
Glucose-Capillary: 159 mg/dL — ABNORMAL HIGH (ref 70–99)
Glucose-Capillary: 213 mg/dL — ABNORMAL HIGH (ref 70–99)
Glucose-Capillary: 210 mg/dL — ABNORMAL HIGH (ref 70–99)
Glucose-Capillary: 121 mg/dL — ABNORMAL HIGH (ref 70–99)

## 2019-06-14 MED ORDER — OXYCODONE-ACETAMINOPHEN 5-325 MG PO TABS: 1.0000 | ORAL_TABLET | ORAL | Status: DC | PRN

## 2019-06-14 MED ORDER — HYDROMORPHONE 1 MG/ML IV SOLN
INTRAVENOUS | Status: DC
  Administered 2019-06-14: 2 mg via INTRAVENOUS
  Administered 2019-06-15: 0 mg via INTRAVENOUS
  Administered 2019-06-15: 3 mg via INTRAVENOUS
  Administered 2019-06-15 (×2): 0.5 mg via INTRAVENOUS

## 2019-06-14 MED ORDER — OXYCODONE HCL 5 MG PO TABS: 5.0000 mg | ORAL_TABLET | ORAL | Status: DC | PRN

## 2019-06-14 NOTE — Care Management Important Message (Signed)
Important Message  Patient Details IM Letter given to Servando Snare SW to present to the Patient Name: [name] MRN: [id number] Date of Birth: [birth year]   Medicare Important Message Given:  Yes     Kerin Salen 06/14/2019, 10:10 AM

## 2019-06-14 NOTE — Progress Notes (Addendum)
Subjective: [name], a 73 year old female with a medical history significant for sickle cell disease, type [place], type 2 diabetes mellitus, essential hypertension, chronic kidney disease stage IV, hypothyroidism, glaucoma, and CAD was admitted for sickle cell pain crisis.  Patient states that pain has improved some overnight.  Pain intensity is 5-6/10.  She states that she cannot manage at home at current pain level.  Patient has been able to get out of bed with minimal assistance.  Patient has a history of type 2 diabetes mellitus, she is on Lantus.  CBG 69 prior to breakfast.  Patient eating on arrival.  Patient denies blurry vision, dizziness, polyuria, polydipsia, or polyphagia.  She also denies headache, shortness of breath, nausea, vomiting, or diarrhea.  Patient is afebrile and maintaining oxygen saturation at 95% on RA.  Objective:  Vital signs in last 24 hours:  Vitals:   06/14/19 0059 06/14/19 0400 06/14/19 0419 06/14/19 0828  BP: (!) 157/60  (!) 146/67   Pulse: (!) 59  63   Resp: 20 15 16 16   Temp: 98.1 F (36.7 C)  98.9 F (37.2 C)   TempSrc:      SpO2: 95% 95% 98% 99%  Weight:      Height:        Intake/Output from previous day:   Intake/Output Summary (Last 24 hours) at 06/14/2019 0837 Last data filed at 06/14/2019 0400 Gross per 24 hour  Intake 593 ml  Output 4400 ml  Net -3807 ml    Physical Exam: General: Alert, awake, oriented x3, in no acute distress.  HEENT: [place]/AT PEERL, EOMI Neck: Trachea midline,  no masses, no thyromegal,y no JVD, no carotid bruit OROPHARYNX:  Moist, No exudate/ erythema/lesions.  Heart: Regular rate and rhythm, without murmurs, rubs, gallops, PMI non-displaced, no heaves or thrills on palpation.  Lungs: Clear to auscultation, no wheezing or rhonchi noted. No increased vocal fremitus resonant to percussion  Abdomen: Soft, nontender, nondistended, positive bowel sounds, no masses no hepatosplenomegaly noted..  Neuro: No focal  neurological deficits noted cranial nerves II through XII grossly intact. DTRs 2+ bilaterally upper and lower extremities. Strength 5 out of 5 in bilateral upper and lower extremities. Musculoskeletal: No warm swelling or erythema around joints, no spinal tenderness noted. Psychiatric: Patient alert and oriented x3, good insight and cognition, good recent to remote recall. Lymph node survey: No cervical axillary or inguinal lymphadenopathy noted.  Lab Results:  Basic Metabolic Panel:    Component Value Date/Time   NA 139 06/14/2019 0648   K PENDING 06/14/2019 0648   CL 108 06/14/2019 0648   CO2 21 (L) 06/14/2019 0648   BUN 23 06/14/2019 0648   CREATININE 1.31 (H) 06/14/2019 0648   GLUCOSE 71 06/14/2019 0648   CALCIUM 9.8 06/14/2019 0648   CBC:    Component Value Date/Time   WBC 12.7 (H) 06/14/2019 0648   HGB 8.6 (L) 06/14/2019 0648   HCT 24.9 (L) 06/14/2019 0648   PLT 165 06/14/2019 0648   MCV 85.6 06/14/2019 0648   NEUTROABS 8.5 (H) 06/12/2019 0604   LYMPHSABS 2.7 06/12/2019 0604   MONOABS 1.3 (H) 06/12/2019 0604   EOSABS 0.1 06/12/2019 0604   BASOSABS 0.0 06/12/2019 0604    Recent Results (from the past 240 hour(s))  SARS Coronavirus 2 (CEPHEID - Performed in [place] hospital lab), Hosp Order     Status: None   Collection Time: 06/11/19  1:53 PM   Specimen: Nasopharyngeal Swab  Result Value Ref Range Status   SARS  Coronavirus 2 NEGATIVE NEGATIVE Final    Comment: (NOTE) If result is NEGATIVE SARS-CoV-2 target nucleic acids are NOT DETECTED. The SARS-CoV-2 RNA is generally detectable in upper and lower  respiratory specimens during the acute phase of infection. The lowest  concentration of SARS-CoV-2 viral copies this assay can detect is 250  copies / mL. A negative result does not preclude SARS-CoV-2 infection  and should not be used as the sole basis for treatment or other  patient management decisions.  A negative result may occur with  improper specimen  collection / handling, submission of specimen other  than nasopharyngeal swab, presence of viral mutation(s) within the  areas targeted by this assay, and inadequate number of viral copies  (<250 copies / mL). A negative result must be combined with clinical  observations, patient history, and epidemiological information. If result is POSITIVE SARS-CoV-2 target nucleic acids are DETECTED. The SARS-CoV-2 RNA is generally detectable in upper and lower  respiratory specimens dur ing the acute phase of infection.  Positive  results are indicative of active infection with SARS-CoV-2.  Clinical  correlation with patient history and other diagnostic information is  necessary to determine patient infection status.  Positive results do  not rule out bacterial infection or co-infection with other viruses. If result is PRESUMPTIVE POSTIVE SARS-CoV-2 nucleic acids MAY BE PRESENT.   A presumptive positive result was obtained on the submitted specimen  and confirmed on repeat testing.  While 2019 novel coronavirus  (SARS-CoV-2) nucleic acids may be present in the submitted sample  additional confirmatory testing may be necessary for epidemiological  and / or clinical management purposes  to differentiate between  SARS-CoV-2 and other Sarbecovirus currently known to infect humans.  If clinically indicated additional testing with an alternate test  methodology [phone]) is advised. The SARS-CoV-2 RNA is generally  detectable in upper and lower respiratory sp ecimens during the acute  phase of infection. The expected result is Negative. Fact Sheet for Patients:  StrictlyIdeas.no Fact Sheet for Healthcare Providers: BankingDealers.co.za This test is not yet approved or cleared by the [place] FDA and has been authorized for detection and/or diagnosis of SARS-CoV-2 by FDA under an Emergency Use Authorization (EUA).  This EUA will remain in effect  (meaning this test can be used) for the duration of the COVID-19 declaration under Section 564(b)(1) of the Act, 21 U.S.C. section 360bbb-3(b)(1), unless the authorization is terminated or revoked sooner. Performed at [facility], [place] [address] [place], [place] [address]     Studies/Results: No results found.  Medications: Scheduled Meds: . amLODipine  10 mg Oral Daily  . aspirin EC  81 mg Oral Daily  . atorvastatin  40 mg Oral QHS  . calcium-vitamin D  1 tablet Oral Q breakfast  . cycloSPORINE  1 drop Both Eyes BID  . enoxaparin (LOVENOX) injection  30 mg Subcutaneous Q24H  . ferrous sulfate  325 mg Oral Q breakfast  . folic acid  1 mg Oral Daily  . glipiZIDE  5 mg Oral Q breakfast  . HYDROmorphone   Intravenous Q4H  . insulin aspart  0-9 Units Subcutaneous TID WC  . insulin glargine  18 Units Subcutaneous QHS  . levothyroxine  75 mcg Oral QAC breakfast  . losartan  100 mg Oral Daily  . metoprolol succinate  50 mg Oral Daily  . senna-docusate  1 tablet Oral BID  . timolol  1 drop Both Eyes BID   Continuous Infusions: . sodium chloride 75  mL/hr at 06/13/19 2205  . diphenhydrAMINE     PRN Meds:.diphenhydrAMINE **OR** diphenhydrAMINE, HYDROcodone-acetaminophen, naloxone **AND** sodium chloride flush, ondansetron (ZOFRAN) IV, polyethylene glycol  Assessment/Plan: Active Problems:   Sickle cell pain crisis (HCC)   Sickle cell disease with pain crisis: Continue IV fluids at 50 mL/h Weaning Dilaudid PCA, 0.3 mg, 8-minute lockout, and 1.25 mg/h IV Toradol contraindicated due to history of CKD Continue to monitor vital signs very closely Maintain oxygen saturation above 90%, 2 L supplemental oxygen as needed  Acute on chronic CKD stage IV: Serum creatinine improved to 1.3 from 1.67 on yesterday.  Continue gentle hydration Decrease fluids to 50 mL/h Continue to avoid all nephrotoxic medications.  Leukocytosis: Stable.  Patient continues to be  afebrile and no signs of infection or inflammation. Continue to monitor closely.  Sickle cell anemia: Hemoglobin stable, consistent with baseline.  No clinical indication for blood transfusion at this time.  Chronic pain syndrome: Increased home pain medication to Percocet 10-3 25 every 4 hours as needed for moderate to severe pain  Type 2 diabetes mellitus: CBG 69 prior to breakfast.  Increased to 80 after 15 g carbohydrate snack.  Patient advised to follow carbohydrate modified diet.  Consult to dietition.  Essential hypertension: Stable.  Continue home medications.  Glaucoma: Continue home medications    Code Status: Full Code Family Communication: N/A Disposition Plan: Not yet ready for discharge  [place], MSN, FNP-C Patient [place] [address] [place], [place] [address] [phone]  If 5PM-7AM, please contact night-coverage.  06/14/2019, 8:37 AM  LOS: 3 days

## 2019-06-14 NOTE — Progress Notes (Signed)
CRITICAL VALUE ALERT  Critical Value:  CBG 69  Date & Time Notied:  06/14/19  Provider Notified: Cammie Sickle, NP  Orders Received/Actions taken: YES

## 2019-06-14 NOTE — Progress Notes (Signed)
Inpatient Diabetes Program Recommendations  AACE/ADA: New Consensus Statement on Inpatient Glycemic Control (2015)  Target Ranges:  Prepandial:   less than 140 mg/dL      Peak postprandial:   less than 180 mg/dL (1-2 hours)      Critically ill patients:  140 - 180 mg/dL   Lab Results  Component Value Date   GLUCAP 88 06/14/2019   HGBA1C 4.4 (L) 06/12/2019    Review of Glycemic Control  Diabetes history: DM2 Outpatient Diabetes medications: Lantus 18 units QHS, glipizide 5 mg QAM Current orders for Inpatient glycemic control: Lantus 18 units QHS, glipizide 5 mg QAM, Novolog 0-9 units tidwc  HgbA1C - 4.4% - not accurate with sickle cell anemia Hypoglycemia this am. Reassess basal insulin dose  Inpatient Diabetes Program Recommendations:     Decrease Lantus to 14 units QHS D/C glipizide while inpatient to prevent hypoglycemia  Continue to follow.  Thank you. Lorenda Peck, RD, LDN, CDE Inpatient Diabetes Coordinator [phone]

## 2019-06-15 LAB — GLUCOSE, CAPILLARY
Glucose-Capillary: 78 mg/dL (ref 70–99)
Glucose-Capillary: 105 mg/dL — ABNORMAL HIGH (ref 70–99)
Glucose-Capillary: 157 mg/dL — ABNORMAL HIGH (ref 70–99)
Glucose-Capillary: 156 mg/dL — ABNORMAL HIGH (ref 70–99)

## 2019-06-15 LAB — CBC
HCT: 23.5 % — ABNORMAL LOW (ref 36.0–46.0)
Hemoglobin: 8.2 g/dL — ABNORMAL LOW (ref 12.0–15.0)
MCH: 29.8 pg (ref 26.0–34.0)
MCHC: 34.9 g/dL (ref 30.0–36.0)
MCV: 85.5 fL (ref 80.0–100.0)
Platelets: 172 10*3/uL (ref 150–400)
RBC: 2.75 MIL/uL — ABNORMAL LOW (ref 3.87–5.11)
RDW: 16.5 % — ABNORMAL HIGH (ref 11.5–15.5)
WBC: 12.1 10*3/uL — ABNORMAL HIGH (ref 4.0–10.5)
nRBC: 1.3 % — ABNORMAL HIGH (ref 0.0–0.2)

## 2019-06-15 LAB — BASIC METABOLIC PANEL
Anion gap: 7 (ref 5–15)
BUN: 23 mg/dL (ref 8–23)
CO2: 23 mmol/L (ref 22–32)
Calcium: 9.5 mg/dL (ref 8.9–10.3)
Chloride: 108 mmol/L (ref 98–111)
Creatinine, Ser: 1.27 mg/dL — ABNORMAL HIGH (ref 0.44–1.00)
GFR calc Af Amer: 50 mL/min — ABNORMAL LOW (ref >60)
GFR calc non Af Amer: 43 mL/min — ABNORMAL LOW (ref >60)
Glucose, Bld: 94 mg/dL (ref 70–99)
Potassium: 4.1 mmol/L (ref 3.5–5.1)
Sodium: 138 mmol/L (ref 135–145)

## 2019-06-15 MED ORDER — HYDROMORPHONE HCL 1 MG/ML IJ SOLN
1.0000 mg | Freq: Four times a day (QID) | INTRAMUSCULAR | Status: DC | PRN
  Administered 2019-06-15 – 2019-06-16 (×2): 1 mg via INTRAVENOUS
  Filled 2019-06-15 (×2): qty 1

## 2019-06-15 MED ORDER — ENOXAPARIN SODIUM 40 MG/0.4ML ~~LOC~~ SOLN
40.0000 mg | SUBCUTANEOUS | Status: DC
  Administered 2019-06-15: 40 mg via SUBCUTANEOUS
  Filled 2019-06-15: qty 0.4

## 2019-06-15 MED ORDER — OXYCODONE-ACETAMINOPHEN 5-325 MG PO TABS
1.0000 | ORAL_TABLET | ORAL | Status: DC
  Administered 2019-06-15: 1 via ORAL
  Filled 2019-06-15: qty 1

## 2019-06-15 MED ORDER — OXYCODONE HCL 5 MG PO TABS
5.0000 mg | ORAL_TABLET | ORAL | Status: DC
  Administered 2019-06-15: 5 mg via ORAL
  Filled 2019-06-15: qty 1

## 2019-06-15 MED ORDER — OXYCODONE HCL 5 MG PO TABS
10.0000 mg | ORAL_TABLET | ORAL | Status: DC
  Administered 2019-06-15 – 2019-06-16 (×5): 10 mg via ORAL
  Filled 2019-06-15 (×5): qty 2

## 2019-06-15 NOTE — Progress Notes (Signed)
Subjective: [name], a 73 year old female with a medical history significant for sickle cell disease, type [place], DMII, essential hypertension, chronic kidney disease stage IV, hypothyroidism, glaucoma, and CAD was admitted for sickle cell pain crisis.  Patient continues to complain of right flank pain.  Current pain intensity 5/10.  Patient is not maximizing PCA Dilaudid.  She also has not requested Percocet, which was increased on yesterday.  Patient has been able to ambulate in halls with assistance.  Oxygen saturation remained between 95 and 100% with ambulation.  Patient states that she cannot manage at home at current pain intensity.  Patient denies headache, dizziness, shortness of breath, chest pain, nausea, vomiting, or diarrhea.   Objective:  Vital signs in last 24 hours:  Vitals:   06/15/19 1000 06/15/19 1218 06/15/19 1345 06/15/19 1401  BP:   (!) 142/56   Pulse:   65 95  Resp:  16 19   Temp:   98.7 F (37.1 C)   TempSrc:   Oral   SpO2: 97% 98% 95% 99%  Weight:      Height:        Intake/Output from previous day:   Intake/Output Summary (Last 24 hours) at 06/15/2019 1620 Last data filed at 06/15/2019 1400 Gross per 24 hour  Intake 1011 ml  Output 500 ml  Net 511 ml    Physical Exam: General: Alert, awake, oriented x3, in no acute distress.  HEENT: [place]/AT PEERL, EOMI Neck: Trachea midline,  no masses, no thyromegal,y no JVD, no carotid bruit OROPHARYNX:  Moist, No exudate/ erythema/lesions.  Heart: Regular rate and rhythm, without murmurs, rubs, gallops, PMI non-displaced, no heaves or thrills on palpation.  Lungs: Clear to auscultation, no wheezing or rhonchi noted. No increased vocal fremitus resonant to percussion  Abdomen: Soft, nontender, nondistended, positive bowel sounds, no masses no hepatosplenomegaly noted..  Neuro: No focal neurological deficits noted cranial nerves II through XII grossly intact. DTRs 2+ bilaterally upper and lower extremities.  Strength 5 out of 5 in bilateral upper and lower extremities. Musculoskeletal: No warm swelling or erythema around joints, no spinal tenderness noted. Psychiatric: Patient alert and oriented x3, good insight and cognition, good recent to remote recall. Lymph node survey: No cervical axillary or inguinal lymphadenopathy noted.  Lab Results:  Basic Metabolic Panel:    Component Value Date/Time   NA 138 06/15/2019 0621   K 4.1 06/15/2019 0621   CL 108 06/15/2019 0621   CO2 23 06/15/2019 0621   BUN 23 06/15/2019 0621   CREATININE 1.27 (H) 06/15/2019 0621   GLUCOSE 94 06/15/2019 0621   CALCIUM 9.5 06/15/2019 0621   CBC:    Component Value Date/Time   WBC 12.1 (H) 06/15/2019 0621   HGB 8.2 (L) 06/15/2019 0621   HCT 23.5 (L) 06/15/2019 0621   PLT 172 06/15/2019 0621   MCV 85.5 06/15/2019 0621   NEUTROABS 8.5 (H) 06/12/2019 0604   LYMPHSABS 2.7 06/12/2019 0604   MONOABS 1.3 (H) 06/12/2019 0604   EOSABS 0.1 06/12/2019 0604   BASOSABS 0.0 06/12/2019 0604    Recent Results (from the past 240 hour(s))  SARS Coronavirus 2 (CEPHEID - Performed in [place] hospital lab), Hosp Order     Status: None   Collection Time: 06/11/19  1:53 PM   Specimen: Nasopharyngeal Swab  Result Value Ref Range Status   SARS Coronavirus 2 NEGATIVE NEGATIVE Final    Comment: (NOTE) If result is NEGATIVE SARS-CoV-2 target nucleic acids are NOT DETECTED. The SARS-CoV-2 RNA is generally  detectable in upper and lower  respiratory specimens during the acute phase of infection. The lowest  concentration of SARS-CoV-2 viral copies this assay can detect is 250  copies / mL. A negative result does not preclude SARS-CoV-2 infection  and should not be used as the sole basis for treatment or other  patient management decisions.  A negative result may occur with  improper specimen collection / handling, submission of specimen other  than nasopharyngeal swab, presence of viral mutation(s) within the  areas  targeted by this assay, and inadequate number of viral copies  (<250 copies / mL). A negative result must be combined with clinical  observations, patient history, and epidemiological information. If result is POSITIVE SARS-CoV-2 target nucleic acids are DETECTED. The SARS-CoV-2 RNA is generally detectable in upper and lower  respiratory specimens dur ing the acute phase of infection.  Positive  results are indicative of active infection with SARS-CoV-2.  Clinical  correlation with patient history and other diagnostic information is  necessary to determine patient infection status.  Positive results do  not rule out bacterial infection or co-infection with other viruses. If result is PRESUMPTIVE POSTIVE SARS-CoV-2 nucleic acids MAY BE PRESENT.   A presumptive positive result was obtained on the submitted specimen  and confirmed on repeat testing.  While 2019 novel coronavirus  (SARS-CoV-2) nucleic acids may be present in the submitted sample  additional confirmatory testing may be necessary for epidemiological  and / or clinical management purposes  to differentiate between  SARS-CoV-2 and other Sarbecovirus currently known to infect humans.  If clinically indicated additional testing with an alternate test  methodology [phone]) is advised. The SARS-CoV-2 RNA is generally  detectable in upper and lower respiratory sp ecimens during the acute  phase of infection. The expected result is Negative. Fact Sheet for Patients:  StrictlyIdeas.no Fact Sheet for Healthcare Providers: BankingDealers.co.za This test is not yet approved or cleared by the [place] FDA and has been authorized for detection and/or diagnosis of SARS-CoV-2 by FDA under an Emergency Use Authorization (EUA).  This EUA will remain in effect (meaning this test can be used) for the duration of the COVID-19 declaration under Section 564(b)(1) of the Act, 21 U.S.C. section  360bbb-3(b)(1), unless the authorization is terminated or revoked sooner. Performed at [facility], [place] [address], [place], [place] [address]     Studies/Results: No results found.  Medications: Scheduled Meds: . amLODipine  10 mg Oral Daily  . aspirin EC  81 mg Oral Daily  . atorvastatin  40 mg Oral QHS  . calcium-vitamin D  1 tablet Oral Q breakfast  . cycloSPORINE  1 drop Both Eyes BID  . enoxaparin (LOVENOX) injection  40 mg Subcutaneous Q24H  . ferrous sulfate  325 mg Oral Q breakfast  . folic acid  1 mg Oral Daily  . glipiZIDE  5 mg Oral Q breakfast  . insulin aspart  0-9 Units Subcutaneous TID WC  . insulin glargine  18 Units Subcutaneous QHS  . levothyroxine  75 mcg Oral QAC breakfast  . losartan  100 mg Oral Daily  . metoprolol succinate  50 mg Oral Daily  . oxyCODONE  10 mg Oral Q4H while awake  . senna-docusate  1 tablet Oral BID  . timolol  1 drop Both Eyes BID   Continuous Infusions: . sodium chloride 50 mL/hr at 06/15/19 0700   PRN Meds:.HYDROmorphone (DILAUDID) injection, polyethylene glycol   Assessment/Plan: Active Problems:   Sickle cell pain crisis (  [place])   Sickle cell disease with pain crisis: Continue IV fluids at 50 mL/h Discontinue Dilaudid PCA Oxycodone 10 mg every 4 hours while awake Continue to monitor vital signs closely Maintain oxygen saturation above 90%, 2 L supplemental oxygen as needed  Acute on chronic CKD stage IV: Serum creatinine improved to 1.2 from 1.3 on yesterday.  Continue gentle hydration. Continue to avoid all nephrotoxic medications.  Leukocytosis: Stable.  Patient afebrile no signs of infection or inflammation.  Sickle cell anemia: Hemoglobin stable at 8.2, consistent with patient's baseline.  No clinical indication for blood transfusion at this time.  Continue to monitor.  Chronic pain syndrome: Increase home pain medication.  Oxycodone 10 mg every 4 hours while awake  Type 2 diabetes  mellitus: Stable.  Continue SSI and Lantus at bedtime. Also, carbohydrate modified diet.  Essential hypertension: Stable.  Continue home medications.  Glaucoma: Continue home medications   Code Status: Full Code Family Communication: N/A Disposition Plan: Not yet ready for discharge  [place], MSN, FNP-C Patient [facility] [address] [place], [place] [address] [phone]  If 5PM-7AM, please contact night-coverage.  06/15/2019, 4:20 PM  LOS: 4 days

## 2019-06-15 NOTE — Progress Notes (Signed)
  RD consulted for nutrition education regarding diabetes.   **RD working remotelyReynolds American Value Date   HGBA1C 4.4 (L) 06/12/2019   Spoke with patient on the phone. RD provided "Carbohydrate Counting for People with Diabetes" handout from the Academy of Nutrition and Dietetics in discharge instructions. Discussed different food groups and their effects on blood sugar, emphasizing carbohydrate-containing foods. Provided list of carbohydrates and recommended serving sizes of common foods.  Discussed importance of controlled and consistent carbohydrate intake throughout the day. Provided examples of ways to balance meals/snacks and encouraged intake of high-fiber, whole grain complex carbohydrates. Teach back method used.  Expect poor-fair compliance. Pt not very interactive during conversation. Reports she has no questions about diet.  Body mass index is 27.84 kg/m. Pt meets criteria for  based on current BMI.  Current diet order is CHO modified, patient is consuming approximately 45-60% of meals at this time. Labs and medications reviewed. No further nutrition interventions warranted at this time.  If additional nutrition issues arise, please re-consult RD.  Clayton Bibles, MS, RD, [place] Dietitian Pager: [phone] After Hours Pager: [phone]

## 2019-06-15 NOTE — Progress Notes (Signed)
SATURATION QUALIFICATIONS: (This note is used to comply with regulatory documentation for home oxygen)  Patient Saturations on Room Air at Rest = 98%  Patient Saturations on Room Air while Ambulating = 93%   Pt does not require home oxygen. Ambulated well with assistance, maintained oxygen saturations above 88%.

## 2019-06-16 LAB — BASIC METABOLIC PANEL
Anion gap: 5 (ref 5–15)
BUN: 26 mg/dL — ABNORMAL HIGH (ref 8–23)
CO2: 25 mmol/L (ref 22–32)
Calcium: 9.6 mg/dL (ref 8.9–10.3)
Chloride: 106 mmol/L (ref 98–111)
Creatinine, Ser: 1.57 mg/dL — ABNORMAL HIGH (ref 0.44–1.00)
GFR calc Af Amer: 38 mL/min — ABNORMAL LOW (ref >60)
GFR calc non Af Amer: 33 mL/min — ABNORMAL LOW (ref >60)
Glucose, Bld: 101 mg/dL — ABNORMAL HIGH (ref 70–99)
Potassium: 4.4 mmol/L (ref 3.5–5.1)
Sodium: 136 mmol/L (ref 135–145)

## 2019-06-16 LAB — GLUCOSE, CAPILLARY
Glucose-Capillary: 102 mg/dL — ABNORMAL HIGH (ref 70–99)
Glucose-Capillary: 97 mg/dL (ref 70–99)

## 2019-06-16 MED ORDER — OXYCODONE HCL 10 MG PO TABS: 10.0000 mg | ORAL_TABLET | Freq: Four times a day (QID) | ORAL | 0 refills | Status: DC | PRN

## 2019-06-16 NOTE — Discharge Instructions (Signed)
All of your labs were consistent with your baseline at discharge.  I recommend that your PCP will repeat your creatinine level in a.m. CBC within 1 week.  Due to your increased pain crisis, I have increased your home opiate medications.  Oxycodone 10 mg every 6 hours as needed for severe pain.  Continue to utilize Tylenol 500 mg every 4 hours for mild to moderate pain.  I have left several messages with Dr. Oralia Rud office for a call back concerning increasing your pain medications.  You will need to schedule first available appointment for medication management.    Sickle Cell Anemia, Adult  Sickle cell anemia is a condition in which red blood cells have an abnormal sickle shape. Red blood cells carry oxygen through the body. Sickle-shaped red blood cells do not live as long as normal red blood cells. They also clump together and block blood from flowing through the blood vessels. This condition prevents the body from getting enough oxygen. Sickle cell anemia causes organ damage and pain. It also increases the risk of infection. What are the causes? This condition is caused by a gene that is passed from parent to child (inherited). Receiving two copies of the gene causes the disease. Receiving one copy causes the "trait," which means that symptoms are milder or not present. What increases the risk? This condition is more likely to develop if your ancestors were from [place], the [place], [place] or [place], the [place], [place], or the [place]. What are the signs or symptoms? Symptoms of this condition include:  Episodes of pain (crises), especially in the hands and feet, joints, back, chest, or abdomen. The pain can be triggered by: ? An illness, especially if there is dehydration. ? Doing an activity with great effort (overexertion). ? Exposure to extreme temperature changes. ? High altitude.  Fatigue.  Shortness of breath or difficulty  breathing.  Dizziness.  Pale skin or yellowed skin (jaundice).  Frequent bacterial infections.  Pain and swelling in the hands and feet (hand-food syndrome).  Prolonged, painful erection of the penis (priapism).  Acute chest syndrome. Symptoms of this include: ? Chest pain. ? Fever. ? Cough. ? Fast breathing.  Stroke.  Decreased activity.  Loss of appetite.  Change in behavior.  Headaches.  Seizures.  Vision changes.  Skin ulcers.  Heart disease.  High blood pressure.  Gallstones.  Liver and kidney problems. How is this diagnosed? This condition is diagnosed with blood tests that check for the gene that causes this condition. How is this treated? There is no cure for most cases of this condition. Treatment focuses on managing your symptoms and preventing complications of the disease. Your health care provider will work with you to identify the best treatment options for you based on an assessment of your condition. Treatment may include:  Medicines, including: ? Pain medicines. ? Antibiotic medicines for infection. ? Medicines to increase the production of a protein in red blood cells that helps carry oxygen in the body (hemoglobin).  Fluids to treat pain and swelling.  Oxygen to treat acute chest syndrome.  Blood transfusions to treat symptoms such as fatigue, stroke, and acute chest syndrome.  Massage and physical therapy for pain.  Regular tests to monitor your condition, such as blood tests, X-rays, CT scans, MRI scans, ultrasounds, and lung function tests. These should be done every 3-12 months, depending on your age.  Hematopoietic stem cell transplant. This is a procedure to replace abnormal stem cells with healthy stem cells  from a donor's bone marrow. Stem cells are cells that can develop into blood cells, and bone marrow is the spongy tissue inside the bones. Follow these instructions at home: Medicines  Take over-the-counter and prescription  medicines only as told by your health care provider.  If you were prescribed an antibiotic medicine, take it as told by your health care provider. Do not stop taking the antibiotic even if you start to feel better.  If you develop a fever, do not take medicines to reduce the fever right away. This could cover up another problem. Notify your health care provider. Managing pain, stiffness, and swelling  Try these methods to help ease your pain: ? Using a heating pad. ? Taking a warm bath. ? Distracting yourself, such as by watching TV. Eating and drinking  Drink enough fluid to keep your urine clear or pale yellow. Drink more in hot weather and during exercise.  Limit or avoid drinking alcohol.  Eat a balanced and nutritious diet. Eat plenty of fruits, vegetables, whole grains, and lean protein.  Take vitamins and supplements as directed by your health care provider. Traveling  When traveling, keep these with you: ? Your medical information. ? The names of your health care providers. ? Your medicines.  If you have to travel by air, ask about precautions you should take. Activity  Get plenty of rest.  Avoid activities that will lower your oxygen levels, such as exercising vigorously. General instructions  Do not use any products that contain nicotine or tobacco, such as cigarettes and e-cigarettes. They lower blood oxygen levels. If you need help quitting, ask your health care provider.  Consider wearing a medical alert bracelet.  Avoid high altitudes.  Avoid extreme temperatures and extreme temperature changes.  Keep all follow-up visits as told by your health care provider. This is important. Contact a health care provider if:  You develop joint pain.  Your feet or hands swell or have pain.  You have fatigue. Get help right away if:  You have symptoms of infection. These include: ? Fever. ? Chills. ? Extreme tiredness. ? Irritability. ? Poor  eating. ? Vomiting.  You feel dizzy or faint.  You have new abdominal pain, especially on the left side near the stomach area.  You develop priapism.  You have numbness in your arms or legs or have trouble moving them.  You have trouble talking.  You develop pain that cannot be controlled with medicine.  You become short of breath.  You have rapid breathing.  You have a persistent cough.  You have pain in your chest.  You develop a severe headache or stiff neck.  You feel bloated without eating or after eating a small amount of food.  Your skin is pale.  You suddenly lose vision. Summary  Sickle cell anemia is a condition in which red blood cells have an abnormal sickle shape. This disease can cause organ damage and chronic pain, and it can raise your risk of infection.  Sickle cell anemia is a genetic disorder.  Treatment focuses on managing your symptoms and preventing complications of the disease.  Get medical help right away if you have any signs of infection, such as a fever. This information is not intended to replace advice given to you by your health care provider. Make sure you discuss any questions you have with your health care provider. Document Released: 02/25/2006 Document Revised: 03/11/2019 Document Reviewed: 12/23/2016 Elsevier Patient Education  2020 [employer].  Carbohydrate Counting for People with Diabetes Why Is Carbohydrate Counting Important? Counting carbohydrate servings may help you control your blood glucose level so that you feel better. The balance between the carbohydrates you eat and insulin determines what your blood glucose level will be after eating. Carbohydrate counting can also help you plan your meals.  Which Foods Have Carbohydrates? Foods with carbohydrates include: Breads, crackers, and cereals Pasta, rice, and grains Starchy vegetables, such as potatoes, corn, and peas Beans and legumes Milk, soy milk, and  yogurt Fruits and fruit juices Sweets, such as cakes, cookies, ice cream, jam, and jelly  Carbohydrate Servings In diabetes meal planning, 1 serving of a food with carbohydrate has about 15 grams of carbohydrate: Check serving sizes with measuring cups and spoons or a food scale. Read the Nutrition Facts on food labels to find out how many grams of carbohydrate are in foods you eat. The food lists in this handout show portions that have about 15 grams of carbohydrate.  Meal Planning Tips An Eating Plan tells you how many carbohydrate servings to eat at your meals and snacks. For many adults, eating 3 to 5 servings of carbohydrate foods at each meal and 1 or 2 carbohydrate servings for each snack works well. In a healthy daily Eating Plan, most carbohydrates come from: At least 6 servings of fruits and nonstarchy vegetables At least 6 servings of grains, beans, and starchy vegetables, with at least 3 servings from whole grains At least 2 servings of milk or milk products Check your blood glucose level regularly. It can tell you if you need to adjust when you eat carbohydrates. Eating foods that have fiber, such as whole grains, and having very few salty foods is good for your health. Eat 4 to 6 ounces of meat or other protein foods (such as soybean burgers) each day.  Choose low-fat sources of protein, such as lean beef, lean pork, chicken, fish, low-fat cheese, or vegetarian foods such as soy. Eat some healthy fats, such as olive oil, canola oil, and nuts. Eat very little saturated fats. These unhealthy fats are found in butter, cream, and high-fat meats, such as bacon and sausage. Eat very little or no trans fats. These unhealthy fats are found in all foods that list partially hydrogenated oil as an Ingredient.  Label Reading Tips The Nutrition Facts panel on a label lists the grams of total carbohydrate in 1 standard serving.  The labels standard serving may be larger or smaller  than 1 carbohydrate serving. To figure out how many carbohydrate servings are in the food: First, look at the labels standard serving size. Check the grams of total carbohydrate. This is the amount of carbohydrate in 1 standard serving. Divide the grams of total carbohydrate by 15. This number equals the number of carbohydrate servings in 1 standard serving.  Remember: 1 carbohydrate serving is 15 grams of carbohydrate. Note: You may ignore the grams of sugars on the Nutrition Facts panel because they are included in the grams of total carbohydrate.  Foods Recommended 1 serving = about 15 grams of carbohydrate  Starches 1 slice bread (1 ounce) 1 tortilla (6-inch size)  large bagel (1 ounce) 2 taco shells (5-inch size)  hamburger or hot dog bun ( ounce)  cup ready-to-eat unsweetened cereal  cup cooked cereal 1 cup broth-based soup 4 to 6 small crackers 1/3 cup pasta or rice (cooked)  cup beans, peas, corn, sweet potatoes, winter squash, or mashed or boiled potatoes (cooked)  large  baked potato (3 ounces)  ounce pretzels, potato chips, or tortilla chips 3 cups popcorn (popped)  Fruit 1 small fresh fruit ( to 1 cup)  cup canned or frozen fruit 2 tablespoons dried fruit (blueberries, cherries, cranberries, mixed fruit, raisins) 17 small grapes (3 ounces) 1 cup melon or berries  cup unsweetened fruit juice  Milk 1 cup fat-free or reduced-fat milk 1 cup soy milk 2/3 cup (6 ounces) nonfat yogurt sweetened with sugar-free sweetener  Sweets and Desserts 2-inch square cake (unfrosted) 2 small cookies (2/3 ounce)  cup ice cream or frozen yogurt  cup sherbet or sorbet 1 tablespoon syrup, jam, jelly, table sugar, or honey 2 tablespoons light syrup  Other Foods Count 1 cup raw vegetables or  cup cooked nonstarchy vegetables as zero (0) carbohydrate servings or free foods. If you eat 3 or more servings at one meal, count them as 1 carbohydrate serving. Foods  that have less than 20 calories in each serving also may be counted as zero carbohydrate servings or free foods. Count 1 cup of casserole or other mixed foods as 2 carbohydrate servings.  Carbohydrate Counting for People with Diabetes Sample 1-Day Menu Breakfast 1 extra-small banana (1 carbohydrate serving) 3/4 cup corn flakes (1 carbohydrate serving) 1 cup low-fat or fat-free milk (1 carbohydrate serving) 1 slice whole wheat bread (1 carbohydrate serving) 1 teaspoon margarine Lunch 2 ounces [place] slices 2 slices whole wheat bread (2 carbohydrate servings) 2 lettuce leaves 4 celery sticks 4 carrot sticks 1 medium apple (1 carbohydrate serving) 1 cup low-fat or fat-free milk (1 carbohydrate serving) Afternoon Snack 2 tablespoons raisins (1 carbohydrate serving) 3/4 ounce unsalted mini pretzels (1 carbohydrate serving) Evening Meal 3 ounces lean roast beef 1/2 large baked potato (2 carbohydrate servings) 1 tablespoon reduced-fat sour cream 1/2 cup green beans 1 cup vegetable salad 1 tablespoon light salad dressing 1 whole wheat dinner roll (1 carbohydrate serving) 1 teaspoon margarine 1 cup melon balls (1 carbohydrate serving) Evening Snack 6 ounces low-fat sugar-free fruit yogurt (1 carbohydrate serving) 2 tablespoons unsalted nuts  Diabetes Label Reading Tips Check the Nutrition Facts on food labels for nutrient information for the food. The Nutrition Facts information is based on a standard serving size.  However, that serving size may not be the same as the serving size used in carbohydrate counting. Always start by checking the serving size on the label. Is this the serving size you will be eating?  How many servings are in the package? Next, look at the total carbohydrate. It is measured in grams (g).  To find the number of carbohydrate servings in 1 standard serving of a food, divide the total grams of carbohydrate by 15.  One (1) carbohydrate serving is the amount  of food with 15 g carbohydrate. You dont need to count grams of sugars. They are included in the total carbohydrate. The label shows how many calories are in the standard serving. It also lists the amount of fat, cholesterol, sodium, protein, and some vitamins and minerals.  Look below the line listing total fat to find out how much of that fat is saturated fat or trans fat.  Choose foods that are low in these kinds of fats because they are not healthy for your heart.  In the foods that are healthiest for your heart, grams of saturated fat and trans fat are less than one-third of the total fat grams. If foods are very low in calories (less than 20 calories per serving) or carbohydrates (5  g carbohydrate or less per serving), you may not need to count them when you count carbohydrates. Ask your registered dietitian nutritionist or diabetes educator about these free foods

## 2019-06-16 NOTE — Progress Notes (Signed)
RN called Mr. Dinia, Joynt at [phone] related to follow up appointment for his wife with PCP but  no one answered; left a message with phone number.

## 2019-06-16 NOTE — Discharge Summary (Signed)
Physician Discharge Summary  [name] ZOX:[id number] DOB: 1946-01-19 DOA: 06/11/2019  PCP: Vincente Liberty, MD  Admit date: 06/11/2019  Discharge date: 06/16/2019  Discharge Diagnoses:  Active Problems:   Sickle cell pain crisis [facility])   Discharge Condition: Stable  Disposition:  Pt is discharged home in good condition and is to follow up with Vincente Liberty, MD this week to have labs evaluated. [name] is instructed to increase activity slowly and balance with rest for the next few days, and use prescribed medication to complete treatment of pain  Diet: Regular Wt Readings from Last 3 Encounters:  06/13/19 71.3 kg  06/07/19 68 kg  05/29/18 68.5 kg    History of present illness:  [name] alive, a 73 year old female with a medical history significant for sickle cell disease, type [place], type 2 diabetes mellitus on Lantus, essential hypertension, chronic CKD stage IV not followed by nephrologist, hypothyroidism, glaucoma, and coronary artery disease presented to ER for the third time in 1 week with major complaints of pain that is been moving around from low back, limbs, to her shoulders.  This pain is consistent with her typical sickle cell crisis.  She states that she is unable to shake it off as she usually does.  Patient rates pain at 10/10 on arrival, by the time of admission pain intensity is 8/10.  Patient characterizes pain as throbbing, achy, and constant and is giving her difficulties in finding a position of relief.  She denies fever, cough, chest pain, shortness of breath, nausea, vomiting, or diarrhea.  No urinary symptoms, no changes in bowel habit.  Although patient claims she has nausea since receiving doses of Dilaudid in the ER.  ER course: Patient has mildly elevated systolic blood pressure, otherwise vital signs are within normal limits.  Hemoglobin 9.6, consistent with her baseline.  Serum creatinine of 1.76 with a GFR of 33 slightly above her baseline  of 1.6 and GFR around 40. Chest x-ray over the past several days shows no acute cardiopulmonary process.  Today her COVID-19 screening was negative.  Due to patient's age, and frequent admissions, and ED utilization now presenting with uncontrolled pain and worsening kidney functioning, patient admitted to [facility] for rehydration and pain control.  Hospital Course:  Sickle cell anemia with pain crisis: Patient was admitted for sickle cell pain crisis.  Patient had uncontrolled pain prior to admission.  She was treated with IV Dilaudid via PCA and other adjunct therapies per sickle cell management protocol and transition to oxycodone 10 mg every 4 hours as needed for moderate to severe pain.  Pain intensity prior to discharge 4/10.  Oxycodone 10 mg #20 was sent to patient's pharmacy.  Notify patient's PCP to discuss changes in pain medication regimen, is currently out of town and will be back in the office next week.  Patient asked to make a first available follow-up appointment for post hospital and medication management.  Acute on chronic CKD stage IV: Serum creatinine improved throughout admission.  Patient's baseline is around 1.6 with a GFR 40.  With gentle hydration creatinine improved to 1.5 prior to discharge.  Patient advised to avoid all nephrotoxic medications.  Also patient wants referral to a nephrologist she is currently not under the care of nephrologist for CKD.  History of type 2 diabetes mellitus: Patient was continued on Lantus without interruption throughout admission.  Also sensitive scale insulin for tighter control during hospitalization.  Patient experienced one episode of hypoglycemia, which was resolved after 15 g  carbohydrate snack.  Discussed the importance of 4-5 small meals throughout the day.  Also, patient can supplement with Glucerna.  She expressed understanding. She will also resume glipizide, which was held during admission.  Patient was ambulating with some  assistance prior to discharge.  She is alert and oriented.  Patient is afebrile and maintaining oxygen saturation at 99% on RA.  Pulse oximetry remained greater than 90 while ambulating.  Patient's pain intensity decreased to 4/10, primarily to right flank.  Patient advised to follow-up with Dr. Vincente Liberty adverse availability for post hospital follow-up and medication management.  Patient given written instructions concerning medication.  Patient was discharged home today in a hemodynamically stable condition.   Discharge Exam: Vitals:   06/16/19 1321 06/16/19 1500  BP: (!) 153/55   Pulse: 71   Resp: 16   Temp: 98.3 F (36.8 C)   SpO2: (!) 88% 92%   Vitals:   06/16/19 0024 06/16/19 0437 06/16/19 1321 06/16/19 1500  BP: (!) 148/49 (!) 143/55 (!) 153/55   Pulse: 62 67 71   Resp: 16 15 16    Temp: 99 F (37.2 C) 99.5 F (37.5 C) 98.3 F (36.8 C)   TempSrc: Axillary Oral Oral   SpO2: 93% 94% (!) 88% 92%  Weight:      Height:        General appearance : Awake, alert, not in any distress. Speech Clear. Not toxic looking HEENT: Atraumatic and Normocephalic, pupils equally reactive to light and accomodation Neck: Supple, no JVD. No cervical lymphadenopathy.  Chest: Good air entry bilaterally, no added sounds  CVS: S1 S2 regular, no murmurs.  Abdomen: Bowel sounds present, Non tender and not distended with no gaurding, rigidity or rebound. Extremities: B/L Lower Ext shows no edema, both legs are warm to touch Neurology: Awake alert, and oriented X 3, CN II-XII intact, Non focal Skin: No Rash  Discharge Instructions  Discharge Instructions    Discharge patient   Complete by: As directed    Discharge disposition: 01-Home or Self Care   Discharge patient date: 06/16/2019     Allergies as of 06/16/2019      Reactions   Bee Venom Swelling   Nsaids Other (See Comments)   Dyspepsia. Advised by Primary Provider to avoid because of Kidneys      Medication List    TAKE  these medications   acetaminophen 500 MG tablet Commonly known as: TYLENOL Take 1,000 mg by mouth every 6 (six) hours as needed for mild pain or moderate pain.   amLODipine 10 MG tablet Commonly known as: NORVASC Take 10 mg by mouth daily. Notes to patient: 06/17/2019   aspirin EC 81 MG tablet Take 81 mg by mouth daily. Notes to patient: 06/17/2019   atorvastatin 40 MG tablet Commonly known as: LIPITOR Take 40 mg by mouth at bedtime. Notes to patient: 06/16/2019   Calcium Carb-Cholecalciferol 600-800 MG-UNIT Tabs Take 1 tablet by mouth daily. Notes to patient: 06/17/2019   cycloSPORINE 0.05 % ophthalmic emulsion Commonly known as: RESTASIS Place 1 drop into both eyes 2 (two) times daily. Notes to patient: 06/16/2019   ferrous sulfate 325 (65 FE) MG tablet Take 325 mg by mouth daily with breakfast. Notes to patient: 85/46/2703   folic acid 1 MG tablet Commonly known as: FOLVITE Take 1 mg by mouth daily. Notes to patient: 06/17/2019   glipiZIDE 5 MG 24 hr tablet Commonly known as: GLUCOTROL XL Take 5 mg by mouth daily with breakfast. Notes to patient:  06/17/2019   insulin glargine 100 UNIT/ML injection Commonly known as: LANTUS Inject 0.18 mLs (18 Units total) into the skin at bedtime. Notes to patient: 06/16/2019   levothyroxine 75 MCG tablet Commonly known as: SYNTHROID Take 75 mcg by mouth daily before breakfast. Notes to patient: 06/17/2019   losartan 100 MG tablet Commonly known as: COZAAR Take 100 mg by mouth daily. Notes to patient: 06/17/2019   Lumigan 0.01 % Soln Generic drug: bimatoprost Place 1 drop into both eyes at bedtime. Separate by at least 10 minutes from other intraocular pressure reducing ophthalmic drugs Notes to patient: 06/16/2019   metoprolol succinate 50 MG 24 hr tablet Commonly known as: TOPROL-XL Take 50 mg by mouth daily. Take with or immediately following a meal. Notes to patient: 06/17/2019   Oxycodone HCl 10 MG Tabs Take  1 tablet (10 mg total) by mouth every 6 (six) hours as needed for severe pain.   spironolactone 25 MG tablet Commonly known as: ALDACTONE Take 25 mg by mouth daily. Notes to patient: 06/16/2019   timolol 0.5 % ophthalmic solution Commonly known as: TIMOPTIC Place 1 drop into both eyes 2 (two) times daily.   Travoprost (BAK Free) 0.004 % Soln ophthalmic solution Commonly known as: TRAVATAN Place 1 drop into both eyes at bedtime. Notes to patient: 06/16/2019       The results of significant diagnostics from this hospitalization (including imaging, microbiology, ancillary and laboratory) are listed below for reference.    Significant Diagnostic Studies: Dg Chest 2 View  Result Date: 06/07/2019 CLINICAL DATA:  Pt c/o central cp x 3 days, w/ upper back pain as well, pt hx of sickle cell anemia. EXAM: CHEST - 2 VIEW COMPARISON:  05/29/2018 FINDINGS: Cardiac silhouette is normal in size. No mediastinal or hilar masses or evidence of adenopathy. Clear lungs.  No pleural effusion or pneumothorax. No acute skeletal abnormality. IMPRESSION: No active cardiopulmonary disease. Electronically Signed   By: Lajean Manes M.D.   On: 06/07/2019 14:34   Dg Thoracic Spine 2 View  Result Date: 06/07/2019 CLINICAL DATA:  Pt c/o central cp x 3 days, w/ upper back pain as well, pt hx of sickle cell anemia. EXAM: THORACIC SPINE 2 VIEWS COMPARISON:  Chest radiograph, 08/31/2017 FINDINGS: No fracture, bone lesion or spondylolisthesis. No significant degenerative changes. Soft tissues are unremarkable. IMPRESSION: Negative. Electronically Signed   By: Lajean Manes M.D.   On: 06/07/2019 14:35    Microbiology: Recent Results (from the past 240 hour(s))  SARS Coronavirus 2 (CEPHEID - Performed in [facility] [place] hospital lab), Hosp Order     Status: None   Collection Time: 06/11/19  1:53 PM   Specimen: Nasopharyngeal Swab  Result Value Ref Range Status   SARS Coronavirus 2 NEGATIVE NEGATIVE Final    Comment:  (NOTE) If result is NEGATIVE SARS-CoV-2 target nucleic acids are NOT DETECTED. The SARS-CoV-2 RNA is generally detectable in upper and lower  respiratory specimens during the acute phase of infection. The lowest  concentration of SARS-CoV-2 viral copies this assay can detect is 250  copies / mL. A negative result does not preclude SARS-CoV-2 infection  and should not be used as the sole basis for treatment or other  patient management decisions.  A negative result may occur with  improper specimen collection / handling, submission of specimen other  than nasopharyngeal swab, presence of viral mutation(s) within the  areas targeted by this assay, and inadequate number of viral copies  (<250 copies / mL). A negative result  must be combined with clinical  observations, patient history, and epidemiological information. If result is POSITIVE SARS-CoV-2 target nucleic acids are DETECTED. The SARS-CoV-2 RNA is generally detectable in upper and lower  respiratory specimens dur ing the acute phase of infection.  Positive  results are indicative of active infection with SARS-CoV-2.  Clinical  correlation with patient history and other diagnostic information is  necessary to determine patient infection status.  Positive results do  not rule out bacterial infection or co-infection with other viruses. If result is PRESUMPTIVE POSTIVE SARS-CoV-2 nucleic acids MAY BE PRESENT.   A presumptive positive result was obtained on the submitted specimen  and confirmed on repeat testing.  While 2019 novel coronavirus  (SARS-CoV-2) nucleic acids may be present in the submitted sample  additional confirmatory testing may be necessary for epidemiological  and / or clinical management purposes  to differentiate between  SARS-CoV-2 and other Sarbecovirus currently known to infect humans.  If clinically indicated additional testing with an alternate test  methodology [phone]) is advised. The SARS-CoV-2 RNA is  generally  detectable in upper and lower respiratory sp ecimens during the acute  phase of infection. The expected result is Negative. Fact Sheet for Patients:  StrictlyIdeas.no Fact Sheet for Healthcare Providers: BankingDealers.co.za This test is not yet approved or cleared by the [place] FDA and has been authorized for detection and/or diagnosis of SARS-CoV-2 by FDA under an Emergency Use Authorization (EUA).  This EUA will remain in effect (meaning this test can be used) for the duration of the COVID-19 declaration under Section 564(b)(1) of the Act, 21 U.S.C. section 360bbb-3(b)(1), unless the authorization is terminated or revoked sooner. Performed at [facility], [place] [address] [place], [place] [address]      Labs: Basic Metabolic Panel: Recent Labs  Lab 06/12/19 0604 06/13/19 0915 06/14/19 0648 06/15/19 0621 06/16/19 0921  NA 139 139 139 138 136  K 4.2 5.1 4.1 4.1 4.4  CL 108 110 108 108 106  CO2 22 22 21* 23 25  GLUCOSE 164* 157* 71 94 101*  BUN 32* 28* 23 23 26*  CREATININE 1.56* 1.67* 1.31* 1.27* 1.57*  CALCIUM 9.0 9.3 9.8 9.5 9.6   Liver Function Tests: Recent Labs  Lab 06/10/19 0740 06/11/19 1223 06/12/19 0604  AST 25 23 20   ALT 19 22 19   ALKPHOS 91 95 77  BILITOT 1.3* 1.4* 1.0  PROT 8.6* 8.3* 7.1  ALBUMIN 4.5 4.5 3.8   No results for input(s): LIPASE, AMYLASE in the last 168 hours. No results for input(s): AMMONIA in the last 168 hours. CBC: Recent Labs  Lab 06/10/19 0740 06/11/19 1223 06/12/19 0604 06/13/19 0915 06/14/19 0648 06/15/19 0621  WBC 15.9* 13.6* 12.8* 12.8* 12.7* 12.1*  NEUTROABS 12.3* 10.0* 8.5*  --   --   --   HGB 9.5* 9.6* 8.3* 8.3* 8.6* 8.2*  HCT 27.8* 28.1* 24.2* 24.4* 24.9* 23.5*  MCV 86.3 86.7 86.4 86.5 85.6 85.5  PLT 198 197 174 172 165 172   Cardiac Enzymes: No results for input(s): CKTOTAL, CKMB, CKMBINDEX, TROPONINI in the last 168  hours. BNP: Invalid input(s): POCBNP CBG: Recent Labs  Lab 06/15/19 1153 06/15/19 1635 06/15/19 2119 06/16/19 0726 06/16/19 1113  GLUCAP 105* 157* 156* 102* 97    Time coordinating discharge: 50 minutes  Signed:   Donia Pounds  APRN, MSN, FNP-C [facility] [address] [place], [place] [address] [phone]  [facility] 06/16/2019,  4:12 PM

## 2019-06-16 NOTE — Progress Notes (Signed)
Patient has discharged to home on 06/16/2019. Discharge instruction including medication and appointment was given to patient. Patient has no question at this time.

## 2019-09-30 ENCOUNTER — Other Ambulatory Visit: Payer: Self-pay | Admitting: Pulmonary Disease

## 2019-09-30 DIAGNOSIS — Z1231 Encounter for screening mammogram for malignant neoplasm of breast: Secondary | ICD-10-CM

## 2019-11-22 ENCOUNTER — Other Ambulatory Visit: Payer: Self-pay

## 2019-11-22 ENCOUNTER — Ambulatory Visit
Admission: RE | Admit: 2019-11-22 | Discharge: 2019-11-22 | Disposition: A | Discharge status: Home / Self Care | Payer: Medicare Other | Source: Ambulatory Visit | Attending: Pulmonary Disease | Admitting: Pulmonary Disease

## 2019-11-22 DIAGNOSIS — Z1231 Encounter for screening mammogram for malignant neoplasm of breast: Secondary | ICD-10-CM

## 2020-09-27 DIAGNOSIS — H401132 Primary open-angle glaucoma, bilateral, moderate stage: Secondary | ICD-10-CM | POA: Insufficient documentation

## 2020-09-27 DIAGNOSIS — H35371 Puckering of macula, right eye: Secondary | ICD-10-CM | POA: Insufficient documentation

## 2020-09-27 DIAGNOSIS — H43812 Vitreous degeneration, left eye: Secondary | ICD-10-CM | POA: Insufficient documentation

## 2020-09-27 DIAGNOSIS — E113553 Type 2 diabetes mellitus with stable proliferative diabetic retinopathy, bilateral: Secondary | ICD-10-CM | POA: Insufficient documentation

## 2020-09-27 DIAGNOSIS — Z794 Long term (current) use of insulin: Secondary | ICD-10-CM | POA: Insufficient documentation

## 2020-10-02 ENCOUNTER — Other Ambulatory Visit: Payer: Self-pay

## 2020-10-02 ENCOUNTER — Ambulatory Visit (INDEPENDENT_AMBULATORY_CARE_PROVIDER_SITE_OTHER): Payer: Medicare Other | Admitting: Ophthalmology

## 2020-10-02 ENCOUNTER — Encounter (INDEPENDENT_AMBULATORY_CARE_PROVIDER_SITE_OTHER): Payer: Self-pay | Admitting: Ophthalmology

## 2020-10-02 DIAGNOSIS — H35371 Puckering of macula, right eye: Secondary | ICD-10-CM | POA: Diagnosis not present

## 2020-10-02 DIAGNOSIS — H43812 Vitreous degeneration, left eye: Secondary | ICD-10-CM

## 2020-10-02 DIAGNOSIS — Z794 Long term (current) use of insulin: Secondary | ICD-10-CM

## 2020-10-02 DIAGNOSIS — H401132 Primary open-angle glaucoma, bilateral, moderate stage: Secondary | ICD-10-CM

## 2020-10-02 DIAGNOSIS — E113553 Type 2 diabetes mellitus with stable proliferative diabetic retinopathy, bilateral: Secondary | ICD-10-CM

## 2020-10-02 DIAGNOSIS — H43821 Vitreomacular adhesion, right eye: Secondary | ICD-10-CM

## 2020-10-02 NOTE — Assessment & Plan Note (Signed)
Nonpathologic, visible only on OCT

## 2020-10-02 NOTE — Progress Notes (Signed)
Been 9 months    10/02/2020     CHIEF COMPLAINT Patient presents for Retina Follow Up   HISTORY OF PRESENT ILLNESS: [name] is a 74 y.o. female who presents to the clinic today for:   HPI    Retina Follow Up    Patient presents with  Diabetic Retinopathy (ERM).  In both eyes.  Severity is moderate.  Duration of 9 months.  Since onset it is stable.          Comments    9 Month PDR f\u OU. FP and OCT  Pt states no changes in vision. Denies any complaints. BGL: 129 A1C: 4.4       Last edited by Tilda Franco on 10/02/2020 10:45 AM. (History)      Referring physician: Vincente Liberty, MD [address] [place],  [place] [address]  HISTORICAL INFORMATION:   Selected notes from the MEDICAL RECORD NUMBER    Lab Results  Component Value Date   HGBA1C 4.4 (L) 06/12/2019     CURRENT MEDICATIONS: Current Outpatient Medications (Ophthalmic Drugs)  Medication Sig  . cycloSPORINE (RESTASIS) 0.05 % ophthalmic emulsion Place 1 drop into both eyes 2 (two) times daily.  Marland Kitchen LUMIGAN 0.01 % SOLN Place 1 drop into both eyes at bedtime. Separate by at least 10 minutes from other intraocular pressure reducing ophthalmic drugs  . timolol (TIMOPTIC) 0.5 % ophthalmic solution Place 1 drop into both eyes 2 (two) times daily.  . Travoprost, BAK Free, (TRAVATAN) 0.004 % SOLN ophthalmic solution Place 1 drop into both eyes at bedtime.   No current facility-administered medications for this visit. (Ophthalmic Drugs)   Current Outpatient Medications (Other)  Medication Sig  . acetaminophen (TYLENOL) 500 MG tablet Take 1,000 mg by mouth every 6 (six) hours as needed for mild pain or moderate pain.   Marland Kitchen amLODipine (NORVASC) 10 MG tablet Take 10 mg by mouth daily.  Marland Kitchen aspirin EC 81 MG tablet Take 81 mg by mouth daily.  Marland Kitchen atorvastatin (LIPITOR) 40 MG tablet Take 40 mg by mouth at bedtime.   . Calcium Carb-Cholecalciferol 600-800 MG-UNIT TABS Take 1 tablet by mouth daily.  .  ferrous sulfate 325 (65 FE) MG tablet Take 325 mg by mouth daily with breakfast.  . folic acid (FOLVITE) 1 MG tablet Take 1 mg by mouth daily.   Marland Kitchen glipiZIDE (GLUCOTROL XL) 5 MG 24 hr tablet Take 5 mg by mouth daily with breakfast.   . insulin glargine (LANTUS) 100 UNIT/ML injection Inject 0.18 mLs (18 Units total) into the skin at bedtime.  Marland Kitchen levothyroxine (SYNTHROID, LEVOTHROID) 75 MCG tablet Take 75 mcg by mouth daily before breakfast.  . losartan (COZAAR) 100 MG tablet Take 100 mg by mouth daily.  . metoprolol succinate (TOPROL-XL) 50 MG 24 hr tablet Take 50 mg by mouth daily. Take with or immediately following a meal.  . oxyCODONE 10 MG TABS Take 1 tablet (10 mg total) by mouth every 6 (six) hours as needed for severe pain.  Marland Kitchen spironolactone (ALDACTONE) 25 MG tablet Take 25 mg by mouth daily.   No current facility-administered medications for this visit. (Other)      REVIEW OF SYSTEMS: ROS    Positive for: Endocrine   Last edited by Tilda Franco on 10/02/2020 10:45 AM. (History)       ALLERGIES Allergies  Allergen Reactions  . Bee Venom Swelling  . Nsaids Other (See Comments)    Dyspepsia. Advised by Primary Provider to avoid because  of Kidneys    PAST MEDICAL HISTORY Past Medical History:  Diagnosis Date  . Abdominal pain   . Coronary artery disease   . Diabetes mellitus without complication ([facility])   . Glaucoma   . Hypertension   . Hypothyroid   . Renal insufficiency 09/01/2017  . Sickle cell anemia (HCC)    Past Surgical History:  Procedure Laterality Date  . CHOLECYSTECTOMY    . HIP SURGERY     6 yrs ago  . REFRACTIVE SURGERY    . RETINAL DETACHMENT SURGERY      FAMILY HISTORY Family History  Problem Relation Age of Onset  . Diabetes Mother   . Cancer Father        Prostate  . Diabetes Father   . Cancer Brother   . Diabetes Brother   . Breast cancer Maternal Aunt        does not know age    SOCIAL HISTORY Social History   Tobacco Use  .  Smoking status: Former Research scientist (life sciences)  . Smokeless tobacco: Never Used  Vaping Use  . Vaping Use: Never used  Substance Use Topics  . Alcohol use: No  . Drug use: No         OPHTHALMIC EXAM: Base Eye Exam    Visual Acuity (Snellen - Linear)      Right Left   Dist [place] 20/30 20/60 -1   Dist ph [place]  20/30       Tonometry (Tonopen, 10:50 AM)      Right Left   Pressure 16 18       Pupils      Pupils Dark Light Shape React APD   Right PERRL 4 3 Round Slow None   Left PERRL 4 3 Round Slow None       Visual Fields (Counting fingers)      Left Right    Full Full       Neuro/Psych    Oriented x3: Yes   Mood/Affect: Normal       Dilation    Both eyes: 1.0% Mydriacyl, 2.5% Phenylephrine @ 10:50 AM        Slit Lamp and Fundus Exam    External Exam      Right Left   External Normal Normal       Slit Lamp Exam      Right Left   Lids/Lashes Normal Normal   Conjunctiva/Sclera White and quiet White and quiet   Cornea Clear Clear   Anterior Chamber Deep and quiet Deep and quiet   Iris Round and reactive Round and reactive   Lens Centered posterior chamber intraocular lens Centered posterior chamber intraocular lens   Anterior Vitreous Normal Normal       Fundus Exam      Right Left   Posterior Vitreous Normal Vitrectomized   Disc Normal Normal   C/D Ratio 0.35 0.45   Macula Microaneurysms, no macular thickening, minimal clinical epiretinal membrane noted. Microaneurysms, no macular thickening   Vessels PDR-quiet PDR-quiet   Periphery good PRP good PRP          IMAGING AND PROCEDURES  Imaging and Procedures for 10/02/20  OCT, Retina - OU - Both Eyes       Right Eye Quality was good. Scan locations included subfoveal. Central Foveal Thickness: 381. Findings include epiretinal membrane, abnormal foveal contour.   Left Eye Scan locations included subfoveal. Central Foveal Thickness: 299. Progression has been stable. Findings include normal observations.    Notes  Minor epiretinal membrane OD, no significant impact on acuity no significant TOPA distortion mild thickening No outer retinal changes.       Color Fundus Photography Optos - OU - Both Eyes       Right Eye Progression has been stable. Disc findings include normal observations. Macula : epiretinal membrane.   Left Eye Progression has been stable. Disc findings include normal observations. Macula : normal observations.   Notes Bilateral quiescent proliferative diabetic retinopathy.  OS with large old fibrovascular fronds well surrounded by PRP, no tractional changes, no traction on the macula, no active maculopathy OU.                ASSESSMENT/PLAN:  Right epiretinal membrane The nature of macular pucker (epiretinal membrane ERM) was discussed with the patient as well as threshold criteria for vitrectomy surgery. I explained that in rare cases another surgery is needed to actually remove a second wrinkle should it regrow.  Most often, the epiretinal membrane and underlying wrinkled internal limiting membrane are removed with the first surgery, to accomplish the goals.   If the operative eye is Phak[place] (natural lens still present), cataract surgery is often recommended prior to Vitrectomy. This will enable the retina surgeon to have the best view during surgery and the patient to obtain optimal results in the future. Treatment options were discussed. Minor and observe  Controlled type 2 diabetes mellitus with stable proliferative retinopathy of both eyes, with long-term current use of insulin ([place]) The nature of regressed proliferative diabetic retinopathy was discussed with the patient. The patient was advised to maintain good glucose, blood pressure, monitor kidney function and serum lipid control as advised by personal physician. Rare risk for reactivation of progression exist with untreated severe anemia, untreated renal failure, untreated heart failure, and  smoking. Complete avoidance of smoking was recommended. The chance of recurrent proliferative diabetic retinopathy was discussed as well as the chance of vitreous hemorrhage for which further treatments may be necessary.   Explained to the patient that the quiescent  proliferative diabetic retinopathy disease is unlikely to ever worsen.  Worsening factors would include however severe anemia, hypertension out-of-control or impending renal failure.  Vitreomacular adhesion of right eye Nonpathologic, visible only on OCT      ICD-10-CM   1. Right epiretinal membrane  H35.371 OCT, Retina - OU - Both Eyes  2. Posterior vitreous detachment of left eye  H43.812   3. Controlled type 2 diabetes mellitus with stable proliferative retinopathy of both eyes, with long-term current use of insulin (HCC)  E11.3553 OCT, Retina - OU - Both Eyes   Z79.4 Color Fundus Photography Optos - OU - Both Eyes  4. Primary open angle glaucoma of both eyes, moderate stage  H40.1132   5. Vitreomacular adhesion of right eye  H43.821     1.  2.  3.  Ophthalmic Meds Ordered this visit:  No orders of the defined types were placed in this encounter.      Return in about 9 months (around 07/02/2021) for DILATE OU, OCT.  There are no Patient Instructions on file for this visit.   Explained the diagnoses, plan, and follow up with the patient and they expressed understanding.  Patient expressed understanding of the importance of proper follow up care.   Clent Demark Rica Heather M.D. Diseases & Surgery of the Retina and Vitreous [facility] [place] 10/02/20     Abbreviations: M myopia (nearsighted); A astigmatism; H hyperopia (farsighted); P presbyopia; Mrx spectacle prescription;  CTL contact lenses; OD right eye; OS left eye; OU both eyes  XT exotropia; ET esotropia; PEK punctate epithelial keratitis; PEE punctate epithelial erosions; DES dry eye syndrome; MGD meibomian gland dysfunction; ATs artificial tears;  PFAT's preservative free artificial tears; [place] nuclear sclerotic cataract; PSC posterior subcapsular cataract; ERM epi-retinal membrane; PVD posterior vitreous detachment; RD retinal detachment; DM diabetes mellitus; DR diabetic retinopathy; NPDR non-proliferative diabetic retinopathy; PDR proliferative diabetic retinopathy; CSME clinically significant macular edema; DME diabetic macular edema; dbh dot blot hemorrhages; CWS cotton wool spot; POAG primary open angle glaucoma; C/D cup-to-disc ratio; HVF humphrey visual field; GVF goldmann visual field; OCT optical coherence tomography; IOP intraocular pressure; BRVO Branch retinal vein occlusion; CRVO central retinal vein occlusion; CRAO central retinal artery occlusion; BRAO branch retinal artery occlusion; RT retinal tear; SB scleral buckle; PPV pars plana vitrectomy; VH Vitreous hemorrhage; PRP panretinal laser photocoagulation; IVK intravitreal kenalog; VMT vitreomacular traction; MH Macular hole;  NVD neovascularization of the disc; NVE neovascularization elsewhere; AREDS age related eye disease study; ARMD age related macular degeneration; POAG primary open angle glaucoma; EBMD epithelial/anterior basement membrane dystrophy; ACIOL anterior chamber intraocular lens; IOL intraocular lens; PCIOL posterior chamber intraocular lens; Phaco/IOL phacoemulsification with intraocular lens placement; [place] photorefractive keratectomy; LASIK laser assisted in situ keratomileusis; HTN hypertension; DM diabetes mellitus; COPD chronic obstructive pulmonary disease

## 2020-10-02 NOTE — Assessment & Plan Note (Signed)
The nature of macular pucker (epiretinal membrane ERM) was discussed with the patient as well as threshold criteria for vitrectomy surgery. I explained that in rare cases another surgery is needed to actually remove a second wrinkle should it regrow.  Most often, the epiretinal membrane and underlying wrinkled internal limiting membrane are removed with the first surgery, to accomplish the goals.   If the operative eye is Phakic (natural lens still present), cataract surgery is often recommended prior to Vitrectomy. This will enable the retina surgeon to have the best view during surgery and the patient to obtain optimal results in the future. Treatment options were discussed. Minor and observe

## 2020-10-02 NOTE — Assessment & Plan Note (Signed)

## 2020-10-16 ENCOUNTER — Other Ambulatory Visit: Payer: Self-pay | Admitting: Pulmonary Disease

## 2020-10-16 DIAGNOSIS — Z1231 Encounter for screening mammogram for malignant neoplasm of breast: Secondary | ICD-10-CM

## 2020-10-16 IMAGING — DX THORACIC SPINE 2 VIEWS
3 series · 3 of 3 positions shown · non-contrast
Comparison: Chest radiograph, 08/31/2017

CLINICAL DATA: Pt c/o central cp x 3 days, w/ upper back pain as
well, pt hx of sickle cell anemia.

EXAM:
THORACIC SPINE 2 VIEWS

[t-spine ap]
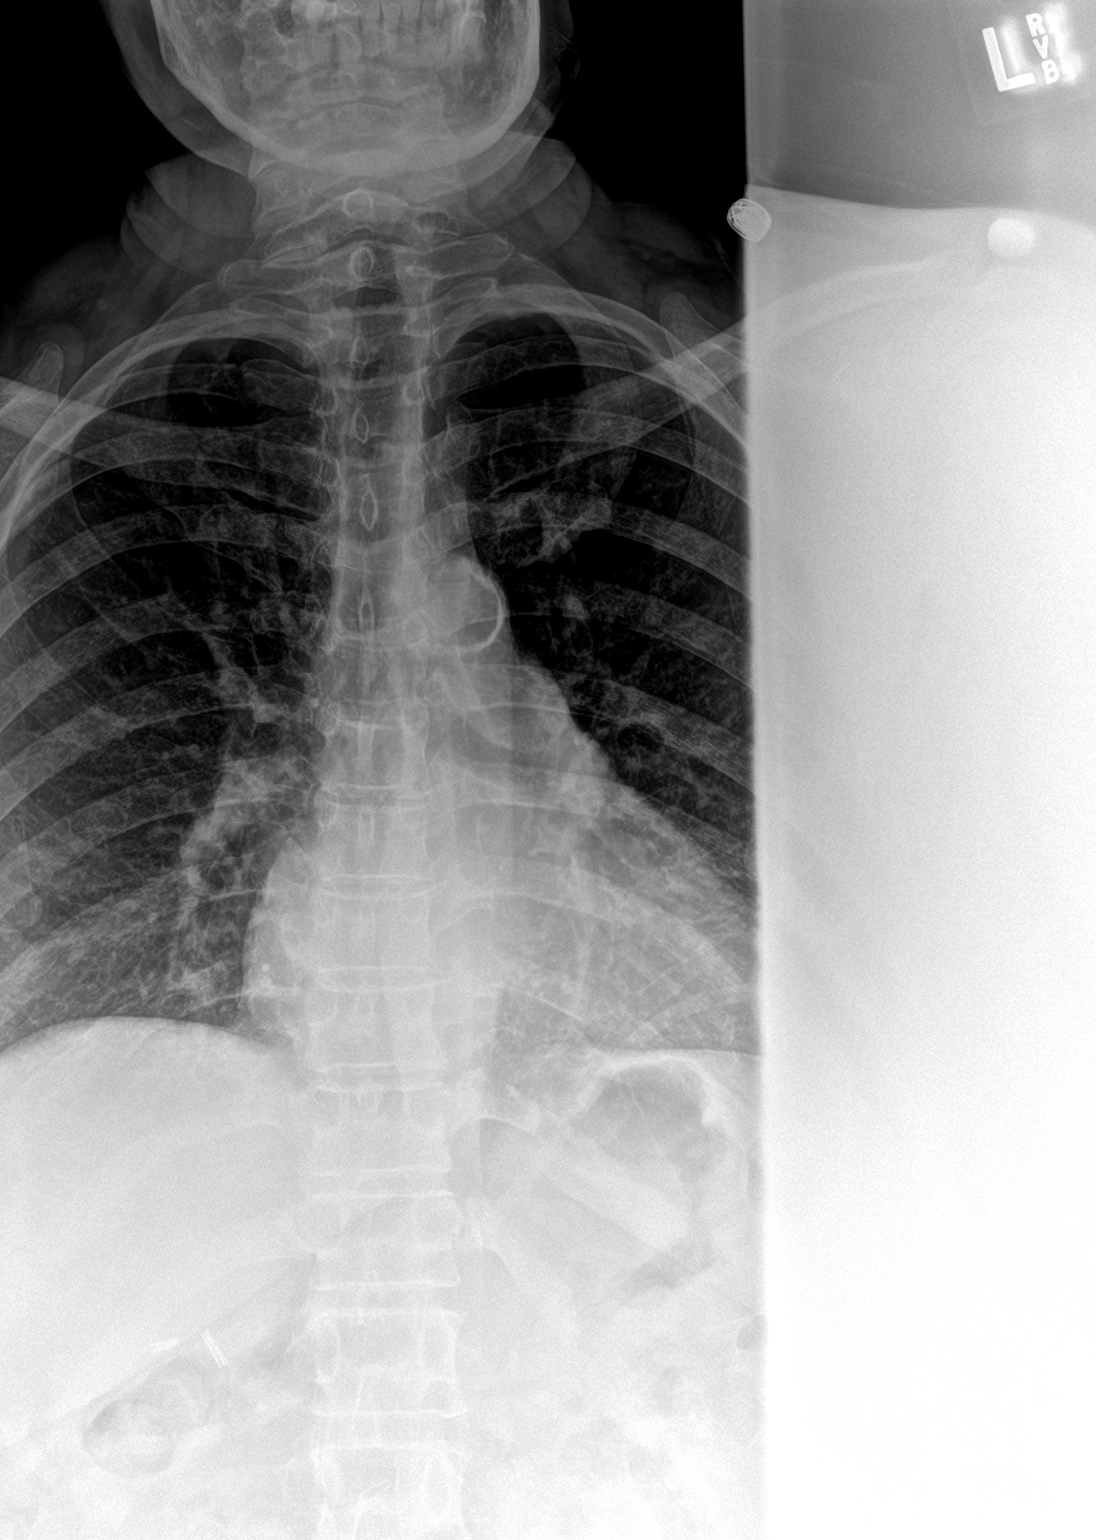

[t-spine lat]
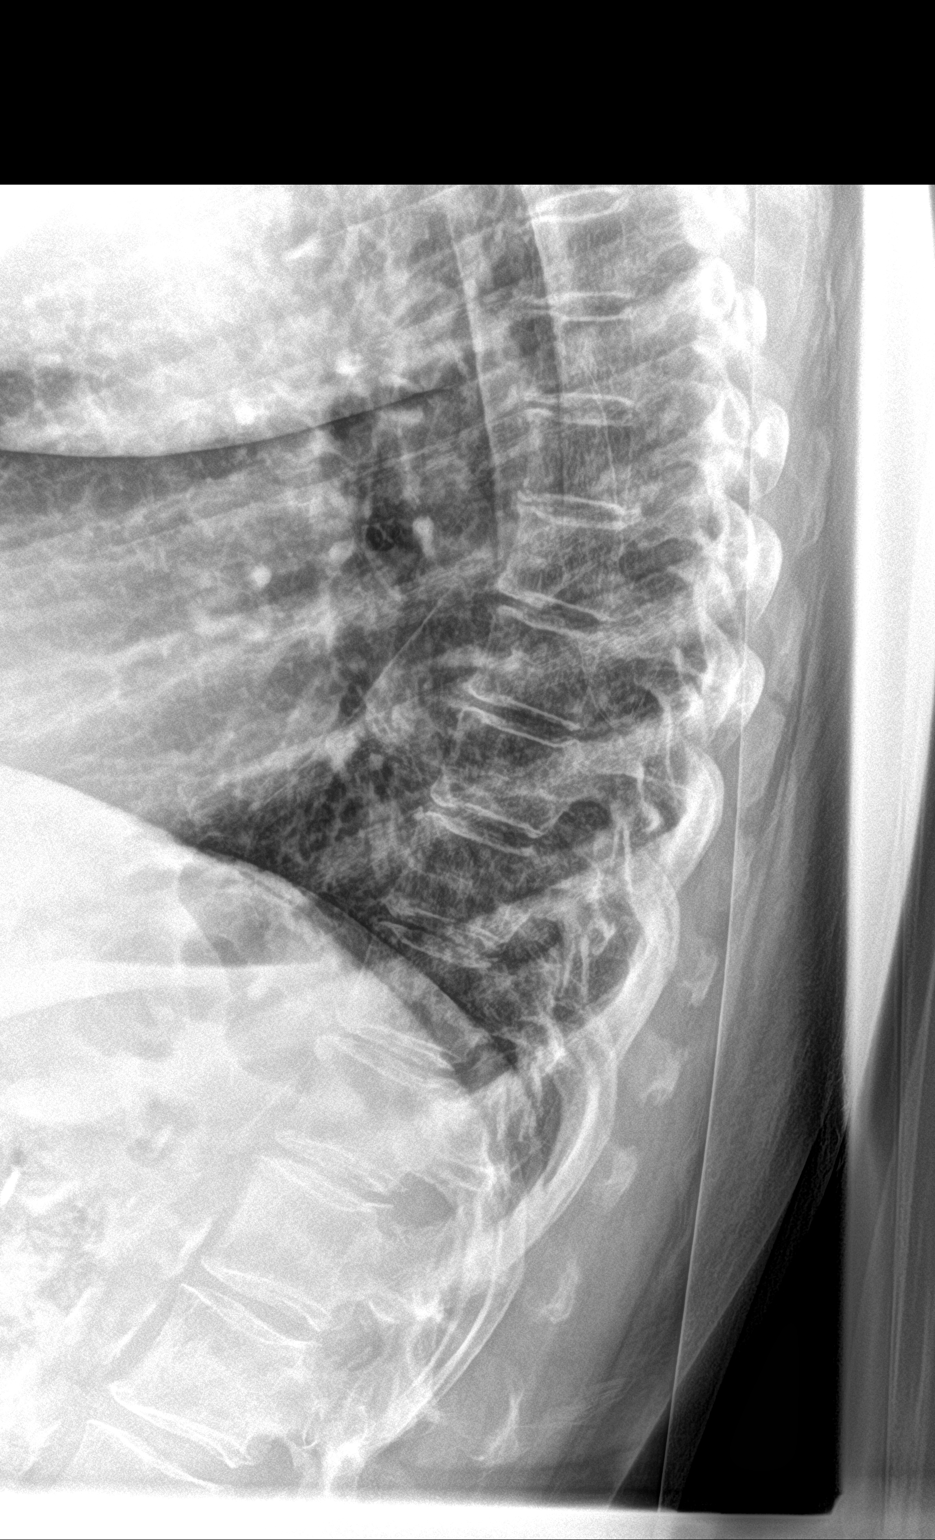

[t-spine swimmers]
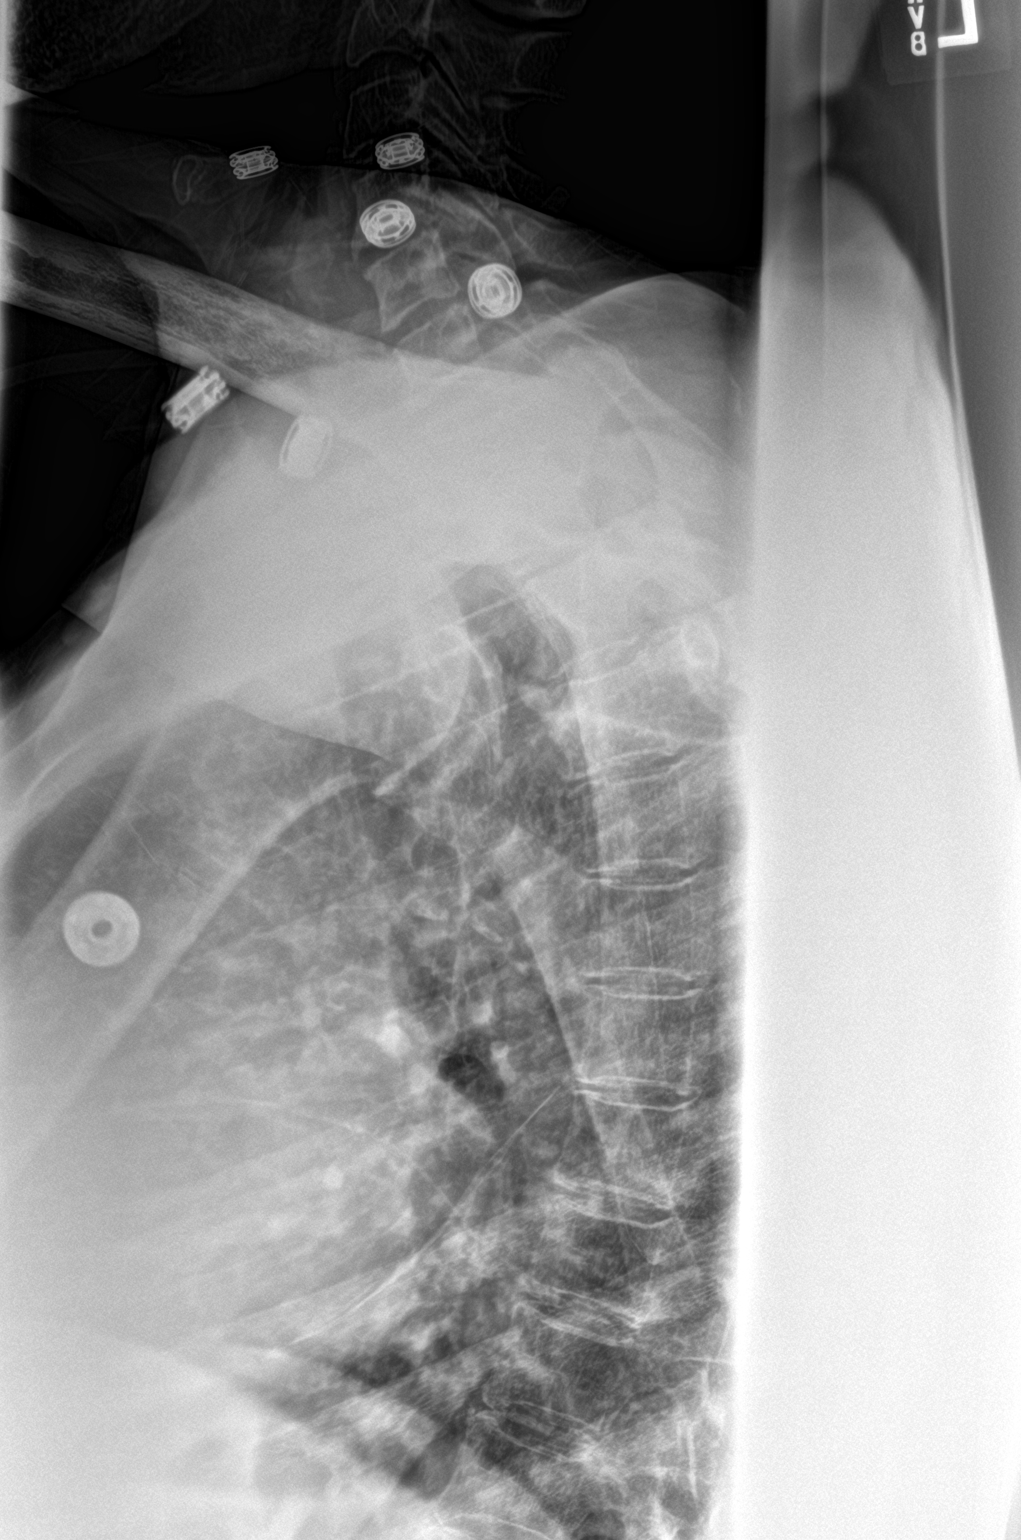

[3 of 3 positions shown; findings below may reference images not displayed]

FINDINGS: No fracture, bone lesion or spondylolisthesis. No significant
degenerative changes. Soft tissues are unremarkable.
IMPRESSION: Negative.

## 2020-10-16 IMAGING — DX CHEST - 2 VIEW
2 series · 2 of 2 positions shown · non-contrast
Comparison: 05/29/2018

CLINICAL DATA: Pt c/o central cp x 3 days, w/ upper back pain as
well, pt hx of sickle cell anemia.

EXAM:
CHEST - 2 VIEW

[chest lat]
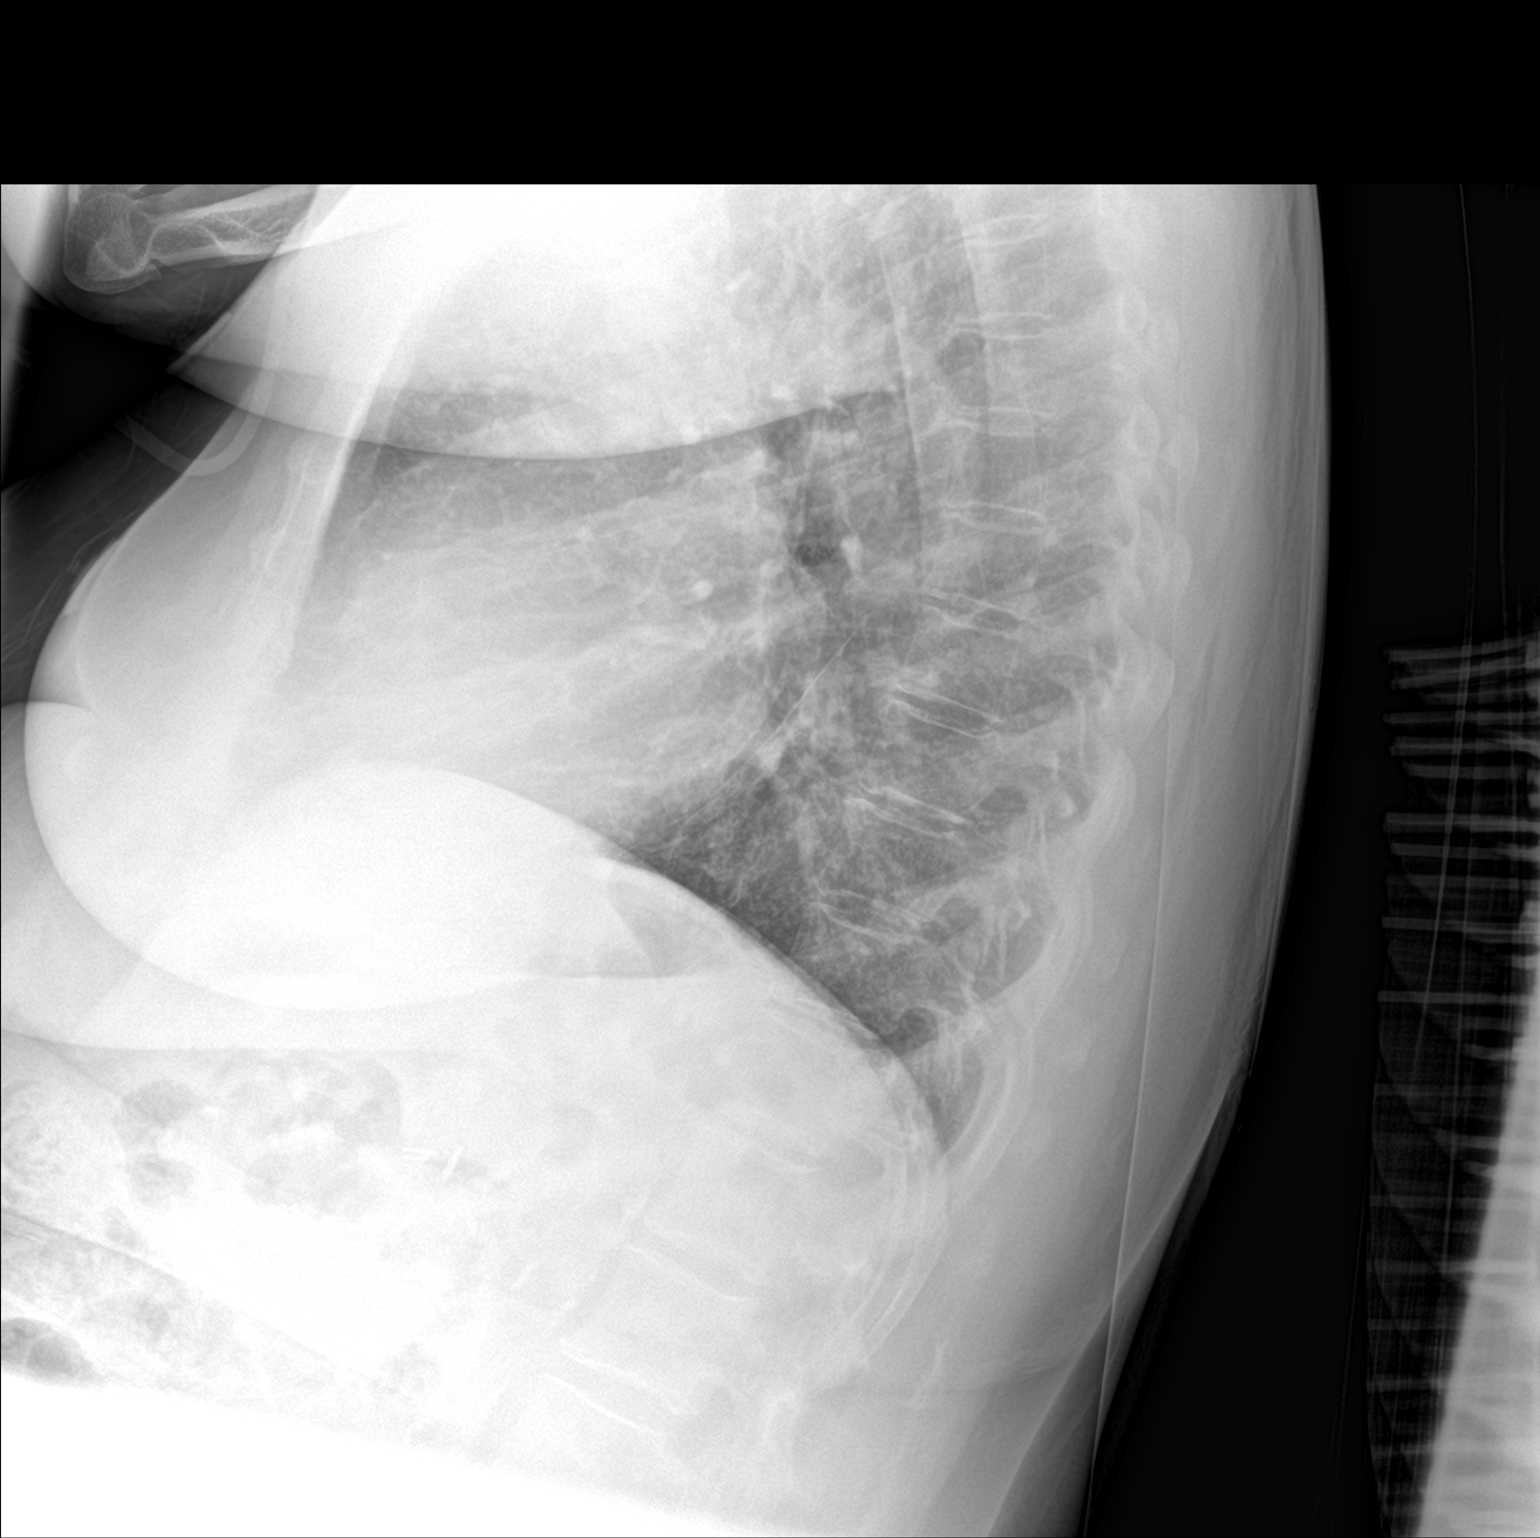

[chest ap]
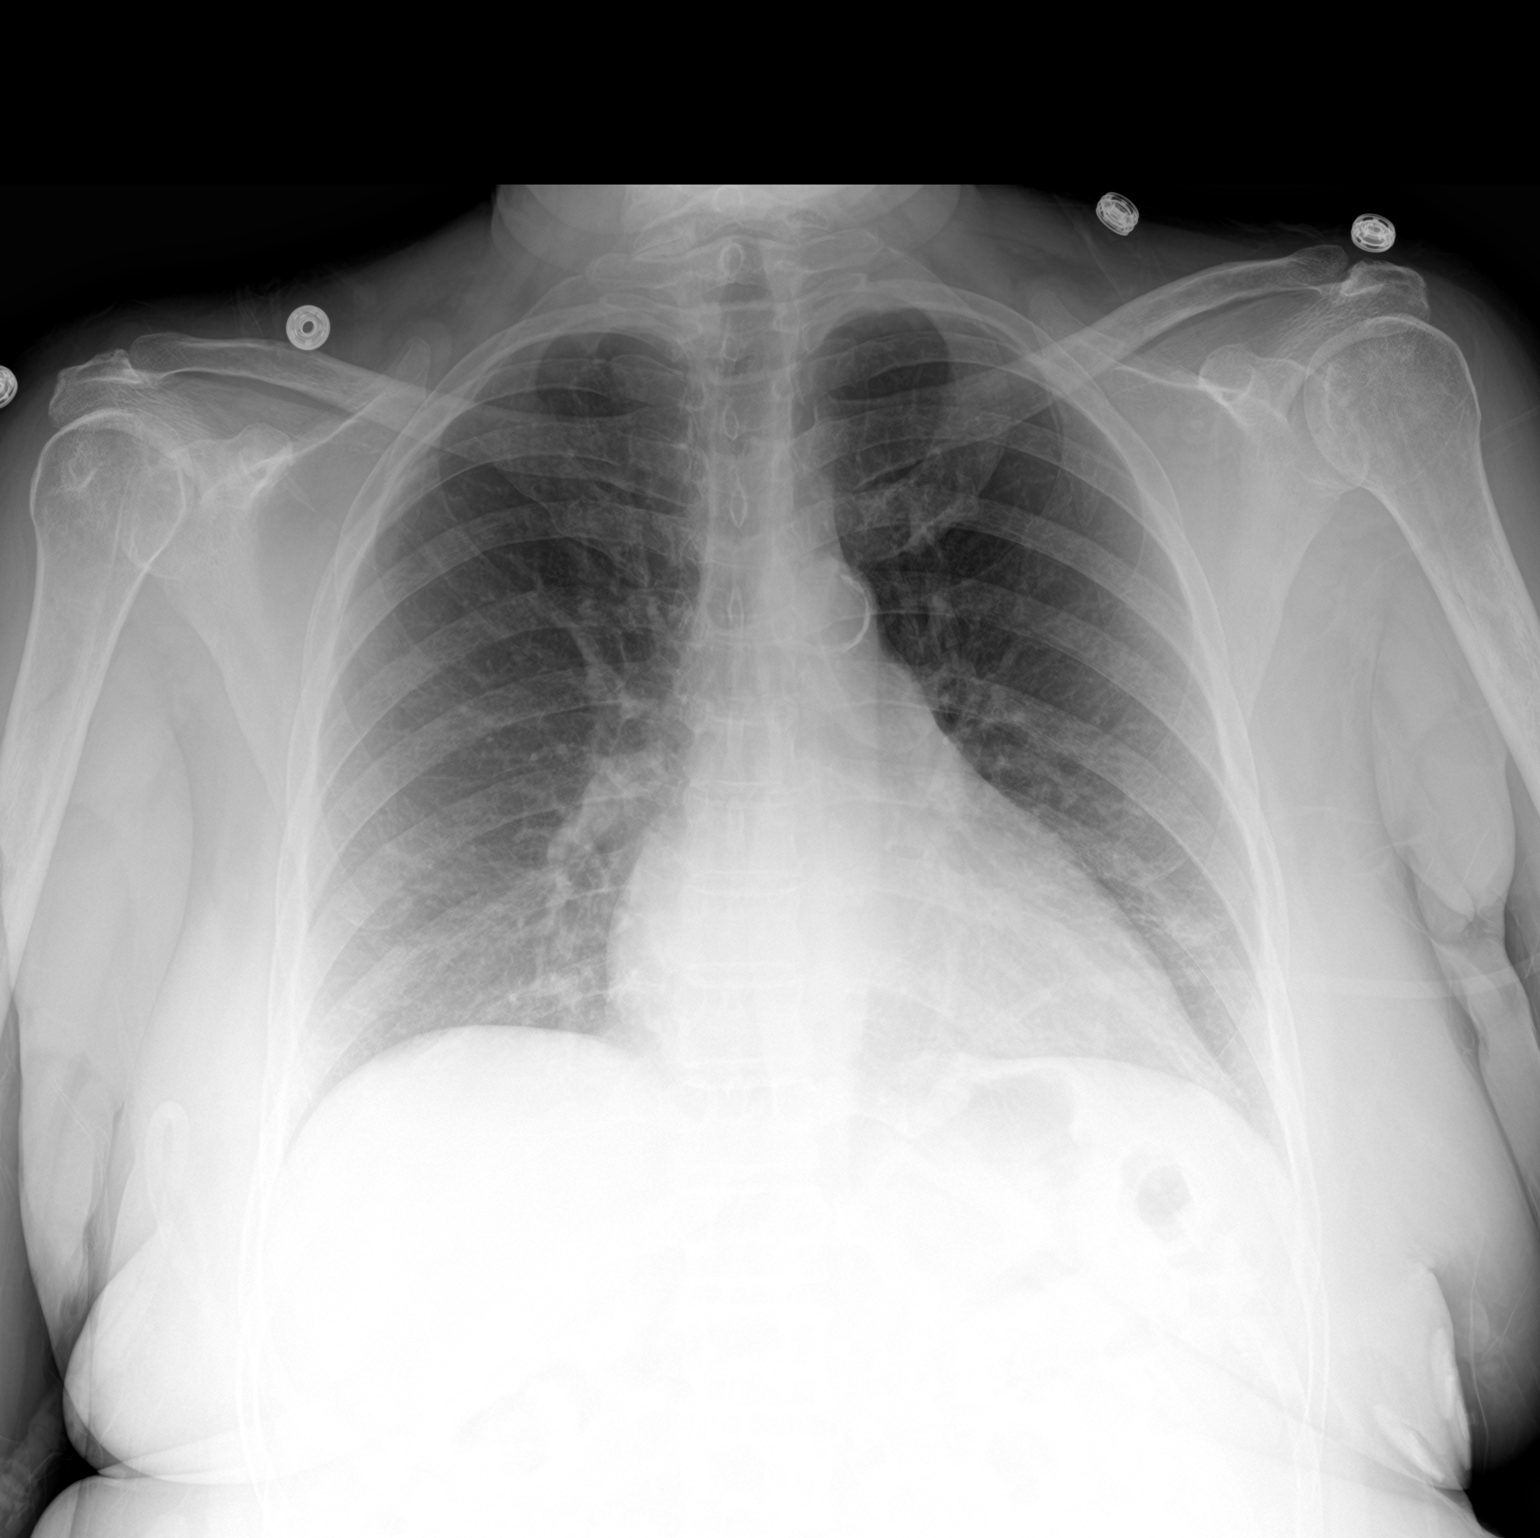

[2 of 2 positions shown; findings below may reference images not displayed]

FINDINGS: Cardiac silhouette is normal in size. No mediastinal or hilar masses
or evidence of adenopathy.

Clear lungs.  No pleural effusion or pneumothorax.

No acute skeletal abnormality.
IMPRESSION: No active cardiopulmonary disease.

## 2020-11-27 ENCOUNTER — Other Ambulatory Visit: Payer: Self-pay

## 2020-11-27 ENCOUNTER — Ambulatory Visit
Admission: RE | Admit: 2020-11-27 | Discharge: 2020-11-27 | Disposition: A | Discharge status: Home / Self Care | Payer: Medicare Other | Source: Ambulatory Visit | Attending: Pulmonary Disease | Admitting: Pulmonary Disease

## 2020-11-27 DIAGNOSIS — Z1231 Encounter for screening mammogram for malignant neoplasm of breast: Secondary | ICD-10-CM

## 2021-03-30 ENCOUNTER — Emergency Department (HOSPITAL_COMMUNITY): Payer: 59

## 2021-03-30 ENCOUNTER — Other Ambulatory Visit: Payer: Self-pay

## 2021-03-30 ENCOUNTER — Emergency Department (HOSPITAL_COMMUNITY)
Admission: EM | Admit: 2021-03-30 | Discharge: 2021-03-30 | Disposition: A | Discharge status: Home / Self Care | Payer: 59 | Attending: Emergency Medicine | Admitting: Emergency Medicine

## 2021-03-30 DIAGNOSIS — D5700 Hb-SS disease with crisis, unspecified: Secondary | ICD-10-CM

## 2021-03-30 DIAGNOSIS — Z794 Long term (current) use of insulin: Secondary | ICD-10-CM | POA: Insufficient documentation

## 2021-03-30 DIAGNOSIS — I2510 Atherosclerotic heart disease of native coronary artery without angina pectoris: Secondary | ICD-10-CM | POA: Diagnosis not present

## 2021-03-30 DIAGNOSIS — I129 Hypertensive chronic kidney disease with stage 1 through stage 4 chronic kidney disease, or unspecified chronic kidney disease: Secondary | ICD-10-CM | POA: Diagnosis not present

## 2021-03-30 DIAGNOSIS — E1122 Type 2 diabetes mellitus with diabetic chronic kidney disease: Secondary | ICD-10-CM | POA: Diagnosis not present

## 2021-03-30 DIAGNOSIS — Z79899 Other long term (current) drug therapy: Secondary | ICD-10-CM | POA: Insufficient documentation

## 2021-03-30 DIAGNOSIS — E039 Hypothyroidism, unspecified: Secondary | ICD-10-CM | POA: Insufficient documentation

## 2021-03-30 DIAGNOSIS — N184 Chronic kidney disease, stage 4 (severe): Secondary | ICD-10-CM | POA: Diagnosis not present

## 2021-03-30 DIAGNOSIS — M25512 Pain in left shoulder: Secondary | ICD-10-CM

## 2021-03-30 DIAGNOSIS — Z7982 Long term (current) use of aspirin: Secondary | ICD-10-CM | POA: Insufficient documentation

## 2021-03-30 DIAGNOSIS — Z87891 Personal history of nicotine dependence: Secondary | ICD-10-CM | POA: Diagnosis not present

## 2021-03-30 DIAGNOSIS — R109 Unspecified abdominal pain: Secondary | ICD-10-CM

## 2021-03-30 LAB — COMPREHENSIVE METABOLIC PANEL
ALT: 26 U/L (ref 0–44)
AST: 21 U/L (ref 15–41)
Albumin: 4.5 g/dL (ref 3.5–5.0)
Alkaline Phosphatase: 113 U/L (ref 38–126)
Anion gap: 8 (ref 5–15)
BUN: 35 mg/dL — ABNORMAL HIGH (ref 8–23)
CO2: 21 mmol/L — ABNORMAL LOW (ref 22–32)
Calcium: 10.4 mg/dL — ABNORMAL HIGH (ref 8.9–10.3)
Chloride: 109 mmol/L (ref 98–111)
Creatinine, Ser: 1.54 mg/dL — ABNORMAL HIGH (ref 0.44–1.00)
GFR, Estimated: 36 mL/min — ABNORMAL LOW (ref >60)
Glucose, Bld: 204 mg/dL — ABNORMAL HIGH (ref 70–99)
Potassium: 4.2 mmol/L (ref 3.5–5.1)
Sodium: 138 mmol/L (ref 135–145)
Total Bilirubin: 1.2 mg/dL (ref 0.3–1.2)
Total Protein: 8.5 g/dL — ABNORMAL HIGH (ref 6.5–8.1)

## 2021-03-30 LAB — CBC WITH DIFFERENTIAL/PLATELET
Abs Immature Granulocytes: 0.06 10*3/uL (ref 0.00–0.07)
Basophils Absolute: 0.1 10*3/uL (ref 0.0–0.1)
Basophils Relative: 0 %
Eosinophils Absolute: 0.1 10*3/uL (ref 0.0–0.5)
Eosinophils Relative: 0 %
HCT: 30.6 % — ABNORMAL LOW (ref 36.0–46.0)
Hemoglobin: 10.7 g/dL — ABNORMAL LOW (ref 12.0–15.0)
Immature Granulocytes: 0 %
Lymphocytes Relative: 14 %
Lymphs Abs: 2.4 10*3/uL (ref 0.7–4.0)
MCH: 28.6 pg (ref 26.0–34.0)
MCHC: 35 g/dL (ref 30.0–36.0)
MCV: 81.8 fL (ref 80.0–100.0)
Monocytes Absolute: 1.4 10*3/uL — ABNORMAL HIGH (ref 0.1–1.0)
Monocytes Relative: 8 %
Neutro Abs: 12.8 10*3/uL — ABNORMAL HIGH (ref 1.7–7.7)
Neutrophils Relative %: 78 %
Platelets: 285 10*3/uL (ref 150–400)
RBC: 3.74 MIL/uL — ABNORMAL LOW (ref 3.87–5.11)
RDW: 15.9 % — ABNORMAL HIGH (ref 11.5–15.5)
WBC: 16.7 10*3/uL — ABNORMAL HIGH (ref 4.0–10.5)
nRBC: 0.8 % — ABNORMAL HIGH (ref 0.0–0.2)

## 2021-03-30 LAB — URINALYSIS, ROUTINE W REFLEX MICROSCOPIC
Bacteria, UA: NONE SEEN
Bilirubin Urine: NEGATIVE
Glucose, UA: NEGATIVE mg/dL
Hgb urine dipstick: NEGATIVE
Ketones, ur: NEGATIVE mg/dL
Leukocytes,Ua: NEGATIVE
Nitrite: NEGATIVE
Protein, ur: 100 mg/dL — AB
Specific Gravity, Urine: 1.011 (ref 1.005–1.030)
pH: 6 (ref 5.0–8.0)

## 2021-03-30 LAB — RETICULOCYTES
Immature Retic Fract: 29.6 % — ABNORMAL HIGH (ref 2.3–15.9)
RBC.: 3.65 MIL/uL — ABNORMAL LOW (ref 3.87–5.11)
Retic Count, Absolute: 121.9 10*3/uL (ref 19.0–186.0)
Retic Ct Pct: 3.3 % — ABNORMAL HIGH (ref 0.4–3.1)

## 2021-03-30 MED ORDER — MORPHINE SULFATE (PF) 2 MG/ML IV SOLN: 2.0000 mg | Freq: Once | INTRAVENOUS | Status: DC

## 2021-03-30 MED ORDER — MORPHINE SULFATE (PF) 4 MG/ML IV SOLN
4.0000 mg | INTRAVENOUS | Status: AC
  Administered 2021-03-30 (×3): 4 mg via INTRAVENOUS
  Filled 2021-03-30 (×3): qty 1

## 2021-03-30 MED ORDER — SODIUM CHLORIDE 0.45 % IV SOLN: INTRAVENOUS | Status: DC

## 2021-03-30 NOTE — Discharge Instructions (Addendum)
Continue taking her medications as prescribed. Follow-up with your primary care doctor for recheck of your symptoms. Return to emergency room if you develop fever, chest pain, difficulty breathing, severe worsening pain, or any new, worsening, or concerning symptoms.

## 2021-03-30 NOTE — ED Triage Notes (Signed)
Patient reports SCC , pain is 10/10 in left face, left arm , right hip area. Patient says pain started two days ago, home medications not working.

## 2021-03-30 NOTE — ED Notes (Signed)
Patient attached to external female catheter 

## 2021-03-30 NOTE — ED Notes (Signed)
Fall risk bundle: Yellow risk armband Yellow socks Fall risk sign outside of door Bed alarm on

## 2021-03-30 NOTE — ED Notes (Signed)
Family at bedside. 

## 2021-03-30 NOTE — ED Provider Notes (Signed)
[facility] DEPT Provider Note   CSN: [id number] Arrival date & time: 03/30/21  [id number]     History Chief Complaint  Patient presents with  . Sickle Cell Pain Crisis    [name] is a 75 y.o. female presenting for evaluation of pain.  Patient states 2 days ago she developed pain in her right side.  She also developed pain of her left shoulder and neck.  Pain is constant.  Feels typical for her sickle cell pain crises.  She does not have a normal location for her sickle cell pain.  She denies known precipitating event.  She denies fevers, chills, chest pain, shortness of breath, cough, nausea, vomiting, urinary symptoms, abnormal bowel movements.  She has been taking her home medication without improvement of symptoms.  Last dose of her hydrocodone was last night around midnight.  Additional history obtained from chart review.  Patient with a history of sickle cell, CKD, hypertension, diabetes.  HPI     Past Medical History:  Diagnosis Date  . Abdominal pain   . Coronary artery disease   . Diabetes mellitus without complication ([place])   . Glaucoma   . Hypertension   . Hypothyroid   . Renal insufficiency 09/01/2017  . Sickle cell anemia [facility])     Patient Active Problem List   Diagnosis Date Noted  . 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 Right epiretinal membrane 09/27/2020  . Controlled type 2 diabetes mellitus with stable proliferative retinopathy of both eyes, with long-term current use of insulin ([place]) 09/27/2020  . Primary open angle glaucoma of both eyes, moderate stage 09/27/2020  . Sickle cell pain crisis ([place]) 06/11/2019  . Acute renal failure superimposed on stage 4 chronic kidney disease ([place])   . Chronic pain syndrome   . 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 DM2 (diabetes mellitus, type 2) ([place]) 09/01/2017  . HTN (hypertension) 09/01/2017  . CKD (chronic kidney disease), stage III  ([place]) 09/01/2017  . Acute lower UTI 09/01/2017    Past Surgical History:  Procedure Laterality Date  . CHOLECYSTECTOMY    . HIP SURGERY     6 yrs ago  . REFRACTIVE SURGERY    . RETINAL DETACHMENT SURGERY       OB History   No obstetric history on file.     Family History  Problem Relation Age of Onset  . Diabetes Mother   . Cancer Father        Prostate  . Diabetes Father   . Cancer Brother   . Diabetes Brother   . Breast cancer Maternal Aunt        does not know age    Social History   Tobacco Use  . Smoking status: Former Research scientist (life sciences)  . Smokeless tobacco: Never Used  Vaping Use  . Vaping Use: Never used  Substance Use Topics  . Alcohol use: No  . Drug use: No    Home Medications Prior to Admission medications   Medication Sig Start Date End Date Taking? Authorizing Provider  acetaminophen (TYLENOL) 500 MG tablet Take 1,000 mg by mouth every 6 (six) hours as needed for mild pain or moderate pain.    Yes [provider]  amLODipine (NORVASC) 10 MG tablet Take 10 mg by mouth daily.   Yes [provider]  aspirin EC 81 MG tablet Take 81 mg by mouth daily.   Yes [provider]  atorvastatin (LIPITOR) 40 MG tablet  Take 40 mg by mouth at bedtime.    Yes [provider]  Calcium Carb-Cholecalciferol 600-800 MG-UNIT TABS Take 1 tablet by mouth 2 (two) times daily.   Yes [provider]  cycloSPORINE (RESTASIS) 0.05 % ophthalmic emulsion Place 1 drop into both eyes 2 (two) times daily.   Yes [provider]  ferrous sulfate 325 (65 FE) MG tablet Take 325 mg by mouth daily with breakfast.   Yes [provider]  folic acid (FOLVITE) 1 MG tablet Take 1 mg by mouth daily.   Yes [provider]  glipiZIDE (GLUCOTROL XL) 5 MG 24 hr tablet Take 5 mg by mouth 3 (three) times a week. Mon, Wed, Fri   Yes [provider]  insulin glargine (LANTUS) 100 UNIT/ML injection Inject 0.18 mLs (18 Units total) into  the skin at bedtime. 09/02/17  Yes Leana Gamer, MD  levothyroxine (SYNTHROID) 175 MCG tablet Take 175 mcg by mouth daily before breakfast.   Yes [provider]  losartan (COZAAR) 100 MG tablet Take 100 mg by mouth daily.   Yes Vincente Liberty, MD  LUMIGAN 0.01 % SOLN Place 1 drop into both eyes at bedtime. Separate by at least 10 minutes from other intraocular pressure reducing ophthalmic drugs 06/06/19  Yes [provider]  metoprolol succinate (TOPROL-XL) 50 MG 24 hr tablet Take 50 mg by mouth daily. Take with or immediately following a meal.   Yes [provider]  RHOPRESSA 0.02 % SOLN Place 1 drop into the left eye at bedtime. 02/16/21  Yes [provider]  spironolactone (ALDACTONE) 25 MG tablet Take 25 mg by mouth daily. 05/23/19  Yes [provider]  vitamin C (ASCORBIC ACID) 500 MG tablet Take 500 mg by mouth daily.   Yes [provider]  Vitamin D, Ergocalciferol, (DRISDOL) 1.25 MG (50000 UNIT) CAPS capsule Take 50,000 Units by mouth every 7 (seven) days. Monday   Yes [provider]  oxyCODONE 10 MG TABS Take 1 tablet (10 mg total) by mouth every 6 (six) hours as needed for severe pain. Patient not taking: Reported on 03/30/2021 06/16/19   Dorena Dew, FNP    Allergies    Bee venom and Nsaids  Review of Systems   Review of Systems  Gastrointestinal: Positive for abdominal pain.  Musculoskeletal: Positive for arthralgias.  All other systems reviewed and are negative.   Physical Exam Updated Vital Signs BP (!) 155/62   Pulse 65   Temp 98.8 F (37.1 C) (Oral)   Resp 15   Ht '5\' 3"'$  (1.6 m)   Wt 68 kg   SpO2 96%   BMI 26.57 kg/m   Physical Exam Vitals and nursing note reviewed.  Constitutional:      General: She is not in acute distress.    Appearance: She is well-developed.     Comments: Resting in the bed in NAD  HENT:     Head: Normocephalic and atraumatic.  Eyes:     Conjunctiva/sclera:  Conjunctivae normal.     Pupils: Pupils are equal, round, and reactive to light.  Cardiovascular:     Rate and Rhythm: Normal rate and regular rhythm.     Pulses: Normal pulses.  Pulmonary:     Effort: Pulmonary effort is normal. No respiratory distress.     Breath sounds: Normal breath sounds. No wheezing.  Abdominal:     General: There is no distension.     Palpations: Abdomen is soft. There is no mass.  Tenderness: There is no abdominal tenderness. There is no guarding or rebound.       Comments: Very minimal tenderness palpation of right lower ribs.  No rash noted.  No pain of the anterior abdomen.  No rigidity, guarding, distention.  Negative rebound.  Musculoskeletal:        General: Tenderness present. Normal range of motion.     Cervical back: Normal range of motion and neck supple.     Comments: Mild tenderness palpation of the of the entire left shoulder.  No erythema, warmth, or swelling.  No pain over midline C-spine.  Pain mostly over the musculature. No tenderness palpation of back or midline spine.  Skin:    General: Skin is warm and dry.     Capillary Refill: Capillary refill takes less than 2 seconds.  Neurological:     Mental Status: She is alert and oriented to person, place, and time.     ED Results / Procedures / Treatments   Labs (all labs ordered are listed, but only abnormal results are displayed) Labs Reviewed  CBC WITH DIFFERENTIAL/PLATELET - Abnormal; Notable for the following components:      Result Value   WBC 16.7 (*)    RBC 3.74 (*)    Hemoglobin 10.7 (*)    HCT 30.6 (*)    RDW 15.9 (*)    nRBC 0.8 (*)    Neutro Abs 12.8 (*)    Monocytes Absolute 1.4 (*)    All other components within normal limits  RETICULOCYTES - Abnormal; Notable for the following components:   Retic Ct Pct 3.3 (*)    RBC. 3.65 (*)    Immature Retic Fract 29.6 (*)    All other components within normal limits  COMPREHENSIVE METABOLIC PANEL - Abnormal; Notable for the  following components:   CO2 21 (*)    Glucose, Bld 204 (*)    BUN 35 (*)    Creatinine, Ser 1.54 (*)    Calcium 10.4 (*)    Total Protein 8.5 (*)    GFR, Estimated 36 (*)    All other components within normal limits  URINALYSIS, ROUTINE W REFLEX MICROSCOPIC - Abnormal; Notable for the following components:   Protein, ur 100 (*)    All other components within normal limits    EKG EKG Interpretation  Date/Time:  Saturday March 30 2021 10:08:07 EDT Ventricular Rate:  66 PR Interval:  207 QRS Duration: 95 QT Interval:  377 QTC Calculation: 395 R Axis:   68 Text Interpretation: Sinus rhythm LAE, consider biatrial enlargement Probable anteroseptal infarct, old Nonspecific T abnormalities, lateral leads Confirmed by Dene Gentry [phone]) on 03/30/2021 10:30:47 AM   Radiology DG Chest 2 View  Result Date: 03/30/2021 CLINICAL DATA:  Right-sided chest pain.  Sickle cell crisis. EXAM: CHEST - 2 VIEW COMPARISON:  None. FINDINGS: The heart size and mediastinal contours are within normal limits. Both lungs are clear. The visualized skeletal structures are unremarkable. IMPRESSION: No active cardiopulmonary disease. Electronically Signed   By: Dorise Bullion III M.D   On: 03/30/2021 11:25    Procedures Procedures   Medications Ordered in ED Medications  0.45 % sodium chloride infusion ( Intravenous New Bag/Given 03/30/21 1118)  morphine 4 MG/ML injection 4 mg (4 mg Intravenous Given 03/30/21 1323)    ED Course  I have reviewed the triage vital signs and the nursing notes.  Pertinent labs & imaging results that were available during my care of the patient were reviewed by me  and considered in my medical decision making (see chart for details).    MDM Rules/Calculators/A&P                          Patient presented for evaluation of right side pain and left shoulder pain.  On exam, she appears nontoxic.  No anterior abdominal pain.  She is status post cholecystectomy and  appendectomy.  As she has pain on the right side, will check urine to ensure no infection or kidney stone.  Will obtain x-ray to ensure no lower lobe abnormality.  X-ray will also take a look at the left shoulder to ensure no acute bony abnormality.  Patient states this feels typical of her sickle cell pain crises, will treat for pain and reassess.  On reevaluation, patient reports pain is much improved after 2 out of 3 doses.  Will give third dose and reassess.  Labs overall reassuring.  Mild leukocytosis, nonspecific.  Without fever, doubt infectious etiology.  Urine without signs of infection.  Chest x-ray viewed and independently interpreted by myself, no pneumonia pneumothorax or effusion.  No obvious bony abnormality of the left shoulder.  On reassessment, patient reports pain is much improved.  She feels she is able to manage her pain at home at this point.  I encourage close follow-up with her primary care doctor.  At this time, patient appears safe for discharge.  Return precautions given.  Patient states she understands and agrees to plan.  Final Clinical Impression(s) / ED Diagnoses Final diagnoses:  Sickle cell pain crisis ([place])  Acute pain of left shoulder  Right flank pain    Rx / DC Orders ED Discharge Orders    None       Franchot Heidelberg, PA-C 03/30/21 1443    Valarie Merino, MD 04/03/21 1840

## 2021-04-30 ENCOUNTER — Other Ambulatory Visit (HOSPITAL_COMMUNITY): Payer: Self-pay | Admitting: Pulmonary Disease

## 2021-04-30 ENCOUNTER — Other Ambulatory Visit: Payer: Self-pay | Admitting: Pulmonary Disease

## 2021-04-30 DIAGNOSIS — R42 Dizziness and giddiness: Secondary | ICD-10-CM

## 2021-05-03 ENCOUNTER — Ambulatory Visit (HOSPITAL_COMMUNITY)
Admission: RE | Admit: 2021-05-03 | Discharge: 2021-05-03 | Disposition: A | Discharge status: Home / Self Care | Payer: 59 | Source: Ambulatory Visit | Attending: Pulmonary Disease | Admitting: Pulmonary Disease

## 2021-05-03 ENCOUNTER — Other Ambulatory Visit: Payer: Self-pay

## 2021-05-03 ENCOUNTER — Encounter (HOSPITAL_COMMUNITY): Payer: Self-pay

## 2021-05-03 DIAGNOSIS — R42 Dizziness and giddiness: Secondary | ICD-10-CM | POA: Diagnosis not present

## 2021-05-08 ENCOUNTER — Emergency Department (HOSPITAL_COMMUNITY): Payer: 59

## 2021-05-08 ENCOUNTER — Emergency Department (HOSPITAL_COMMUNITY)
Admission: EM | Admit: 2021-05-08 | Discharge: 2021-05-08 | Disposition: A | Discharge status: Home / Self Care | Payer: 59 | Source: Home / Self Care | Attending: Emergency Medicine | Admitting: Emergency Medicine

## 2021-05-08 ENCOUNTER — Encounter (HOSPITAL_COMMUNITY): Payer: Self-pay | Admitting: *Deleted

## 2021-05-08 DIAGNOSIS — Z794 Long term (current) use of insulin: Secondary | ICD-10-CM | POA: Insufficient documentation

## 2021-05-08 DIAGNOSIS — Z87891 Personal history of nicotine dependence: Secondary | ICD-10-CM | POA: Insufficient documentation

## 2021-05-08 DIAGNOSIS — I2510 Atherosclerotic heart disease of native coronary artery without angina pectoris: Secondary | ICD-10-CM | POA: Insufficient documentation

## 2021-05-08 DIAGNOSIS — D5700 Hb-SS disease with crisis, unspecified: Secondary | ICD-10-CM

## 2021-05-08 DIAGNOSIS — D57219 Sickle-cell/Hb-C disease with crisis, unspecified: Secondary | ICD-10-CM | POA: Diagnosis not present

## 2021-05-08 DIAGNOSIS — E113593 Type 2 diabetes mellitus with proliferative diabetic retinopathy without macular edema, bilateral: Secondary | ICD-10-CM | POA: Insufficient documentation

## 2021-05-08 DIAGNOSIS — Z79899 Other long term (current) drug therapy: Secondary | ICD-10-CM | POA: Insufficient documentation

## 2021-05-08 DIAGNOSIS — Z7984 Long term (current) use of oral hypoglycemic drugs: Secondary | ICD-10-CM | POA: Insufficient documentation

## 2021-05-08 DIAGNOSIS — I129 Hypertensive chronic kidney disease with stage 1 through stage 4 chronic kidney disease, or unspecified chronic kidney disease: Secondary | ICD-10-CM | POA: Insufficient documentation

## 2021-05-08 DIAGNOSIS — Z7982 Long term (current) use of aspirin: Secondary | ICD-10-CM | POA: Insufficient documentation

## 2021-05-08 DIAGNOSIS — N184 Chronic kidney disease, stage 4 (severe): Secondary | ICD-10-CM | POA: Insufficient documentation

## 2021-05-08 DIAGNOSIS — E039 Hypothyroidism, unspecified: Secondary | ICD-10-CM | POA: Insufficient documentation

## 2021-05-08 LAB — COMPREHENSIVE METABOLIC PANEL
ALT: 21 U/L (ref 0–44)
AST: 21 U/L (ref 15–41)
Albumin: 4.5 g/dL (ref 3.5–5.0)
Alkaline Phosphatase: 117 U/L (ref 38–126)
Anion gap: 9 (ref 5–15)
BUN: 28 mg/dL — ABNORMAL HIGH (ref 8–23)
CO2: 22 mmol/L (ref 22–32)
Calcium: 10.1 mg/dL (ref 8.9–10.3)
Chloride: 108 mmol/L (ref 98–111)
Creatinine, Ser: 1.45 mg/dL — ABNORMAL HIGH (ref 0.44–1.00)
GFR, Estimated: 38 mL/min — ABNORMAL LOW (ref >60)
Glucose, Bld: 225 mg/dL — ABNORMAL HIGH (ref 70–99)
Potassium: 4 mmol/L (ref 3.5–5.1)
Sodium: 139 mmol/L (ref 135–145)
Total Bilirubin: 1.3 mg/dL — ABNORMAL HIGH (ref 0.3–1.2)
Total Protein: 8.6 g/dL — ABNORMAL HIGH (ref 6.5–8.1)

## 2021-05-08 LAB — CBC WITH DIFFERENTIAL/PLATELET
Abs Immature Granulocytes: 0.11 10*3/uL — ABNORMAL HIGH (ref 0.00–0.07)
Basophils Absolute: 0 10*3/uL (ref 0.0–0.1)
Basophils Relative: 0 %
Eosinophils Absolute: 0 10*3/uL (ref 0.0–0.5)
Eosinophils Relative: 0 %
HCT: 30.9 % — ABNORMAL LOW (ref 36.0–46.0)
Hemoglobin: 11.1 g/dL — ABNORMAL LOW (ref 12.0–15.0)
Immature Granulocytes: 1 %
Lymphocytes Relative: 10 %
Lymphs Abs: 1.6 10*3/uL (ref 0.7–4.0)
MCH: 29.1 pg (ref 26.0–34.0)
MCHC: 35.9 g/dL (ref 30.0–36.0)
MCV: 81.1 fL (ref 80.0–100.0)
Monocytes Absolute: 1 10*3/uL (ref 0.1–1.0)
Monocytes Relative: 6 %
Neutro Abs: 13.6 10*3/uL — ABNORMAL HIGH (ref 1.7–7.7)
Neutrophils Relative %: 83 %
Platelets: 275 10*3/uL (ref 150–400)
RBC: 3.81 MIL/uL — ABNORMAL LOW (ref 3.87–5.11)
RDW: 15.5 % (ref 11.5–15.5)
WBC: 16.3 10*3/uL — ABNORMAL HIGH (ref 4.0–10.5)
nRBC: 0.7 % — ABNORMAL HIGH (ref 0.0–0.2)

## 2021-05-08 LAB — RETICULOCYTES
Immature Retic Fract: 27.8 % — ABNORMAL HIGH (ref 2.3–15.9)
RBC.: 3.77 MIL/uL — ABNORMAL LOW (ref 3.87–5.11)
Retic Count, Absolute: 130.8 10*3/uL (ref 19.0–186.0)
Retic Ct Pct: 3.5 % — ABNORMAL HIGH (ref 0.4–3.1)

## 2021-05-08 LAB — TROPONIN I (HIGH SENSITIVITY): Troponin I (High Sensitivity): 3 ng/L (ref <18)

## 2021-05-08 MED ORDER — DIPHENHYDRAMINE HCL 25 MG PO CAPS: 25.0000 mg | ORAL_CAPSULE | ORAL | Status: DC | PRN

## 2021-05-08 MED ORDER — HYDROMORPHONE HCL 1 MG/ML IJ SOLN
0.5000 mg | INTRAMUSCULAR | Status: AC
Start: 2021-05-08 — End: 2021-05-08
  Administered 2021-05-08: 0.5 mg via INTRAVENOUS
  Filled 2021-05-08: qty 1

## 2021-05-08 MED ORDER — HYDROMORPHONE HCL 1 MG/ML IJ SOLN
1.0000 mg | INTRAMUSCULAR | Status: AC
Start: 2021-05-08 — End: 2021-05-08
  Administered 2021-05-08: 1 mg via INTRAVENOUS
  Filled 2021-05-08: qty 1

## 2021-05-08 MED ORDER — ONDANSETRON HCL 4 MG/2ML IJ SOLN: 4.0000 mg | INTRAMUSCULAR | Status: DC | PRN

## 2021-05-08 NOTE — ED Provider Notes (Signed)
[facility] DEPT Provider Note   CSN: [id number] Arrival date & time: 05/08/21  1447     History Chief Complaint  Patient presents with  . Sickle Cell Pain Crisis    [name] is a 75 y.o. female.  The history is provided by the patient and medical records. No language interpreter was used.  Sickle Cell Pain Crisis    75 year old female significant history of sickle cell disease, diabetes, hypertension, CKD who presents with complaints of extremity pain.  Patient report she developed pain to her left lower extremity earlier this morning, cellulitis she also notes pain to her left shoulder, radiates towards the chest and back.  Pain is sharp and achy felt similar to prior sickle cell crisis that she has several weeks ago.  She took her home medication without relief.  When asked if she has had any nausea or dizziness shortness of breath fever or chills patient report that she has had some dizziness intermittently for the past few weeks.  States that she was seen and evaluated for her symptoms including MRI of her brain that was unremarkable.  She denies any significant cardiac history denies tobacco or alcohol use.  She is unsure of any provocation causing her pain.  She denies any cold symptoms such as fever cough loss of taste or smell  Past Medical History:  Diagnosis Date  . Abdominal pain   . Coronary artery disease   . Diabetes mellitus without complication ([place])   . Glaucoma   . Hypertension   . Hypothyroid   . Renal insufficiency 09/01/2017  . Sickle cell anemia [facility])     Patient Active Problem List   Diagnosis Date Noted  . 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 Right epiretinal membrane 09/27/2020  . Controlled type 2 diabetes mellitus with stable proliferative retinopathy of both eyes, with long-term current use of insulin ([place]) 09/27/2020  . Primary open angle glaucoma of both eyes, moderate stage 09/27/2020  . Sickle  cell pain crisis ([place]) 06/11/2019  . Acute renal failure superimposed on stage 4 chronic kidney disease ([place])   . Chronic pain syndrome   . Hemoglobin s-c disease, with acute chest syndrome ([place]) 09/01/2017  . DM2 (diabetes mellitus, type 2) ([place]) 09/01/2017  . HTN (hypertension) 09/01/2017  . CKD (chronic kidney disease), stage III ([place]) 09/01/2017  . Acute lower UTI 09/01/2017    Past Surgical History:  Procedure Laterality Date  . CHOLECYSTECTOMY    . HIP SURGERY     6 yrs ago  . REFRACTIVE SURGERY    . RETINAL DETACHMENT SURGERY       OB History   No obstetric history on file.     Family History  Problem Relation Age of Onset  . Diabetes Mother   . Cancer Father        Prostate  . Diabetes Father   . Cancer Brother   . Diabetes Brother   . Breast cancer Maternal Aunt        does not know age    Social History   Tobacco Use  . Smoking status: Former Research scientist (life sciences)  . Smokeless tobacco: Never Used  Vaping Use  . Vaping Use: Never used  Substance Use Topics  . Alcohol use: No  . Drug use: No    Home Medications Prior to Admission medications   Medication Sig Start Date End Date Taking? Authorizing Provider  acetaminophen (TYLENOL) 500 MG tablet Take 1,000 mg by mouth every  6 (six) hours as needed for mild pain or moderate pain.     [provider]  amLODipine (NORVASC) 10 MG tablet Take 10 mg by mouth daily.    [provider]  aspirin EC 81 MG tablet Take 81 mg by mouth daily.    [provider]  atorvastatin (LIPITOR) 40 MG tablet Take 40 mg by mouth at bedtime.     [provider]  Calcium Carb-Cholecalciferol 600-800 MG-UNIT TABS Take 1 tablet by mouth 2 (two) times daily.    [provider]  cycloSPORINE (RESTASIS) 0.05 % ophthalmic emulsion Place 1 drop into both eyes 2 (two) times daily.    [provider]  ferrous sulfate 325 (65 FE) MG tablet Take 325 mg by mouth daily with breakfast.    [provider]  folic acid (FOLVITE) 1 MG tablet Take 1 mg by mouth daily.    [provider]  glipiZIDE (GLUCOTROL XL) 5 MG 24 hr tablet Take 5 mg by mouth 3 (three) times a week. Mon, Wed, Fri    [provider]  insulin glargine (LANTUS) 100 UNIT/ML injection Inject 0.18 mLs (18 Units total) into the skin at bedtime. 09/02/17   Leana Gamer, MD  levothyroxine (SYNTHROID) 175 MCG tablet Take 175 mcg by mouth daily before breakfast.    [provider]  losartan (COZAAR) 100 MG tablet Take 100 mg by mouth daily.    Vincente Liberty, MD  LUMIGAN 0.01 % SOLN Place 1 drop into both eyes at bedtime. Separate by at least 10 minutes from other intraocular pressure reducing ophthalmic drugs 06/06/19   [provider]  metoprolol succinate (TOPROL-XL) 50 MG 24 hr tablet Take 50 mg by mouth daily. Take with or immediately following a meal.    [provider]  oxyCODONE 10 MG TABS Take 1 tablet (10 mg total) by mouth every 6 (six) hours as needed for severe pain. Patient not taking: Reported on 03/30/2021 06/16/19   Dorena Dew, FNP  RHOPRESSA 0.02 % SOLN Place 1 drop into the left eye at bedtime. 02/16/21   [provider]  spironolactone (ALDACTONE) 25 MG tablet Take 25 mg by mouth daily. 05/23/19   [provider]  vitamin C (ASCORBIC ACID) 500 MG tablet Take 500 mg by mouth daily.    [provider]  Vitamin D, Ergocalciferol, (DRISDOL) 1.25 MG (50000 UNIT) CAPS capsule Take 50,000 Units by mouth every 7 (seven) days. Monday    [provider]    Allergies    Bee venom and Nsaids  Review of Systems   Review of Systems  All other systems reviewed and are negative.   Physical Exam Updated Vital Signs BP (!) 158/65 (BP Location: Left Arm)   Pulse 60   Temp (!) 97.5 F (36.4 C) (Oral)   Resp 18   SpO2 100%   Physical Exam Vitals and nursing note reviewed.  Constitutional:      General: She is not in  acute distress.    Appearance: She is well-developed.  HENT:     Head: Atraumatic.  Eyes:     Conjunctiva/sclera: Conjunctivae normal.  Cardiovascular:     Rate and Rhythm: Normal rate and regular rhythm.     Pulses: Normal pulses.     Heart sounds: Normal heart sounds.  Pulmonary:     Effort: Pulmonary effort is normal.     Breath sounds: Normal breath sounds.  Abdominal:     Palpations: Abdomen  is soft.     Tenderness: There is no abdominal tenderness.  Musculoskeletal:     Cervical back: Neck supple.     Right lower leg: No edema.     Left lower leg: No edema.  Skin:    Findings: No rash.  Neurological:     Mental Status: She is alert and oriented to person, place, and time.  Psychiatric:        Mood and Affect: Mood normal.     ED Results / Procedures / Treatments   Labs (all labs ordered are listed, but only abnormal results are displayed) Labs Reviewed  COMPREHENSIVE METABOLIC PANEL - Abnormal; Notable for the following components:      Result Value   Glucose, Bld 225 (*)    BUN 28 (*)    Creatinine, Ser 1.45 (*)    Total Protein 8.6 (*)    Total Bilirubin 1.3 (*)    GFR, Estimated 38 (*)    All other components within normal limits  CBC WITH DIFFERENTIAL/PLATELET - Abnormal; Notable for the following components:   WBC 16.3 (*)    RBC 3.81 (*)    Hemoglobin 11.1 (*)    HCT 30.9 (*)    nRBC 0.7 (*)    Neutro Abs 13.6 (*)    Abs Immature Granulocytes 0.11 (*)    All other components within normal limits  RETICULOCYTES - Abnormal; Notable for the following components:   Retic Ct Pct 3.5 (*)    RBC. 3.77 (*)    Immature Retic Fract 27.8 (*)    All other components within normal limits  TROPONIN I (HIGH SENSITIVITY)    EKG EKG Interpretation  Date/Time:  Wednesday May 08 2021 16:30:31 EDT Ventricular Rate:  71 PR Interval:  206 QRS Duration: 109 QT Interval:  377 QTC Calculation: 410 R Axis:   71 Text Interpretation: Sinus rhythm Left atrial  enlargement Anteroseptal infarct, old Borderline repolarization abnormality since last tracing no significant change Confirmed by Daleen Bo [phone]) on 05/08/2021 4:44:25 PM   Radiology DG Chest Portable 1 View  Result Date: 05/08/2021 CLINICAL DATA:  Chest pain, reports sickle cell crisis. EXAM: PORTABLE CHEST 1 VIEW COMPARISON:  Radiograph 03/30/2021, chest CT 06/23/2016 FINDINGS: The heart size and mediastinal contours are within normal limits. No focal airspace disease. No pleural effusion or pneumothorax. 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 IMPRESSION: No evidence of acute cardiopulmonary disease. Electronically Signed   By: Maurine Simmering   On: 05/08/2021 16:47    Procedures Procedures   Medications Ordered in ED Medications - No data to display  ED Course  I have reviewed the triage vital signs and the nursing notes.  Pertinent labs & imaging results that were available during my care of the patient were reviewed by me and considered in my medical decision making (see chart for details).    MDM Rules/Calculators/A&P                          BP (!) 158/59   Pulse 71   Temp (!) 97.5 F (36.4 C) (Oral)   Resp 17   SpO2 92%   Final Clinical Impression(s) / ED Diagnoses Final diagnoses:  Sickle cell anemia with pain ([place])    Rx / DC Orders ED Discharge Orders    None     3:47 PM Patient report pain to her leg and arm similar to prior sickle cell crisis, and also with  pain radiates to chest and back.  Pain is atypical of ACS likely related her to her sickle cell disease as it felt similar to prior crisis.  Work-up initiated, will treat symptoms. Care discussed with DR. Eulis Foster.   5:12 PM Labs mostly at baseline.  No significant concerning changes on EKG, troponin is negative, chest x-ray unremarkable.  Patient is currently receiving medication for sickle cell crisis.  7:05 PM Patient reports feeling much better after receiving medication to  treat her pain.  No evidence of acute chest.  She is stable for discharge.  She may resume taking her home medication and follow-up outpatient for further care.   Domenic Moras, PA-C 05/08/21 Einar Crow    Daleen Bo, MD 05/09/21 1134

## 2021-05-08 NOTE — ED Provider Notes (Signed)
  Face-to-face evaluation   History: Patient presents for suspected recurrent sickle cell crisis, similar episode about a month ago.  She has hemoglobin Bowlegs disease.  She has previously been on chronic narcotic analgesia but is not currently taking narcotics daily.  She has used 2 hydrocodone tablets in the last month.  Her pain started this morning she has not taken any narcotics at home today.  She denies fever, cough, focal weakness paresthesia.  Physical exam: Elderly, alert and cooperative.  No respiratory distress.  Heart regular rate and rhythm.  Lungs clear anteriorly.  Abdomen soft and nontender.  Medical screening examination/treatment/procedure(s) were conducted as a shared visit with non-physician practitioner(s) and myself.  I personally evaluated the patient during the encounter    Daleen Bo, MD 05/09/21 1134

## 2021-05-08 NOTE — Discharge Instructions (Signed)
You have been treated for your sickle cell related pain.  Please continue taking your home medication and follow-up closely with your doctor for further care.  Return if you have any concern.

## 2021-05-08 NOTE — ED Triage Notes (Signed)
Pt reports sickle cell pain crisis. Reports pain is traveling all over body.

## 2021-05-09 ENCOUNTER — Emergency Department (HOSPITAL_COMMUNITY): Payer: 59

## 2021-05-09 ENCOUNTER — Inpatient Hospital Stay (HOSPITAL_COMMUNITY)
Admission: EM | Admit: 2021-05-09 | Discharge: 2021-05-15 | DRG: 812 | Disposition: A | Discharge status: Home / Self Care | Payer: 59 | Attending: Internal Medicine | Admitting: Internal Medicine

## 2021-05-09 ENCOUNTER — Other Ambulatory Visit: Payer: Self-pay

## 2021-05-09 ENCOUNTER — Encounter (HOSPITAL_COMMUNITY): Payer: Self-pay

## 2021-05-09 DIAGNOSIS — D57219 Sickle-cell/Hb-C disease with crisis, unspecified: Principal | ICD-10-CM | POA: Diagnosis present

## 2021-05-09 DIAGNOSIS — N179 Acute kidney failure, unspecified: Secondary | ICD-10-CM | POA: Diagnosis present

## 2021-05-09 DIAGNOSIS — E039 Hypothyroidism, unspecified: Secondary | ICD-10-CM | POA: Diagnosis present

## 2021-05-09 DIAGNOSIS — Z79899 Other long term (current) drug therapy: Secondary | ICD-10-CM

## 2021-05-09 DIAGNOSIS — Z79891 Long term (current) use of opiate analgesic: Secondary | ICD-10-CM

## 2021-05-09 DIAGNOSIS — E119 Type 2 diabetes mellitus without complications: Secondary | ICD-10-CM | POA: Diagnosis not present

## 2021-05-09 DIAGNOSIS — D72829 Elevated white blood cell count, unspecified: Secondary | ICD-10-CM | POA: Diagnosis present

## 2021-05-09 DIAGNOSIS — G894 Chronic pain syndrome: Secondary | ICD-10-CM | POA: Diagnosis present

## 2021-05-09 DIAGNOSIS — I10 Essential (primary) hypertension: Secondary | ICD-10-CM | POA: Diagnosis not present

## 2021-05-09 DIAGNOSIS — Z79890 Hormone replacement therapy (postmenopausal): Secondary | ICD-10-CM

## 2021-05-09 DIAGNOSIS — I129 Hypertensive chronic kidney disease with stage 1 through stage 4 chronic kidney disease, or unspecified chronic kidney disease: Secondary | ICD-10-CM | POA: Diagnosis present

## 2021-05-09 DIAGNOSIS — E860 Dehydration: Secondary | ICD-10-CM | POA: Diagnosis present

## 2021-05-09 DIAGNOSIS — N183 Chronic kidney disease, stage 3 unspecified: Secondary | ICD-10-CM | POA: Diagnosis present

## 2021-05-09 DIAGNOSIS — Z7982 Long term (current) use of aspirin: Secondary | ICD-10-CM

## 2021-05-09 DIAGNOSIS — N1832 Chronic kidney disease, stage 3b: Secondary | ICD-10-CM | POA: Diagnosis present

## 2021-05-09 DIAGNOSIS — Z794 Long term (current) use of insulin: Secondary | ICD-10-CM

## 2021-05-09 DIAGNOSIS — Z7984 Long term (current) use of oral hypoglycemic drugs: Secondary | ICD-10-CM

## 2021-05-09 DIAGNOSIS — E1122 Type 2 diabetes mellitus with diabetic chronic kidney disease: Secondary | ICD-10-CM | POA: Diagnosis present

## 2021-05-09 DIAGNOSIS — E1165 Type 2 diabetes mellitus with hyperglycemia: Secondary | ICD-10-CM | POA: Diagnosis present

## 2021-05-09 DIAGNOSIS — H409 Unspecified glaucoma: Secondary | ICD-10-CM | POA: Diagnosis present

## 2021-05-09 DIAGNOSIS — Z91030 Bee allergy status: Secondary | ICD-10-CM

## 2021-05-09 DIAGNOSIS — I2510 Atherosclerotic heart disease of native coronary artery without angina pectoris: Secondary | ICD-10-CM | POA: Diagnosis present

## 2021-05-09 DIAGNOSIS — Z886 Allergy status to analgesic agent status: Secondary | ICD-10-CM

## 2021-05-09 DIAGNOSIS — Z20822 Contact with and (suspected) exposure to covid-19: Secondary | ICD-10-CM | POA: Diagnosis present

## 2021-05-09 DIAGNOSIS — J90 Pleural effusion, not elsewhere classified: Secondary | ICD-10-CM | POA: Diagnosis present

## 2021-05-09 DIAGNOSIS — D5700 Hb-SS disease with crisis, unspecified: Secondary | ICD-10-CM | POA: Diagnosis not present

## 2021-05-09 DIAGNOSIS — Z833 Family history of diabetes mellitus: Secondary | ICD-10-CM

## 2021-05-09 DIAGNOSIS — R079 Chest pain, unspecified: Secondary | ICD-10-CM

## 2021-05-09 DIAGNOSIS — Z87891 Personal history of nicotine dependence: Secondary | ICD-10-CM

## 2021-05-09 DIAGNOSIS — R509 Fever, unspecified: Secondary | ICD-10-CM

## 2021-05-09 LAB — TROPONIN I (HIGH SENSITIVITY)
Troponin I (High Sensitivity): 5 ng/L (ref <18)
Troponin I (High Sensitivity): 5 ng/L (ref <18)

## 2021-05-09 LAB — COMPREHENSIVE METABOLIC PANEL
ALT: 23 U/L (ref 0–44)
AST: 22 U/L (ref 15–41)
Albumin: 4.5 g/dL (ref 3.5–5.0)
Alkaline Phosphatase: 129 U/L — ABNORMAL HIGH (ref 38–126)
Anion gap: 10 (ref 5–15)
BUN: 29 mg/dL — ABNORMAL HIGH (ref 8–23)
CO2: 22 mmol/L (ref 22–32)
Calcium: 10 mg/dL (ref 8.9–10.3)
Chloride: 104 mmol/L (ref 98–111)
Creatinine, Ser: 1.67 mg/dL — ABNORMAL HIGH (ref 0.44–1.00)
GFR, Estimated: 32 mL/min — ABNORMAL LOW (ref >60)
Glucose, Bld: 387 mg/dL — ABNORMAL HIGH (ref 70–99)
Potassium: 4.4 mmol/L (ref 3.5–5.1)
Sodium: 136 mmol/L (ref 135–145)
Total Bilirubin: 1.3 mg/dL — ABNORMAL HIGH (ref 0.3–1.2)
Total Protein: 8.6 g/dL — ABNORMAL HIGH (ref 6.5–8.1)

## 2021-05-09 LAB — CBC WITH DIFFERENTIAL/PLATELET
Abs Immature Granulocytes: 0.11 10*3/uL — ABNORMAL HIGH (ref 0.00–0.07)
Basophils Absolute: 0 10*3/uL (ref 0.0–0.1)
Basophils Relative: 0 %
Eosinophils Absolute: 0 10*3/uL (ref 0.0–0.5)
Eosinophils Relative: 0 %
HCT: 29.5 % — ABNORMAL LOW (ref 36.0–46.0)
Hemoglobin: 10.6 g/dL — ABNORMAL LOW (ref 12.0–15.0)
Immature Granulocytes: 1 %
Lymphocytes Relative: 7 %
Lymphs Abs: 1.3 10*3/uL (ref 0.7–4.0)
MCH: 29 pg (ref 26.0–34.0)
MCHC: 35.9 g/dL (ref 30.0–36.0)
MCV: 80.6 fL (ref 80.0–100.0)
Monocytes Absolute: 1.5 10*3/uL — ABNORMAL HIGH (ref 0.1–1.0)
Monocytes Relative: 8 %
Neutro Abs: 15.4 10*3/uL — ABNORMAL HIGH (ref 1.7–7.7)
Neutrophils Relative %: 84 %
Platelets: 264 10*3/uL (ref 150–400)
RBC: 3.66 MIL/uL — ABNORMAL LOW (ref 3.87–5.11)
RDW: 15.5 % (ref 11.5–15.5)
WBC: 18.3 10*3/uL — ABNORMAL HIGH (ref 4.0–10.5)
nRBC: 0.6 % — ABNORMAL HIGH (ref 0.0–0.2)

## 2021-05-09 LAB — RETICULOCYTES
Immature Retic Fract: 28.6 % — ABNORMAL HIGH (ref 2.3–15.9)
RBC.: 3.67 MIL/uL — ABNORMAL LOW (ref 3.87–5.11)
Retic Count, Absolute: 121.5 10*3/uL (ref 19.0–186.0)
Retic Ct Pct: 3.3 % — ABNORMAL HIGH (ref 0.4–3.1)

## 2021-05-09 MED ORDER — LEVOTHYROXINE SODIUM 50 MCG PO TABS
175.0000 ug | ORAL_TABLET | Freq: Every day | ORAL | Status: DC
  Administered 2021-05-10 – 2021-05-15 (×6): 175 ug via ORAL
  Filled 2021-05-09 (×7): qty 1

## 2021-05-09 MED ORDER — NETARSUDIL DIMESYLATE 0.02 % OP SOLN: 1.0000 [drp] | Freq: Every day | OPHTHALMIC | Status: DC

## 2021-05-09 MED ORDER — NALOXONE HCL 0.4 MG/ML IJ SOLN: 0.4000 mg | INTRAMUSCULAR | Status: DC | PRN

## 2021-05-09 MED ORDER — MORPHINE SULFATE (PF) 4 MG/ML IV SOLN
8.0000 mg | INTRAVENOUS | Status: AC
  Administered 2021-05-09: 8 mg via INTRAVENOUS
  Filled 2021-05-09: qty 2

## 2021-05-09 MED ORDER — ACETAMINOPHEN 650 MG RE SUPP: 650.0000 mg | Freq: Four times a day (QID) | RECTAL | Status: DC | PRN

## 2021-05-09 MED ORDER — ONDANSETRON HCL 4 MG/2ML IJ SOLN
4.0000 mg | INTRAMUSCULAR | Status: DC | PRN
  Administered 2021-05-09: 4 mg via INTRAVENOUS
  Filled 2021-05-09: qty 2

## 2021-05-09 MED ORDER — SODIUM CHLORIDE 0.9 % IV SOLN: INTRAVENOUS | Status: DC

## 2021-05-09 MED ORDER — ACETAMINOPHEN 325 MG PO TABS
650.0000 mg | ORAL_TABLET | Freq: Four times a day (QID) | ORAL | Status: DC | PRN
  Administered 2021-05-11: 650 mg via ORAL
  Filled 2021-05-09: qty 2

## 2021-05-09 MED ORDER — SODIUM CHLORIDE 0.45 % IV SOLN: INTRAVENOUS | Status: AC

## 2021-05-09 MED ORDER — ATORVASTATIN CALCIUM 40 MG PO TABS
40.0000 mg | ORAL_TABLET | Freq: Every day | ORAL | Status: DC
  Administered 2021-05-10 – 2021-05-14 (×6): 40 mg via ORAL
  Filled 2021-05-09 (×6): qty 1

## 2021-05-09 MED ORDER — INSULIN ASPART 100 UNIT/ML IJ SOLN
0.0000 [IU] | Freq: Three times a day (TID) | INTRAMUSCULAR | Status: DC
  Administered 2021-05-10: 3 [IU] via SUBCUTANEOUS
  Administered 2021-05-10 (×2): 2 [IU] via SUBCUTANEOUS
  Administered 2021-05-11: 3 [IU] via SUBCUTANEOUS
  Administered 2021-05-11 (×2): 5 [IU] via SUBCUTANEOUS
  Administered 2021-05-12 (×3): 2 [IU] via SUBCUTANEOUS
  Administered 2021-05-13: 7 [IU] via SUBCUTANEOUS
  Administered 2021-05-13: 1 [IU] via SUBCUTANEOUS
  Administered 2021-05-13: 3 [IU] via SUBCUTANEOUS
  Administered 2021-05-14: 2 [IU] via SUBCUTANEOUS
  Administered 2021-05-14 (×2): 5 [IU] via SUBCUTANEOUS
  Administered 2021-05-15: 7 [IU] via SUBCUTANEOUS
  Administered 2021-05-15: 2 [IU] via SUBCUTANEOUS
  Filled 2021-05-09: qty 0.09

## 2021-05-09 MED ORDER — INSULIN GLARGINE 100 UNIT/ML ~~LOC~~ SOLN
7.0000 [IU] | Freq: Every day | SUBCUTANEOUS | Status: DC
  Administered 2021-05-10 – 2021-05-14 (×6): 7 [IU] via SUBCUTANEOUS
  Filled 2021-05-09 (×8): qty 0.07

## 2021-05-09 MED ORDER — CYCLOSPORINE 0.05 % OP EMUL
1.0000 [drp] | Freq: Two times a day (BID) | OPHTHALMIC | Status: DC
  Administered 2021-05-10 – 2021-05-15 (×12): 1 [drp] via OPHTHALMIC
  Filled 2021-05-09 (×12): qty 1

## 2021-05-09 MED ORDER — DEXTROSE-NACL 5-0.45 % IV SOLN: INTRAVENOUS | Status: DC

## 2021-05-09 MED ORDER — AMLODIPINE BESYLATE 10 MG PO TABS
10.0000 mg | ORAL_TABLET | Freq: Every day | ORAL | Status: DC
  Administered 2021-05-10 – 2021-05-15 (×7): 10 mg via ORAL
  Filled 2021-05-09: qty 2
  Filled 2021-05-09 (×3): qty 1
  Filled 2021-05-09: qty 2
  Filled 2021-05-09 (×2): qty 1

## 2021-05-09 MED ORDER — ASPIRIN EC 81 MG PO TBEC
81.0000 mg | DELAYED_RELEASE_TABLET | Freq: Every day | ORAL | Status: DC
  Administered 2021-05-10 – 2021-05-15 (×6): 81 mg via ORAL
  Filled 2021-05-09 (×6): qty 1

## 2021-05-09 MED ORDER — SODIUM CHLORIDE 0.9 % IV SOLN
25.0000 mg | INTRAVENOUS | Status: DC | PRN
  Filled 2021-05-09: qty 0.5

## 2021-05-09 MED ORDER — MORPHINE SULFATE (PF) 4 MG/ML IV SOLN
6.0000 mg | INTRAVENOUS | Status: AC
Start: 2021-05-09 — End: 2021-05-09
  Administered 2021-05-09: 6 mg via INTRAVENOUS
  Filled 2021-05-09: qty 2

## 2021-05-09 MED ORDER — INSULIN ASPART 100 UNIT/ML IJ SOLN
10.0000 [IU] | Freq: Once | INTRAMUSCULAR | Status: AC
  Administered 2021-05-09: 10 [IU] via INTRAVENOUS
  Filled 2021-05-09: qty 0.1

## 2021-05-09 MED ORDER — SODIUM CHLORIDE 0.9% FLUSH: 9.0000 mL | INTRAVENOUS | Status: DC | PRN

## 2021-05-09 MED ORDER — INSULIN ASPART 100 UNIT/ML IJ SOLN
0.0000 [IU] | Freq: Every day | INTRAMUSCULAR | Status: DC
  Administered 2021-05-10: 5 [IU] via SUBCUTANEOUS
  Administered 2021-05-11 – 2021-05-13 (×2): 2 [IU] via SUBCUTANEOUS
  Administered 2021-05-14: 5 [IU] via SUBCUTANEOUS
  Filled 2021-05-09: qty 0.05

## 2021-05-09 MED ORDER — HYDROMORPHONE HCL 1 MG/ML IJ SOLN
1.0000 mg | INTRAMUSCULAR | Status: DC | PRN
  Administered 2021-05-10 (×3): 1 mg via INTRAVENOUS
  Filled 2021-05-09 (×3): qty 1

## 2021-05-09 MED ORDER — METOPROLOL SUCCINATE ER 50 MG PO TB24
50.0000 mg | ORAL_TABLET | Freq: Every day | ORAL | Status: DC
  Administered 2021-05-10 – 2021-05-14 (×7): 50 mg via ORAL
  Filled 2021-05-09 (×7): qty 1

## 2021-05-09 MED ORDER — HYDROMORPHONE 1 MG/ML IV SOLN
INTRAVENOUS | Status: DC
  Administered 2021-05-10: 0 mg via INTRAVENOUS
  Administered 2021-05-10: 30 mg via INTRAVENOUS
  Administered 2021-05-11: 0.5 mg via INTRAVENOUS
  Administered 2021-05-11: 2 mg via INTRAVENOUS
  Administered 2021-05-11: 0.5 mg via INTRAVENOUS
  Filled 2021-05-09: qty 30

## 2021-05-09 MED ORDER — DIPHENHYDRAMINE HCL 25 MG PO CAPS: 25.0000 mg | ORAL_CAPSULE | ORAL | Status: DC | PRN

## 2021-05-09 NOTE — ED Triage Notes (Signed)
Patient c/o sickle cell pain in her chest and radiates into the back since yesterday. Patient states she was seen yesterday for the same. Patient also c/o SOB

## 2021-05-09 NOTE — ED Notes (Signed)
Pt family would like to be notified when pt goes upstair. Number is [phone]

## 2021-05-09 NOTE — ED Provider Notes (Signed)
Patient care assumed at 1500. Patient with history of sickle cell, diabetes, CKD here for sickle cell pain crisis. This is her second ED visit in two days. Plan for reassessment after pain meds.  on reassessment after two rounds of payments her pain is partially improved but she does have ongoing symptoms. Plan to admit for ongoing pain control.   Quintella Reichert, MD 05/09/21 [phone]

## 2021-05-09 NOTE — Progress Notes (Signed)
Brief note regarding preliminary plan, with full H&P to follow:  75 year old female with history of sickle cell disease who presents with 2days bilateral shoulder, back, and chest discomfort in distribution, intensity, and quality similar to that which she has experienced with previous episodes of sickle cell pain crisis.  Presented with these complaints to the emergency department yesterday, with ensuing improvement in pain control following doses of as needed IV analgesics before subsequently being discharged from the ED at home.  However, in the interval, patient reports that her pain in the above distribution has returned, with suboptimal pain control following multiple doses of IV morphine administered in the emergency department today, prompting admission for further evaluation management of sickle cell pain crisis.  I placed an order for Dilaudid PCA, as well as as needed IV Dilaudid to be used until Dilaudid PCA can be started.  I have also placed a pharmacy order asking that this as needed IV Dilaudid order be discontinued upon successful initiation of Dilaudid PCA to prevent redundancy and pain control approach.  No evidence to suggest acute chest syndrome at this time.     Babs Bertin, DO Hospitalist

## 2021-05-09 NOTE — ED Provider Notes (Signed)
[facility] DEPT Provider Note   CSN: [id number] Arrival date & time: 05/09/21  1004     History Chief Complaint  Patient presents with   Sickle Cell Pain Crisis    [name] is a 75 y.o. female.  Patient seen yesterday.  For her sickle cell pain.  Pain is bilateral shoulders anterior chest radiates to the back.  Same pain she had yesterday.  Very similar to her normal sickle cell pain.  Past medical history is significant for coronary artery disease diabetes without complication hypertension hypothyroidism renal insufficiency and sickle cell anemia.  Patient states that her pain really was not any better yesterday when she got home she suffered throughout the evening and throughout the night time.  She is back with the same pain.  She does have pain medicine at home but its not helping.  Patient denies any significant shortness of breath.  No leg swelling.  No fevers.      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   Right epiretinal membrane 09/27/2020   Controlled type 2 diabetes mellitus with stable proliferative retinopathy of both eyes, with long-term current use of insulin ([place]) 09/27/2020   Primary open angle glaucoma of both eyes, moderate stage 09/27/2020   Sickle cell pain crisis ([place]) 06/11/2019   Acute renal failure superimposed on stage 4 chronic kidney disease (HCC)    Chronic pain syndrome    Hemoglobin s-c disease, with acute chest syndrome ([place]) 09/01/2017   DM2 (diabetes mellitus, type 2) ([place]) 09/01/2017   HTN (hypertension) 09/01/2017   CKD (chronic kidney disease), stage III ([place]) 09/01/2017   Acute lower UTI 09/01/2017     Past Surgical History:  Procedure Laterality Date   CHOLECYSTECTOMY     HIP SURGERY     6 yrs ago   [place]       OB History   No obstetric history on file.     Family History  Problem Relation Age of Onset   Diabetes Mother    Cancer Father        Prostate   Diabetes Father    Cancer Brother    Diabetes Brother    Breast cancer Maternal Aunt        does not know age    Social History   Tobacco Use   Smoking status: Former    Pack years: 0.00   Smokeless tobacco: Never  Vaping Use   Vaping Use: Never used  Substance Use Topics   Alcohol use: No   Drug use: No    Home Medications Prior to Admission medications   Medication Sig Start Date End Date Taking? Authorizing Provider  acetaminophen (TYLENOL) 500 MG tablet Take 1,000 mg by mouth every 6 (six) hours as needed for mild pain or moderate pain.     [provider]  amLODipine (NORVASC) 10 MG tablet Take 10 mg by mouth daily.    [provider]  aspirin EC 81 MG tablet Take 81 mg by mouth daily.    [provider]  atorvastatin (LIPITOR) 40 MG  tablet Take 40 mg by mouth at bedtime.     [provider]  Cholecalciferol (VITAMIN D3) 1.25 MG (50000 UT) CAPS Take 1 capsule by mouth once a week. 04/19/21   [provider]  cycloSPORINE (RESTASIS) 0.05 % ophthalmic emulsion Place 1 drop into both eyes 2 (two) times daily.    [provider]  ferrous sulfate 325 (65 FE) MG tablet Take 325 mg by mouth daily with breakfast.    [provider]  folic acid (FOLVITE) 1 MG tablet Take 1 mg by mouth daily.    [provider]  glipiZIDE (GLUCOTROL XL) 5 MG 24 hr tablet Take 5 mg by mouth 3 (three) times a week. Mon, Wed, Fri    [provider]  insulin glargine (LANTUS) 100 UNIT/ML injection Inject 0.18 mLs (18 Units total) into the skin at bedtime. 09/02/17   Leana Gamer, MD  levothyroxine  (SYNTHROID) 175 MCG tablet Take 175 mcg by mouth daily before breakfast.    [provider]  losartan (COZAAR) 100 MG tablet Take 100 mg by mouth daily.    Vincente Liberty, MD  LUMIGAN 0.01 % SOLN Place 1 drop into both eyes at bedtime. Separate by at least 10 minutes from other intraocular pressure reducing ophthalmic drugs 06/06/19   [provider]  meclizine (ANTIVERT) 25 MG tablet Take 25 mg by mouth 3 (three) times daily as needed for dizziness. 04/25/21   [provider]  metoprolol succinate (TOPROL-XL) 50 MG 24 hr tablet Take 50 mg by mouth at bedtime. Take with or immediately following a meal.    [provider]  oxyCODONE 10 MG TABS Take 1 tablet (10 mg total) by mouth every 6 (six) hours as needed for severe pain. Patient not taking: No sig reported 06/16/19   Dorena Dew, FNP  RHOPRESSA 0.02 % SOLN Place 1 drop into the left eye at bedtime. 02/16/21   [provider]  spironolactone (ALDACTONE) 25 MG tablet Take 25 mg by mouth daily. 05/23/19   [provider]  vitamin C (ASCORBIC ACID) 500 MG tablet Take 500 mg by mouth daily.    [provider]    Allergies    Bee venom and Nsaids  Review of Systems   Review of Systems  Constitutional:  Negative for chills and fever.  HENT:  Negative for ear pain and sore throat.   Eyes:  Negative for pain and visual disturbance.  Respiratory:  Negative for cough and shortness of breath.   Cardiovascular:  Positive for chest pain. Negative for palpitations.  Gastrointestinal:  Negative for abdominal pain and vomiting.  Genitourinary:  Negative for dysuria and hematuria.  Musculoskeletal:  Positive for back pain and myalgias. Negative for arthralgias.  Skin:  Negative for color change and rash.  Neurological:  Negative for seizures and syncope.  All other systems reviewed and are negative.  Physical Exam Updated Vital Signs BP (!) 192/63   Pulse 82   Temp 98.5 F (36.9  C) (Oral)   Resp (!) 21   Ht 1.613 m (5' 3.5")   Wt 66.7 kg   SpO2 97%   BMI 25.63 kg/m   Physical Exam Vitals and nursing note reviewed.  Constitutional:      General: She is not in acute distress.    Appearance: Normal appearance. She is well-developed. She is not ill-appearing.  HENT:     Head: Normocephalic and atraumatic.  Eyes:     Extraocular Movements: Extraocular movements  intact.     Conjunctiva/sclera: Conjunctivae normal.     Pupils: Pupils are equal, round, and reactive to light.  Cardiovascular:     Rate and Rhythm: Normal rate and regular rhythm.     Heart sounds: No murmur heard. Pulmonary:     Effort: Pulmonary effort is normal. No respiratory distress.     Breath sounds: Normal breath sounds.  Chest:     Chest wall: No tenderness.  Abdominal:     Palpations: Abdomen is soft.     Tenderness: There is no abdominal tenderness.  Musculoskeletal:        General: No swelling. Normal range of motion.     Cervical back: Normal range of motion and neck supple.  Skin:    General: Skin is warm and dry.     Capillary Refill: Capillary refill takes 2 to 3 seconds.  Neurological:     General: No focal deficit present.     Mental Status: She is alert and oriented to person, place, and time.     Cranial Nerves: No cranial nerve deficit.     Sensory: No sensory deficit.    ED Results / Procedures / Treatments   Labs (all labs ordered are listed, but only abnormal results are displayed) Labs Reviewed  COMPREHENSIVE METABOLIC PANEL - Abnormal; Notable for the following components:      Result Value   Glucose, Bld 387 (*)    BUN 29 (*)    Creatinine, Ser 1.67 (*)    Total Protein 8.6 (*)    Alkaline Phosphatase 129 (*)    Total Bilirubin 1.3 (*)    GFR, Estimated 32 (*)    All other components within normal limits  CBC WITH DIFFERENTIAL/PLATELET - Abnormal; Notable for the following components:   WBC 18.3 (*)    RBC 3.66 (*)    Hemoglobin 10.6 (*)    HCT  29.5 (*)    nRBC 0.6 (*)    Neutro Abs 15.4 (*)    Monocytes Absolute 1.5 (*)    Abs Immature Granulocytes 0.11 (*)    All other components within normal limits  RETICULOCYTES - Abnormal; Notable for the following components:   Retic Ct Pct 3.3 (*)    RBC. 3.67 (*)    Immature Retic Fract 28.6 (*)    All other components within normal limits  TROPONIN I (HIGH SENSITIVITY)  TROPONIN I (HIGH SENSITIVITY)    EKG EKG Interpretation  Date/Time:  Thursday May 09 2021 10:54:53 EDT Ventricular Rate:  85 PR Interval:  176 QRS Duration: 85 QT Interval:  395 QTC Calculation: 470 R Axis:   69 Text Interpretation: Sinus rhythm Atrial premature complexes Probable left atrial enlargement Anterior infarct, old Minimal ST depression, inferior leads 12 Lead; [place] Confirmed by Fredia Sorrow [phone]) on 05/09/2021 11:36:00 AM  Radiology DG Chest 2 View  Result Date: 05/09/2021 CLINICAL DATA:  Sickle cell pain in the chest since yesterday. EXAM: CHEST - 2 VIEW COMPARISON:  May 08, 2021 FINDINGS: The heart size and mediastinal contours are within normal limits. Both lungs are clear. The visualized skeletal structures are unremarkable. IMPRESSION: No active cardiopulmonary disease. Electronically Signed   By: Abelardo Diesel M.D.   On: 05/09/2021 13:51   DG Chest Portable 1 View  Result Date: 05/08/2021 CLINICAL DATA:  Chest pain, reports sickle cell crisis. EXAM: PORTABLE CHEST 1 VIEW COMPARISON:  Radiograph 03/30/2021, chest CT 06/23/2016 FINDINGS: The heart size and mediastinal contours are within normal limits. No focal airspace  disease. No pleural effusion or pneumothorax. 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 IMPRESSION: No evidence of acute cardiopulmonary disease. Electronically Signed   By: Maurine Simmering   On: 05/08/2021 16:47    Procedures Procedures   CRITICAL CARE Performed by: Fredia Sorrow Total critical care time: 35 minutes Critical care time was  exclusive of separately billable procedures and treating other patients. Critical care was necessary to treat or prevent imminent or life-threatening deterioration. Critical care was time spent personally by me on the following activities: development of treatment plan with patient and/or surrogate as well as nursing, discussions with consultants, evaluation of patient's response to treatment, examination of patient, obtaining history from patient or surrogate, ordering and performing treatments and interventions, ordering and review of laboratory studies, ordering and review of radiographic studies, pulse oximetry and re-evaluation of patient's condition.   Medications Ordered in ED Medications  dextrose 5 %-0.45 % sodium chloride infusion ( Intravenous New Bag/Given 05/09/21 1431)  ondansetron (ZOFRAN) injection 4 mg (4 mg Intravenous Given 05/09/21 1430)  morphine 4 MG/ML injection 6 mg (6 mg Intravenous Given 05/09/21 1430)  morphine 4 MG/ML injection 8 mg (8 mg Intravenous Given 05/09/21 1518)    ED Course  I have reviewed the triage vital signs and the nursing notes.  Pertinent labs & imaging results that were available during my care of the patient were reviewed by me and considered in my medical decision making (see chart for details).    MDM Rules/Calculators/A&P                          Patient with persistent pain that she is is typical for sickle cell pain same pain she had yesterday.  Today's labs without any significant changes.  Chest x-ray without any acute findings.  EKG without acute findings.  However what is most important is that her troponins again today x2 are normal.  Her BUN and creatinine are a little worse than yesterday but her GFR's are normally in the 30s.  Currently she is 31.  Blood sugar is up some at 387.  CO2 is 22.  We will give her touch of some insulin here.  No evidence of metabolic acidosis.  Will treat patient with pain medicine.  If it does not improve her  pain feel that she needs to have admission this time.  Since patient went home yesterday not feeling very well.  Patient turned over to the evening physician Dr. Ralene Bathe. Final Clinical Impression(s) / ED Diagnoses Final diagnoses:  Sickle cell pain crisis [facility])    Rx / DC Orders ED Discharge Orders     None        Fredia Sorrow, MD 05/09/21 1535

## 2021-05-09 NOTE — H&P (Signed)
History and Physical    PLEASE NOTE THAT DRAGON DICTATION SOFTWARE WAS USED IN THE CONSTRUCTION OF THIS NOTE.   [name] [id number] DOB: Jan 26, 1946 DOA: 05/09/2021  PCP: Vincente Liberty, MD Patient coming from: home   I have personally briefly reviewed patient's old medical records in [place]  Chief Complaint: Bilateral shoulder pain  HPI: [name] is a 75 y.o. female with medical history significant for sickle cell disease with history of sickle cell pain crisis and chronic anemia with baseline hemoglobin 8-11, type 2 diabetes mellitus, stage IIIb chronic kidney disease associate with baseline creatinine 1.4-1.6, hypertension, acquired hypothyroidism, who is admitted to [facility] on 05/09/2021 with sickle cell pain crisis after presenting from home to [facility] ED complaining of bilateral shoulder pain.   The patient reports 2 days of bilateral shoulder pain associated with thoracic back discomfort as well as bilateral anterior chest wall discomfort in a distribution, intensity, and quality similar to that which she has experienced with previous episodes of sickle cell pain crisis.  She denies any associated shortness of breath palpitations, diaphoresis, nausea, vomiting, dizziness, presyncope, or syncope.  No preceding trauma.  Denies any associated lower extremity edema, calf tenderness, or new onset lower extremity erythema.  No recent travel or known COVID-19 exposures.  She reports that her chest discomfort is nonexertional, nonpleuritic, and not on positional, and notes that the intensity, location, quality of her chest discomfort is very similar to that which she has experienced at times of previous sickle cell pain crises.  Denies any associated subjective fever, chills, rigors, or generalized myalgias.  Denies any recent headache, neck stiffness, rhinitis, rhinorrhea, sore throat, wheezing, cough, abdominal pain, diarrhea, or rash.  Not associate with any  recent dysuria, gross hematuria, or change in urinary urgency/frequency.  The patient conveys that she has been spending some additional time outside over the last several days in the setting of increasing ambient temperatures in the absence of compensatory increase in water consumption.  Denies any recent or recurrent alcohol consumption , and also denies any recreational drug use.  In the context of the above, the patient reported to [facility] emergency department yesterday for further evaluation and management of her bilateral shoulder pain, back pain, and chest discomfort at which time she was managed with IV analgesia following which she experienced some improvement in the associated discomfort, prompting her to be discharged home from the ED yesterday.  However, she reports ensuing subsequent worsening of the above discomfort, prompting her to present back to [facility] long emergency department today for further evaluation management thereof.  In the setting of 2 days of the above discomfort, the patient reports that she has missed multiple doses of her home oral medications as well as insulin, specifically noting that her most recent dose of basal Lantus 18 units subcu nightly occurred on 05/07/2021.  She also reports missing doses of her losartan, Toprol-XL, Norvasc, and spironolactone in the interval.     ED Course:  Vital signs in the ED were notable for the following: Temperature max 98.5, heart rate 77-87; blood pressure initially 192/66, which is decreased to 174/63 following initiation of IV analgesia; respiratory rate 18-23, oxygen saturation 93 to 100% on room air.  Labs were notable for the following: BMP was notable for the following: Sodium 136, potassium 4.4, bicarbonate 22, anion gap 10, BUN 29, creatinine 1.67 relative to baseline creatinine range of 1.4-1.6, glucose 387.  Total bilirubin 1.3, which is unchanged from yesterday's  value, and relative to 1.2 on 03/30/2021.  Otherwise,  presenting liver enzymes were found to be within normal limits.  High-sensitivity troponin I x2 were found to be 5.  CBC notable for white blood cell count of 18,000 with 84% neutrophils, hemoglobin 10.6 relative to 11.1 yesterday, and associated with normocytic, normochromic findings as well as nonelevated RDW.  3.3% reticulocyte count.  Nasopharyngeal COVID-19/influenza PCR checked in the emergency department today, with results currently pending.  EKG demonstrates sinus rhythm with PACs, heart rate 85, normal intervals, and no evidence of T wave or ST changes, including no evidence of ST elevation.  Chest x-ray showed no evidence of acute cardiopulmonary process, including no evidence of infiltrate, edema, effusion, or pneumothorax.  While in the ED, the following were administered: Morphine 6 mg IV x1, morphine 8 mg IV x1, NovoLog 10 units IV x1.  In terms of IV fluids, she was initially started on D5 half-normal saline, but following realization of concomitant presenting hyperglycemia and receipt of interval 100 cc of this IV fluid, she was transitioned to normal saline at 75 cc/h.  Subsequently, the patient was admitted for overnight observation for further evaluation management of presenting sickle cell pain crisis, including optimization of pain control.      Review of Systems: As per HPI otherwise 10 point review of systems negative.   Past Medical History:  Diagnosis Date   Abdominal pain    Coronary artery disease    Diabetes mellitus without complication ([place])    Glaucoma    Hypertension    Hypothyroid    Renal insufficiency 09/01/2017   Sickle cell anemia (HCC)     Past Surgical History:  Procedure Laterality Date   CHOLECYSTECTOMY     HIP SURGERY     6 yrs ago   REFRACTIVE SURGERY     RETINAL DETACHMENT SURGERY      Social History:  reports that she has quit smoking. She has never used smokeless tobacco. She reports that she does not drink alcohol and does not use  drugs.   Allergies  Allergen Reactions   Bee Venom Swelling   Nsaids Other (See Comments)    Dyspepsia. Advised by Primary Provider to avoid because of Kidneys    Family History  Problem Relation Age of Onset   Diabetes Mother    Cancer Father        Prostate   Diabetes Father    Cancer Brother    Diabetes Brother    Breast cancer Maternal Aunt        does not know age    Family history reviewed and not pertinent    Prior to Admission medications   Medication Sig Start Date End Date Taking? Authorizing Provider  acetaminophen (TYLENOL) 500 MG tablet Take 1,000 mg by mouth every 6 (six) hours as needed for mild pain or moderate pain.    Yes [provider]  amLODipine (NORVASC) 10 MG tablet Take 10 mg by mouth daily.   Yes [provider]  aspirin EC 81 MG tablet Take 81 mg by mouth daily.   Yes [provider]  atorvastatin (LIPITOR) 40 MG tablet Take 40 mg by mouth at bedtime.    Yes [provider]  Cholecalciferol (VITAMIN D3) 1.25 MG (50000 UT) CAPS Take 50,000 capsules by mouth once a week. 04/19/21  Yes [provider]  cycloSPORINE (RESTASIS) 0.05 % ophthalmic emulsion Place 1 drop into both eyes 2 (two) times daily.   Yes [provider]  ferrous sulfate 325 (65 FE) MG tablet Take 325 mg by mouth daily with breakfast.   Yes [provider]  folic acid (FOLVITE) 1 MG tablet Take 1 mg by mouth daily.   Yes [provider]  glipiZIDE (GLUCOTROL XL) 5 MG 24 hr tablet Take 5 mg by mouth 3 (three) times a week. Mon, Wed, Fri   Yes [provider]  insulin glargine (LANTUS) 100 UNIT/ML injection Inject 0.18 mLs (18 Units total) into the skin at bedtime. 09/02/17  Yes Leana Gamer, MD  levothyroxine (SYNTHROID) 175 MCG tablet Take 175 mcg by mouth daily before breakfast.   Yes [provider]  losartan (COZAAR) 100 MG tablet Take 100 mg by mouth daily.   Yes Vincente Liberty, MD   LUMIGAN 0.01 % SOLN Place 1 drop into both eyes at bedtime. Separate by at least 10 minutes from other intraocular pressure reducing ophthalmic drugs 06/06/19  Yes [provider]  meclizine (ANTIVERT) 25 MG tablet Take 25 mg by mouth 3 (three) times daily as needed for dizziness. 04/25/21  Yes [provider]  metoprolol succinate (TOPROL-XL) 50 MG 24 hr tablet Take 50 mg by mouth at bedtime. Take with or immediately following a meal.   Yes [provider]  RHOPRESSA 0.02 % SOLN Place 1 drop into the left eye at bedtime. 02/16/21  Yes [provider]  spironolactone (ALDACTONE) 25 MG tablet Take 25 mg by mouth daily. 05/23/19  Yes [provider]  vitamin C (ASCORBIC ACID) 500 MG tablet Take 500 mg by mouth daily.   Yes [provider]  oxyCODONE 10 MG TABS Take 1 tablet (10 mg total) by mouth every 6 (six) hours as needed for severe pain. Patient not taking: No sig reported 06/16/19   Dorena Dew, FNP     Objective    Physical Exam: Vitals:   05/09/21 1815 05/09/21 1830 05/09/21 1845 05/09/21 1900  BP:  (!) 170/62  (!) 169/65  Pulse: 77 81 80 82  Resp: '18 18 17 17  '$ Temp:      TempSrc:      SpO2: 94% 94% 93% 93%  Weight:      Height:        General: appears to be stated age; alert, oriented Skin: warm, dry, no rash Head:  AT/[place] Mouth:  Oral mucosa membranes appear moist, normal dentition Neck: supple; trachea midline Heart:  RRR; did not appreciate any M/R/G Lungs: CTAB, did not appreciate any wheezes, rales, or rhonchi Abdomen: + BS; soft, ND, NT Vascular: 2+ pedal pulses b/l; 2+ radial pulses b/l Extremities: no peripheral edema, no muscle wasting Neuro: strength and sensation intact in upper and lower extremities b/l   Labs on Admission: I have personally reviewed following labs and imaging studies  CBC: Recent Labs  Lab 05/08/21 1608 05/09/21 1054  WBC 16.3* 18.3*  NEUTROABS 13.6* 15.4*  HGB 11.1* 10.6*   HCT 30.9* 29.5*  MCV 81.1 80.6  PLT 275 [id number]   Basic Metabolic Panel: Recent Labs  Lab 05/08/21 1608 05/09/21 1054  NA 139 136  K 4.0 4.4  CL 108 104  CO2 22 22  GLUCOSE 225* 387*  BUN 28* 29*  CREATININE 1.45* 1.67*  CALCIUM 10.1 10.0   GFR: Estimated Creatinine Clearance: 28.3 mL/min (A) (by C-G formula based on SCr of 1.67 mg/dL (H)). Liver Function Tests: Recent Labs  Lab 05/08/21 1608 05/09/21 1054  AST 21 22  ALT 21 23  ALKPHOS 117 129*  BILITOT 1.3* 1.3*  PROT 8.6* 8.6*  ALBUMIN 4.5 4.5   No results for input(s): LIPASE, AMYLASE in the last 168 hours. No results for input(s): AMMONIA in the last 168 hours. Coagulation Profile: No results for input(s): INR, PROTIME in the last 168 hours. Cardiac Enzymes: No results for input(s): CKTOTAL, CKMB, CKMBINDEX, TROPONINI in the last 168 hours. BNP (last 3 results) No results for input(s): PROBNP in the last 8760 hours. HbA1C: No results for input(s): HGBA1C in the last 72 hours. CBG: No results for input(s): GLUCAP in the last 168 hours. Lipid Profile: No results for input(s): CHOL, HDL, LDLCALC, TRIG, CHOLHDL, LDLDIRECT in the last 72 hours. Thyroid Function Tests: No results for input(s): TSH, T4TOTAL, FREET4, T3FREE, THYROIDAB in the last 72 hours. Anemia Panel: Recent Labs    05/08/21 1608 05/09/21 1054  RETICCTPCT 3.5* 3.3*   Urine analysis:    Component Value Date/Time   COLORURINE YELLOW 03/30/2021 1016   APPEARANCEUR CLEAR 03/30/2021 1016   LABSPEC 1.011 03/30/2021 1016   PHURINE 6.0 03/30/2021 1016   GLUCOSEU NEGATIVE 03/30/2021 1016   HGBUR NEGATIVE 03/30/2021 1016   BILIRUBINUR NEGATIVE 03/30/2021 1016   KETONESUR NEGATIVE 03/30/2021 1016   PROTEINUR 100 (A) 03/30/2021 1016   UROBILINOGEN 1.0 02/21/2011 1127   NITRITE NEGATIVE 03/30/2021 1016   LEUKOCYTESUR NEGATIVE 03/30/2021 1016    Radiological Exams on Admission: DG Chest 2 View  Result Date: 05/09/2021 CLINICAL DATA:  Sickle  cell pain in the chest since yesterday. EXAM: CHEST - 2 VIEW COMPARISON:  May 08, 2021 FINDINGS: The heart size and mediastinal contours are within normal limits. Both lungs are clear. The visualized skeletal structures are unremarkable. IMPRESSION: No active cardiopulmonary disease. Electronically Signed   By: Abelardo Diesel M.D.   On: 05/09/2021 13:51   DG Chest Portable 1 View  Result Date: 05/08/2021 CLINICAL DATA:  Chest pain, reports sickle cell crisis. EXAM: PORTABLE CHEST 1 VIEW COMPARISON:  Radiograph 03/30/2021, chest CT 06/23/2016 FINDINGS: The heart size and mediastinal contours are within normal limits. No focal airspace disease. No pleural effusion or pneumothorax. 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 IMPRESSION: No evidence of acute cardiopulmonary disease. Electronically Signed   By: Maurine Simmering   On: 05/08/2021 16:47     EKG: Independently reviewed, with result as described above.    Assessment/Plan   Kaci Beermann Dohmen is a 75 y.o. female with medical history significant for sickle cell disease with history of sickle cell pain crisis and chronic anemia with baseline hemoglobin 8-11, type 2 diabetes mellitus, stage IIIb chronic kidney disease associate with baseline creatinine 1.4-1.6, hypertension, acquired hypothyroidism, who is admitted to [facility] on 05/09/2021 with sickle cell pain crisis after presenting from home to [facility] ED complaining of bilateral shoulder pain.    Principal Problem:   Sickle cell pain crisis ([place]) Active Problems:   DM2 (diabetes mellitus, type 2) (HCC)   HTN (hypertension)   CKD (chronic kidney disease), stage III (HCC)   Leukocytosis   Hypothyroid     #) Acute Sickle Cell Pain Crisis: In the setting of a history of known sickle cell disease with anemia Baseline hemoglobin of [id number] and complicated by multiple prior episodes of sickle cell pain crisis, the patient presents with bilateral shoulder, back, and bilateral  anterior chest wall pain with quality, distribution, and intensity of pain associated with prior episodes of sickle cell pain crisis, with labs reflecting Hgb 10.6, representing decline from  yesterday's value of 11.1, and 3.3% adjusted reticulocyte count.  Given persistence and worsening of the above discomfort after being discharged from the emergency department to home yesterday, and in the context of current suboptimal pain control in spite of 2 doses of IV morphine in the ED today, the patient is being admitted for further evaluation and management of sickle cell pain crisis, including emphasis on optimizing associated pain control, as further detailed below.  No clinical or radiographic evidence to suggest acute chest syndrome, and presenting EKG shows no evidence of acute ischemic changes.  Suspect contribution from dehydration given recent increase in ambient temperatures without compensatory increase in oral water consumption.  No evidence of underlying infectious process at this time, including chest x-ray which shows no evidence of acute cardiopulmonary process, including no evidence of pneumonia.  However, will check urinalysis for further evaluation of underlying infectious process, and monitor for results of COVID-19/influenza PCR that were checked in the ED today.  Overall, will aggressively treat pain, as further described low, with close monitoring for development of respiratory depression.  Plan is for initiation of Dilaudid PCA, and I have placed orders and parameters for initiation of such.  However, due to unclear amount of time before the patient is moved out of the emergency department I have also ordered as needed IV Dilaudid dosing to be administered until the patient is eligible for Dilaudid PCA upon arrival to the PCU, we are unable to utilize Dilaudid PCA in the ED. I have placed specific orders with inpatient pharmacy for discontinuation of as needed IV Dilaudid upon initiation of  Dilaudid PCA in order to reduce risk for this redundancy.  Additionally, I explained this plan to the patient, who verbally conveys her understanding and amenability.  No indication for transfusion exchange at this time.    Plan: Aggressive opioid analgesia in the form of Dilaudid PCA, which has been ordered, with bridging as needed IV Dilaudid dosing while still in the ED, as further described above.  Sickle cell pain assessment per protocol.  Monitor on telemetry.  Monitor continuous pulse oximetry.  Close monitoring for development of respiratory depression.  As needed supplemental oxygen order to maintain oxygen saturations of 90 to 92%, with care to not over oxygenate with supplemental oxygen as this can suppress bone marrow production of RBCs.  As needed K pad to the affected areas.  Transition IV fluids from normal saline to half-normal saline, with caution to not induce volume overload.  Monitor strict I's and O's and daily weights.  Close monitoring of ensuing renal function, including repeat BMP in the morning.  Check urinalysis.  Follow for result of COVID-19/influenza PCR checked in the ED this evening.  Repeat troponin.  Check INR.      #) Leukocytosis: Presenting CBC reflects elevated white blood cell count of 18,000.  Suspect multifactorial noninfectious contributions to this elevated value, including inflammatory response in the setting of sickle cell pain crisis as well as hemoconcentrating effects of suspected contributory dehydration, as further detailed above.  No evidence of underlying infectious process at this time, including no evidence of pneumonia on chest x-ray.  However, will check urinalysis to follow for result of COVID-19 PCR, as further detailed above.  Presentation does not appear consistent with sepsis, and no evidence of hypotension thus far.  Plan: Check urinalysis.  Follow-up result of nasopharyngeal COVID-19/influenza PCR checked in the ED today.  IV fluids, as  above.  Monitor strict I's and O's and daily weights.  Repeat CBC with differential in the morning.  Further evaluation and management of presenting sickle cell pain crisis, as above.      #) Stage IIIb chronic kidney disease: Associated baseline creatinine range of 1.4-1.6, with presenting serum creatinine consistent with this baseline range.  In the setting of sickle cell pain crisis, will closely monitor ensuing renal function, with repeat BMP in the morning, while providing additional IV fluids, as below.  Plan: Monitor strict I's and O's and daily weights.  Half-normal saline as above.  Tempt avoid nephrotoxic agents.  Repeat BMP in the morning.      #) Type 2 diabetes mellitus: Regimen includes Lantus 18 units subcu nightly as well as glipizide, with the patient acknowledging missed doses of these medications over the last few days in the setting of sickle cell pain crisis, with most recent such doses occurring on 05/07/2021.  In the setting of these missed doses as well as physiologic stress stemming from presenting sickle cell pain crisis, initial blood sugar per presenting BMP noted to be 387 in the absence of any evidence of anion gap metabolic acidosis to suggest concomitant DKA.  We will provide additional IV fluids as well as initiation of Accu-Cheks with sliding scale insulin in addition to resumption of a portion of her home basal insulin.  Plan: Hold home glipizide for now.  Start Lantus 7 units subcu nightly, with first dose to occur now.  Accu-Cheks before every meal and at bedtime with low-dose sliding scale insulin.  Continuous IV fluids, as above.  Repeat BMP in the morning.      #) Acquired hypothyroidism: On Synthroid as an outpatient.  Plan: Continue home Synthroid.      #) Essential hypertension: Outpatient antihypertensive regimen includes Norvasc, losartan, Toprol-XL, and spironolactone.  In the setting of presenting sickle cell pain crisis, with suspected  contribution from dehydration, will hold home spironolactone for now.  Additionally, given mild elevation in patient's creatinine relative to baseline, as well as increased risk for overt acute kidney injury in the setting of presenting sickle cell pain crisis, will hold home losartan for now, while resuming home Norvasc and Toprol-XL.  Plan: Hold home losartan and spironolactone, as above.  Resume home Toprol-XL and Norvasc.  Close monitoring of ensuing blood pressure via routine vital signs.       DVT prophylaxis: SCDs Code Status: Full code Family Communication: none Disposition Plan: Per Rounding Team Consults called: none  Admission status: Observation; PCU     Of note, this patient was added by me to the following Admit List/Treatment Team: [email or web address].      PLEASE NOTE THAT DRAGON DICTATION SOFTWARE WAS USED IN THE CONSTRUCTION OF THIS NOTE.   [place] [facility] Pager [phone] From [place]  Otherwise, please contact night-coverage  [email or web address] Password [email or web address]   05/09/2021, 7:03 PM

## 2021-05-10 DIAGNOSIS — Z79899 Other long term (current) drug therapy: Secondary | ICD-10-CM | POA: Diagnosis not present

## 2021-05-10 DIAGNOSIS — Z79891 Long term (current) use of opiate analgesic: Secondary | ICD-10-CM | POA: Diagnosis not present

## 2021-05-10 DIAGNOSIS — I10 Essential (primary) hypertension: Secondary | ICD-10-CM | POA: Diagnosis not present

## 2021-05-10 DIAGNOSIS — D5700 Hb-SS disease with crisis, unspecified: Secondary | ICD-10-CM | POA: Diagnosis present

## 2021-05-10 DIAGNOSIS — Z7982 Long term (current) use of aspirin: Secondary | ICD-10-CM | POA: Diagnosis not present

## 2021-05-10 DIAGNOSIS — J90 Pleural effusion, not elsewhere classified: Secondary | ICD-10-CM | POA: Diagnosis present

## 2021-05-10 DIAGNOSIS — Z833 Family history of diabetes mellitus: Secondary | ICD-10-CM | POA: Diagnosis not present

## 2021-05-10 DIAGNOSIS — E860 Dehydration: Secondary | ICD-10-CM | POA: Diagnosis present

## 2021-05-10 DIAGNOSIS — E039 Hypothyroidism, unspecified: Secondary | ICD-10-CM | POA: Diagnosis present

## 2021-05-10 DIAGNOSIS — Z87891 Personal history of nicotine dependence: Secondary | ICD-10-CM | POA: Diagnosis not present

## 2021-05-10 DIAGNOSIS — D57219 Sickle-cell/Hb-C disease with crisis, unspecified: Secondary | ICD-10-CM | POA: Diagnosis present

## 2021-05-10 DIAGNOSIS — E1165 Type 2 diabetes mellitus with hyperglycemia: Secondary | ICD-10-CM | POA: Diagnosis present

## 2021-05-10 DIAGNOSIS — Z79890 Hormone replacement therapy (postmenopausal): Secondary | ICD-10-CM | POA: Diagnosis not present

## 2021-05-10 DIAGNOSIS — I2510 Atherosclerotic heart disease of native coronary artery without angina pectoris: Secondary | ICD-10-CM | POA: Diagnosis present

## 2021-05-10 DIAGNOSIS — G894 Chronic pain syndrome: Secondary | ICD-10-CM | POA: Diagnosis present

## 2021-05-10 DIAGNOSIS — N1832 Chronic kidney disease, stage 3b: Secondary | ICD-10-CM | POA: Diagnosis present

## 2021-05-10 DIAGNOSIS — E1122 Type 2 diabetes mellitus with diabetic chronic kidney disease: Secondary | ICD-10-CM | POA: Diagnosis present

## 2021-05-10 DIAGNOSIS — Z91030 Bee allergy status: Secondary | ICD-10-CM | POA: Diagnosis not present

## 2021-05-10 DIAGNOSIS — I129 Hypertensive chronic kidney disease with stage 1 through stage 4 chronic kidney disease, or unspecified chronic kidney disease: Secondary | ICD-10-CM | POA: Diagnosis present

## 2021-05-10 DIAGNOSIS — Z886 Allergy status to analgesic agent status: Secondary | ICD-10-CM | POA: Diagnosis not present

## 2021-05-10 DIAGNOSIS — Z794 Long term (current) use of insulin: Secondary | ICD-10-CM | POA: Diagnosis not present

## 2021-05-10 DIAGNOSIS — Z7984 Long term (current) use of oral hypoglycemic drugs: Secondary | ICD-10-CM | POA: Diagnosis not present

## 2021-05-10 DIAGNOSIS — Z20822 Contact with and (suspected) exposure to covid-19: Secondary | ICD-10-CM | POA: Diagnosis present

## 2021-05-10 DIAGNOSIS — D72829 Elevated white blood cell count, unspecified: Secondary | ICD-10-CM | POA: Diagnosis present

## 2021-05-10 DIAGNOSIS — E119 Type 2 diabetes mellitus without complications: Secondary | ICD-10-CM | POA: Diagnosis not present

## 2021-05-10 DIAGNOSIS — N179 Acute kidney failure, unspecified: Secondary | ICD-10-CM | POA: Diagnosis present

## 2021-05-10 DIAGNOSIS — H409 Unspecified glaucoma: Secondary | ICD-10-CM | POA: Diagnosis present

## 2021-05-10 LAB — COMPREHENSIVE METABOLIC PANEL
ALT: 25 U/L (ref 0–44)
AST: 30 U/L (ref 15–41)
Albumin: 4 g/dL (ref 3.5–5.0)
Alkaline Phosphatase: 133 U/L — ABNORMAL HIGH (ref 38–126)
Anion gap: 8 (ref 5–15)
BUN: 34 mg/dL — ABNORMAL HIGH (ref 8–23)
CO2: 25 mmol/L (ref 22–32)
Calcium: 9.6 mg/dL (ref 8.9–10.3)
Chloride: 104 mmol/L (ref 98–111)
Creatinine, Ser: 2.02 mg/dL — ABNORMAL HIGH (ref 0.44–1.00)
GFR, Estimated: 26 mL/min — ABNORMAL LOW (ref >60)
Glucose, Bld: 215 mg/dL — ABNORMAL HIGH (ref 70–99)
Potassium: 4.4 mmol/L (ref 3.5–5.1)
Sodium: 137 mmol/L (ref 135–145)
Total Bilirubin: 1.5 mg/dL — ABNORMAL HIGH (ref 0.3–1.2)
Total Protein: 7.9 g/dL (ref 6.5–8.1)

## 2021-05-10 LAB — GLUCOSE, CAPILLARY
Glucose-Capillary: 214 mg/dL — ABNORMAL HIGH (ref 70–99)
Glucose-Capillary: 213 mg/dL — ABNORMAL HIGH (ref 70–99)

## 2021-05-10 LAB — CBC
HCT: 29.1 % — ABNORMAL LOW (ref 36.0–46.0)
Hemoglobin: 10.4 g/dL — ABNORMAL LOW (ref 12.0–15.0)
MCH: 29.2 pg (ref 26.0–34.0)
MCHC: 35.7 g/dL (ref 30.0–36.0)
MCV: 81.7 fL (ref 80.0–100.0)
Platelets: 174 10*3/uL (ref 150–400)
RBC: 3.56 MIL/uL — ABNORMAL LOW (ref 3.87–5.11)
RDW: 15.3 % (ref 11.5–15.5)
WBC: 17 10*3/uL — ABNORMAL HIGH (ref 4.0–10.5)
nRBC: 0.8 % — ABNORMAL HIGH (ref 0.0–0.2)

## 2021-05-10 LAB — URINALYSIS, ROUTINE W REFLEX MICROSCOPIC
Bacteria, UA: NONE SEEN
Bilirubin Urine: NEGATIVE
Glucose, UA: >=500 mg/dL — AB
Ketones, ur: NEGATIVE mg/dL
Leukocytes,Ua: NEGATIVE
Nitrite: NEGATIVE
Protein, ur: >=300 mg/dL — AB
Specific Gravity, Urine: 1.01 (ref 1.005–1.030)
pH: 5 (ref 5.0–8.0)

## 2021-05-10 LAB — CBG MONITORING, ED
Glucose-Capillary: 188 mg/dL — ABNORMAL HIGH (ref 70–99)
Glucose-Capillary: 189 mg/dL — ABNORMAL HIGH (ref 70–99)
Glucose-Capillary: 382 mg/dL — ABNORMAL HIGH (ref 70–99)

## 2021-05-10 LAB — SARS CORONAVIRUS 2 (TAT 6-24 HRS): SARS Coronavirus 2: NEGATIVE

## 2021-05-10 LAB — MAGNESIUM
Magnesium: 2.1 mg/dL (ref 1.7–2.4)
Magnesium: 2.2 mg/dL (ref 1.7–2.4)

## 2021-05-10 LAB — TROPONIN I (HIGH SENSITIVITY): Troponin I (High Sensitivity): 7 ng/L (ref <18)

## 2021-05-10 LAB — PROTIME-INR
INR: 1.1 (ref 0.8–1.2)
Prothrombin Time: 14.1 seconds (ref 11.4–15.2)

## 2021-05-10 MED ORDER — SODIUM CHLORIDE 0.45 % IV SOLN: INTRAVENOUS | Status: AC

## 2021-05-10 NOTE — Progress Notes (Signed)
Subjective: [name] is a 75 year old female with a medical history significant for sickle cell disease, anemia of chronic disease, type 2 diabetes mellitus, disease stage III, hypertension, acquired hypothyroidism that was admitted for sickle cell pain crisis. Prior to admission, patient had 2 days of bilateral shoulder pain that was unrelieved by her home medications.  Today, she says that pain has improved however it is moved to her bilateral lower extremities.  Today, she rates her pain as 5/10 she denies any headache, blurred vision, chest pain, urinary symptoms, nausea, vomiting, or diarrhea.  Objective:  Vital signs in last 24 hours:  Vitals:   05/10/21 0630 05/10/21 0900 05/10/21 1300 05/10/21 1555  BP: (!) 140/58 (!) 155/60 (!) 137/54 (!) 149/58  Pulse: 81 73 74 81  Resp: '12 12 11 16  '$ Temp:    98.6 F (37 C)  TempSrc:    Oral  SpO2: 98% 99% 99% 98%  Weight:      Height:        Intake/Output from previous day:   Intake/Output Summary (Last 24 hours) at 05/10/2021 1650 Last data filed at 05/10/2021 0420 Gross per 24 hour  Intake 336.25 ml  Output 300 ml  Net 36.25 ml    Physical Exam: General: Alert, awake, oriented x3, in no acute distress.  HEENT: [place]/AT [place]ERL, EOMI Neck: Trachea midline,  no masses, no thyromegal,y no JVD, no carotid bruit OROPHARYNX:  Moist, No exudate/ erythema/lesions.  Heart: Regular rate and rhythm, without murmurs, rubs, gallops, PMI non-displaced, no heaves or thrills on palpation.  Lungs: Clear to auscultation, no wheezing or rhonchi noted. No increased vocal fremitus resonant to percussion  Abdomen: Soft, nontender, nondistended, positive bowel sounds, no masses no hepatosplenomegaly noted..  Neuro: No focal neurological deficits noted cranial nerves II through XII grossly intact. DTRs 2+ bilaterally upper and lower extremities. Strength 5 out of 5 in bilateral upper and lower extremities. Musculoskeletal: No warm swelling or erythema  around joints, no spinal tenderness noted. Psychiatric: Patient alert and oriented x3, good insight and cognition, good recent to remote recall. Lymph node survey: No cervical axillary or inguinal lymphadenopathy noted.  Lab Results:  Basic Metabolic Panel:    Component Value Date/Time   NA 137 05/10/2021 0428   K 4.4 05/10/2021 0428   CL 104 05/10/2021 0428   CO2 25 05/10/2021 0428   BUN 34 (H) 05/10/2021 0428   CREATININE 2.02 (H) 05/10/2021 0428   GLUCOSE 215 (H) 05/10/2021 0428   CALCIUM 9.6 05/10/2021 0428   CBC:    Component Value Date/Time   WBC 17.0 (H) 05/10/2021 0428   HGB 10.4 (L) 05/10/2021 0428   HCT 29.1 (L) 05/10/2021 0428   PLT 174 05/10/2021 0428   MCV 81.7 05/10/2021 0428   NEUTROABS 15.4 (H) 05/09/2021 1054   LYMPHSABS 1.3 05/09/2021 1054   MONOABS 1.5 (H) 05/09/2021 1054   EOSABS 0.0 05/09/2021 1054   BASOSABS 0.0 05/09/2021 1054    Recent Results (from the past 240 hour(s))  SARS CORONAVIRUS 2 (TAT 6-24 HRS) Nasopharyngeal Nasopharyngeal Swab     Status: None   Collection Time: 05/09/21  4:18 PM   Specimen: Nasopharyngeal Swab  Result Value Ref Range Status   SARS Coronavirus 2 NEGATIVE NEGATIVE Final    Comment: (NOTE) SARS-CoV-2 target nucleic acids are NOT DETECTED.  The SARS-CoV-2 RNA is generally detectable in upper and lower respiratory specimens during the acute phase of infection. Negative results do not preclude SARS-CoV-2 infection, do not rule  out co-infections with other pathogens, and should not be used as the sole basis for treatment or other patient management decisions. Negative results must be combined with clinical observations, patient history, and epidemiological information. The expected result is Negative.  Fact Sheet for Patients: SugarRoll.be  Fact Sheet for Healthcare Providers: https://www.woods-mathews.com/  This test is not yet approved or cleared by the [place] FDA  and  has been authorized for detection and/or diagnosis of SARS-CoV-2 by FDA under an Emergency Use Authorization (EUA). This EUA will remain  in effect (meaning this test can be used) for the duration of the COVID-19 declaration under Se ction 564(b)(1) of the Act, 21 U.S.C. section 360bbb-3(b)(1), unless the authorization is terminated or revoked sooner.  Performed at [facility], [place] [address] [place], [place] [address]     Studies/Results: DG Chest 2 View  Result Date: 05/09/2021 CLINICAL DATA:  Sickle cell pain in the chest since yesterday. EXAM: CHEST - 2 VIEW COMPARISON:  May 08, 2021 FINDINGS: The heart size and mediastinal contours are within normal limits. Both lungs are clear. The visualized skeletal structures are unremarkable. IMPRESSION: No active cardiopulmonary disease. Electronically Signed   By: Abelardo Diesel M.D.   On: 05/09/2021 13:51    Medications: Scheduled Meds:  amLODipine  10 mg Oral Daily   aspirin EC  81 mg Oral Daily   atorvastatin  40 mg Oral QHS   cycloSPORINE  1 drop Both Eyes BID   HYDROmorphone   Intravenous Q4H   insulin aspart  0-5 Units Subcutaneous QHS   insulin aspart  0-9 Units Subcutaneous TID WC   insulin glargine  7 Units Subcutaneous QHS   levothyroxine  175 mcg Oral QAC breakfast   metoprolol succinate  50 mg Oral QHS   Netarsudil Dimesylate  1 drop Left Eye QHS   Continuous Infusions:  diphenhydrAMINE     PRN Meds:.acetaminophen **OR** acetaminophen, diphenhydrAMINE **OR** diphenhydrAMINE, HYDROmorphone (DILAUDID) injection, naloxone **AND** sodium chloride flush, ondansetron  Consultants: None  Procedures: None  Antibiotics: None  Assessment/Plan: Principal Problem:   Sickle cell pain crisis ([place]) Active Problems:   DM2 (diabetes mellitus, type 2) (HCC)   HTN (hypertension)   CKD (chronic kidney disease), stage III (HCC)   Leukocytosis   Hypothyroid   Sickle cell crisis (HCC)  Sickle cell disease with pain  crisis: Continue hydration 0.45% saline at 75 mL/h Clinician assisted IV Dilaudid while patient awaits room placement IV Dilaudid PCA will be started when patient is transition from the emergency department. Maintain oxygen saturation above 90% Monitor vital signs very closely and reevaluate pain scale regularly  Leukocytosis: Improved.  More than likely noninfectious, no evidence of underlying infectious process.  COVID-19 negative.  Chest x-ray shows no acute cardiopulmonary process.  Urinalysis unremarkable.  Secondary to vaso-occlusive pain crisis. Patient has no signs of active infection.  Continue to follow closely.  Labs in AM.  Anemia of chronic disease: Patient's hemoglobin is stable with her baseline.  There is no clinical indication for blood transfusion at this time.  Labs in AM.  Acute kidney injury superimposed on chronic disease stage IIIb: Patient's baseline creatinine is 1.4-1.6.  Today, creatinine has increased above baseline at 2.0 to.  We will restart increased this morning likely dehydration.  Trend creatinine.  Time being.  BP remained stable.  Labs in AM.  Type 2 diabetes mellitus: At that time.  Also, SSI with meal coverage.  Hold home glipizide.  Hypothyroidism: Continue home medications  Essential hypertension: Blood pressure stable today.  We will continue to hold losartan due to increased creatinine level.  Also, hold spironolactone.  Monitor blood pressure closely.  Continue telemetry.     Code Status: Full Code Family Communication: N/A   Donia Pounds  APRN, MSN, FNP-C [facility] [address] [place], [place] [address] [phone]  If 7PM-7AM, please contact night-coverage.  05/10/2021, 4:50 PM  LOS: 0 days

## 2021-05-10 NOTE — ED Notes (Signed)
Lunch tray provided. 

## 2021-05-10 NOTE — ED Notes (Signed)
Pt's O2 sats 84%, placed on 2 L [place]. O2 sats improved to 99%.

## 2021-05-10 NOTE — ED Notes (Signed)
CBG: 188 mg/dL

## 2021-05-10 NOTE — ED Notes (Signed)
Breakfast tray provided. 

## 2021-05-11 ENCOUNTER — Inpatient Hospital Stay (HOSPITAL_COMMUNITY): Payer: 59

## 2021-05-11 DIAGNOSIS — E039 Hypothyroidism, unspecified: Secondary | ICD-10-CM

## 2021-05-11 DIAGNOSIS — Z794 Long term (current) use of insulin: Secondary | ICD-10-CM

## 2021-05-11 DIAGNOSIS — D72829 Elevated white blood cell count, unspecified: Secondary | ICD-10-CM

## 2021-05-11 DIAGNOSIS — E119 Type 2 diabetes mellitus without complications: Secondary | ICD-10-CM

## 2021-05-11 DIAGNOSIS — N1832 Chronic kidney disease, stage 3b: Secondary | ICD-10-CM

## 2021-05-11 DIAGNOSIS — I10 Essential (primary) hypertension: Secondary | ICD-10-CM

## 2021-05-11 LAB — BASIC METABOLIC PANEL
Anion gap: 9 (ref 5–15)
BUN: 47 mg/dL — ABNORMAL HIGH (ref 8–23)
CO2: 23 mmol/L (ref 22–32)
Calcium: 9 mg/dL (ref 8.9–10.3)
Chloride: 98 mmol/L (ref 98–111)
Creatinine, Ser: 2.2 mg/dL — ABNORMAL HIGH (ref 0.44–1.00)
GFR, Estimated: 23 mL/min — ABNORMAL LOW (ref >60)
Glucose, Bld: 230 mg/dL — ABNORMAL HIGH (ref 70–99)
Potassium: 4.8 mmol/L (ref 3.5–5.1)
Sodium: 130 mmol/L — ABNORMAL LOW (ref 135–145)

## 2021-05-11 LAB — GLUCOSE, CAPILLARY
Glucose-Capillary: 202 mg/dL — ABNORMAL HIGH (ref 70–99)
Glucose-Capillary: 256 mg/dL — ABNORMAL HIGH (ref 70–99)
Glucose-Capillary: 259 mg/dL — ABNORMAL HIGH (ref 70–99)
Glucose-Capillary: 178 mg/dL — ABNORMAL HIGH (ref 70–99)

## 2021-05-11 LAB — CBC
HCT: 27.7 % — ABNORMAL LOW (ref 36.0–46.0)
Hemoglobin: 9.7 g/dL — ABNORMAL LOW (ref 12.0–15.0)
MCH: 28.6 pg (ref 26.0–34.0)
MCHC: 35 g/dL (ref 30.0–36.0)
MCV: 81.7 fL (ref 80.0–100.0)
Platelets: 194 10*3/uL (ref 150–400)
RBC: 3.39 MIL/uL — ABNORMAL LOW (ref 3.87–5.11)
RDW: 15.5 % (ref 11.5–15.5)
WBC: 18.2 10*3/uL — ABNORMAL HIGH (ref 4.0–10.5)
nRBC: 1 % — ABNORMAL HIGH (ref 0.0–0.2)

## 2021-05-11 MED ORDER — HYDROMORPHONE 1 MG/ML IV SOLN
INTRAVENOUS | Status: DC
  Administered 2021-05-11: 0 mg via INTRAVENOUS
  Administered 2021-05-11: 1.2 mg via INTRAVENOUS
  Administered 2021-05-11: 0.6 mg via INTRAVENOUS
  Administered 2021-05-12 (×3): 0 mg via INTRAVENOUS

## 2021-05-11 MED ORDER — SODIUM CHLORIDE 0.9 % IV SOLN
2.0000 g | INTRAVENOUS | Status: AC
  Administered 2021-05-11: 2 g via INTRAVENOUS
  Filled 2021-05-11: qty 2

## 2021-05-11 MED ORDER — PANTOPRAZOLE SODIUM 40 MG PO TBEC
40.0000 mg | DELAYED_RELEASE_TABLET | Freq: Every day | ORAL | Status: DC
  Administered 2021-05-11 – 2021-05-15 (×5): 40 mg via ORAL
  Filled 2021-05-11 (×5): qty 1

## 2021-05-11 MED ORDER — METOCLOPRAMIDE HCL 5 MG PO TABS: 5.0000 mg | ORAL_TABLET | Freq: Three times a day (TID) | ORAL | Status: DC | PRN

## 2021-05-11 MED ORDER — SODIUM CHLORIDE 0.45 % IV SOLN: INTRAVENOUS | Status: AC

## 2021-05-11 MED ORDER — OXYCODONE HCL 5 MG PO TABS
10.0000 mg | ORAL_TABLET | Freq: Four times a day (QID) | ORAL | Status: DC | PRN
  Administered 2021-05-11 – 2021-05-12 (×2): 10 mg via ORAL
  Filled 2021-05-11 (×2): qty 2

## 2021-05-11 MED ORDER — SODIUM CHLORIDE 0.9 % IV SOLN
2.0000 g | INTRAVENOUS | Status: DC
  Administered 2021-05-12 – 2021-05-14 (×3): 2 g via INTRAVENOUS
  Filled 2021-05-11 (×4): qty 2

## 2021-05-11 NOTE — Progress Notes (Signed)
Pharmacy Antibiotic Note  [name] is a 75 y.o. female admitted on 05/09/2021 with sickle cell pain crisis. Pharmacy has been consulted for Cefepime dosing due to fever.  Plan: Cefepime 2g IV q24h Monitor renal function, cultures, clinical course  Height: 5' 3.5" (161.3 cm) Weight: 69.5 kg (153 lb 3.5 oz) IBW/kg (Calculated) : 53.55  Temp (24hrs), Avg:98.9 F (37.2 C), Min:97.8 F (36.6 C), Max:100.3 F (37.9 C)  Recent Labs  Lab 05/08/21 1608 05/09/21 1054 05/10/21 0428 05/11/21 0634  WBC 16.3* 18.3* 17.0* 18.2*  CREATININE 1.45* 1.67* 2.02* 2.20*    Estimated Creatinine Clearance: 21.9 mL/min (A) (by C-G formula based on SCr of 2.2 mg/dL (H)).    Allergies  Allergen Reactions   Bee Venom Swelling   Nsaids Other (See Comments)    Dyspepsia. Advised by Primary Provider to avoid because of Kidneys    Antimicrobials this admission: 6/11 Cefepime >>  Dose adjustments this admission: --  Microbiology results: 6/11 BCx:  6/11 UCx:     Thank you for allowing pharmacy to be a part of this patient's care.  Luiz Ochoa 05/11/2021 7:00 PM

## 2021-05-11 NOTE — Progress Notes (Signed)
Dr Doreene Burke notified of fever, new orders given. Will continue to monitor.

## 2021-05-11 NOTE — Progress Notes (Signed)
Patient ID: [name], female   DOB: [birth year], 75 y.o.   MRN: [id number] Subjective: [name] is a 75 year old female with a medical history significant for sickle cell disease, anemia of chronic disease, type 2 diabetes mellitus, disease stage III, hypertension, acquired hypothyroidism that was admitted for sickle cell pain crisis.  Patient is complaining of significant pain in her epigastric region and also in her bilateral shoulder region.  Although she is moaning in pain, she rates her pain a 5/10.  She describes her epigastric pain as sharp with associated nausea but no vomiting although her husband who is at the bedside says she did vomit once.  She however denies any of the symptoms.  She has no diarrhea.  Objective:  Vital signs in last 24 hours:  Vitals:   05/11/21 0458 05/11/21 0502 05/11/21 0815 05/11/21 1249  BP:  (!) 144/54    Pulse:  68    Resp: '16 16 14 20  '$ Temp:  97.8 F (36.6 C)    TempSrc:  Oral    SpO2: 95% 95% 96% 96%  Weight:  69.5 kg    Height:        Intake/Output from previous day:   Intake/Output Summary (Last 24 hours) at 05/11/2021 1327 Last data filed at 05/11/2021 0835 Gross per 24 hour  Intake 38.54 ml  Output 300 ml  Net -261.46 ml    Physical Exam: General: Alert, awake, oriented x3, in mild painful distress.  HEENT: [place]/AT PEER[place], EOMI Neck: Trachea midline,  no masses, no thyromegal,y no JVD, no carotid bruit OROPHARYNX:  Moist, No exudate/ erythema/lesions.  Heart: Regular rate and rhythm, without murmurs, rubs, gallops, PMI non-displaced, no heaves or thrills on palpation.  Lungs: Clear to auscultation, no wheezing or rhonchi noted. No increased vocal fremitus resonant to percussion  Abdomen: Epigastric fullness and tenderness, but soft, nontender, nondistended, positive bowel sounds, no masses no hepatosplenomegaly noted..  Neuro: No focal neurological deficits noted cranial nerves II through XII grossly intact. DTRs 2+ bilaterally  upper and lower extremities. Strength 5 out of 5 in bilateral upper and lower extremities. Musculoskeletal: No warm swelling or erythema around joints, no spinal tenderness noted. Psychiatric: Patient alert and oriented x3, good insight and cognition, good recent to remote recall. Lymph node survey: No cervical axillary or inguinal lymphadenopathy noted.  Lab Results:  Basic Metabolic Panel:    Component Value Date/Time   NA 130 (L) 05/11/2021 0634   K 4.8 05/11/2021 0634   CL 98 05/11/2021 0634   CO2 23 05/11/2021 0634   BUN 47 (H) 05/11/2021 0634   CREATININE 2.20 (H) 05/11/2021 0634   GLUCOSE 230 (H) 05/11/2021 0634   CALCIUM 9.0 05/11/2021 0634   CBC:    Component Value Date/Time   WBC 18.2 (H) 05/11/2021 0634   HGB 9.7 (L) 05/11/2021 0634   HCT 27.7 (L) 05/11/2021 0634   PLT 194 05/11/2021 0634   MCV 81.7 05/11/2021 0634   NEUTROABS 15.4 (H) 05/09/2021 1054   LYMPHSABS 1.3 05/09/2021 1054   MONOABS 1.5 (H) 05/09/2021 1054   EOSABS 0.0 05/09/2021 1054   BASOSABS 0.0 05/09/2021 1054    Recent Results (from the past 240 hour(s))  SARS CORONAVIRUS 2 (TAT 6-24 HRS) Nasopharyngeal Nasopharyngeal Swab     Status: None   Collection Time: 05/09/21  4:18 PM   Specimen: Nasopharyngeal Swab  Result Value Ref Range Status   SARS Coronavirus 2 NEGATIVE NEGATIVE Final    Comment: (NOTE) SARS-CoV-2 target  nucleic acids are NOT DETECTED.  The SARS-CoV-2 RNA is generally detectable in upper and lower respiratory specimens during the acute phase of infection. Negative results do not preclude SARS-CoV-2 infection, do not rule out co-infections with other pathogens, and should not be used as the sole basis for treatment or other patient management decisions. Negative results must be combined with clinical observations, patient history, and epidemiological information. The expected result is Negative.  Fact Sheet for Patients: SugarRoll.be  Fact  Sheet for Healthcare Providers: https://www.woods-mathews.com/  This test is not yet approved or cleared by the [place] FDA and  has been authorized for detection and/or diagnosis of SARS-CoV-2 by FDA under an Emergency Use Authorization (EUA). This EUA will remain  in effect (meaning this test can be used) for the duration of the COVID-19 declaration under Se ction 564(b)(1) of the Act, 21 U.S.C. section 360bbb-3(b)(1), unless the authorization is terminated or revoked sooner.  Performed at [facility], [place] [address], [place], [place] [address]     Studies/Results: No results found.  Medications: Scheduled Meds:  amLODipine  10 mg Oral Daily   aspirin EC  81 mg Oral Daily   atorvastatin  40 mg Oral QHS   cycloSPORINE  1 drop Both Eyes BID   HYDROmorphone   Intravenous Q4H   insulin aspart  0-5 Units Subcutaneous QHS   insulin aspart  0-9 Units Subcutaneous TID WC   insulin glargine  7 Units Subcutaneous QHS   levothyroxine  175 mcg Oral QAC breakfast   metoprolol succinate  50 mg Oral QHS   Netarsudil Dimesylate  1 drop Left Eye QHS   pantoprazole  40 mg Oral Daily   Continuous Infusions:  sodium chloride 75 mL/hr at 05/10/21 1751   PRN Meds:.acetaminophen **OR** acetaminophen, diphenhydrAMINE **OR** [DISCONTINUED] diphenhydrAMINE, metoCLOPramide, naloxone **AND** sodium chloride flush, ondansetron, Oxycodone HCl  Consultants: None  Procedures: None  Antibiotics: IV Cefepime started today   Assessment/Plan: Principal Problem:   Sickle cell pain crisis ([place]) Active Problems:   DM2 (diabetes mellitus, type 2) (HCC)   HTN (hypertension)   CKD (chronic kidney disease), stage III (HCC)   Leukocytosis   Hypothyroid   Sickle cell crisis (HCC)  Hb Sickle Cell Disease with crisis: Increase IVF 0.45% Saline to 100 mls/hour, continue weight based Dilaudid PCA at current dose setting, IV Toradol is contraindicated because of AKI on CKD, restart  and continue home pain medications. Monitor vitals very closely, Re-evaluate pain scale regularly, 2 L of Oxygen by [place]. Leukocytosis: Patient developed fever today. Will start empiric antibiotics today, IV Cefepime, blood culture, urine analysis, CXR. Repeat CBCD and CMP in am. Acute Kidney Injury on CKD IV: Continue gentle rehydration.  Sickle Cell Anemia: Hb is stable at baseline. No clinical indication for blood transfusion today. Chronic pain Syndrome: Restart and continue home pain medication Type 2 DM: Stable. Continue SSI and Lantus Insulin Essential HTN: Controlled. Continue homer medications. Hold Losartan for AKI.  Hypothyroidism: Continue home medications  Code Status: Full Code Family Communication: N/A Disposition Plan: Not yet ready for discharge  Gift Rueckert  If 7PM-7AM, please contact night-coverage.  05/11/2021, 1:27 PM  LOS: 1 day

## 2021-05-12 LAB — COMPREHENSIVE METABOLIC PANEL
ALT: 64 U/L — ABNORMAL HIGH (ref 0–44)
AST: 36 U/L (ref 15–41)
Albumin: 3 g/dL — ABNORMAL LOW (ref 3.5–5.0)
Alkaline Phosphatase: 87 U/L (ref 38–126)
Anion gap: 7 (ref 5–15)
BUN: 58 mg/dL — ABNORMAL HIGH (ref 8–23)
CO2: 20 mmol/L — ABNORMAL LOW (ref 22–32)
Calcium: 9.1 mg/dL (ref 8.9–10.3)
Chloride: 104 mmol/L (ref 98–111)
Creatinine, Ser: 2.57 mg/dL — ABNORMAL HIGH (ref 0.44–1.00)
GFR, Estimated: 19 mL/min — ABNORMAL LOW (ref >60)
Glucose, Bld: 162 mg/dL — ABNORMAL HIGH (ref 70–99)
Potassium: 4.8 mmol/L (ref 3.5–5.1)
Sodium: 131 mmol/L — ABNORMAL LOW (ref 135–145)
Total Bilirubin: 1 mg/dL (ref 0.3–1.2)
Total Protein: 6.4 g/dL — ABNORMAL LOW (ref 6.5–8.1)

## 2021-05-12 LAB — CBC WITH DIFFERENTIAL/PLATELET
Abs Immature Granulocytes: 0.17 10*3/uL — ABNORMAL HIGH (ref 0.00–0.07)
Basophils Absolute: 0 10*3/uL (ref 0.0–0.1)
Basophils Relative: 0 %
Eosinophils Absolute: 0.1 10*3/uL (ref 0.0–0.5)
Eosinophils Relative: 1 %
HCT: 24.3 % — ABNORMAL LOW (ref 36.0–46.0)
Hemoglobin: 8.6 g/dL — ABNORMAL LOW (ref 12.0–15.0)
Immature Granulocytes: 1 %
Lymphocytes Relative: 7 %
Lymphs Abs: 1.3 10*3/uL (ref 0.7–4.0)
MCH: 28.7 pg (ref 26.0–34.0)
MCHC: 35.4 g/dL (ref 30.0–36.0)
MCV: 81 fL (ref 80.0–100.0)
Monocytes Absolute: 2.8 10*3/uL — ABNORMAL HIGH (ref 0.1–1.0)
Monocytes Relative: 15 %
Neutro Abs: 14.2 10*3/uL — ABNORMAL HIGH (ref 1.7–7.7)
Neutrophils Relative %: 76 %
Platelets: 154 10*3/uL (ref 150–400)
RBC: 3 MIL/uL — ABNORMAL LOW (ref 3.87–5.11)
RDW: 16 % — ABNORMAL HIGH (ref 11.5–15.5)
WBC: 18.7 10*3/uL — ABNORMAL HIGH (ref 4.0–10.5)
nRBC: 1.6 % — ABNORMAL HIGH (ref 0.0–0.2)

## 2021-05-12 LAB — GLUCOSE, CAPILLARY
Glucose-Capillary: 174 mg/dL — ABNORMAL HIGH (ref 70–99)
Glucose-Capillary: 189 mg/dL — ABNORMAL HIGH (ref 70–99)
Glucose-Capillary: 169 mg/dL — ABNORMAL HIGH (ref 70–99)
Glucose-Capillary: 151 mg/dL — ABNORMAL HIGH (ref 70–99)

## 2021-05-12 MED ORDER — SODIUM CHLORIDE 0.45 % IV SOLN: INTRAVENOUS | Status: DC

## 2021-05-12 NOTE — Evaluation (Signed)
Physical Therapy Evaluation Patient Details Name: [name] MRN: [id number] DOB: 09/03/1946 Today's Date: 05/12/2021   History of Present Illness  Pt admitted with sickle cell pain crisis and with hx of DM, CAD and CKD  Clinical Impression  Pt admitted as above and presenting with functional mobility limitations 2* generalized weakness, balance deficits and slow mental processing with questionable safety awareness.  This date, pt limited by fatigue, at high risk of falls and requiring significant assist for all basic mobility tasks.  Pt hopes to progress to dc home with family assist but may need to consider follow up rehab at SNF level to maximize IND and safety - dependent on acute stay progress and assist level available at home.    Follow Up Recommendations Home health PT;SNF (Dependent on acute stay progress and assist level available at home)    Equipment Recommendations  None recommended by PT    Recommendations for Other Services OT consult     Precautions / Restrictions Precautions Precautions: Fall Restrictions Weight Bearing Restrictions: No      Mobility  Bed Mobility Overal bed mobility: Needs Assistance Bed Mobility: Sidelying to Sit;Rolling Rolling: Mod assist Sidelying to sit: Mod assist       General bed mobility comments: Increased time with cues for technique and significant assist to roll, bring LEs over EOB and bring trunk to upright    Transfers Overall transfer level: Needs assistance Equipment used: Rolling walker (2 wheeled) Transfers: Sit to/from Stand Sit to Stand: Mod assist         General transfer comment: cues for use of UEs to self assist.  Physical assist to bring wt up and fwd and to balance in standing  Ambulation/Gait Ambulation/Gait assistance: Min assist;Mod assist;+2 safety/equipment Gait Distance (Feet): 27 Feet Assistive device: Rolling walker (2 wheeled) Gait Pattern/deviations: Step-to pattern;Decreased step  length - right;Decreased step length - left;Shuffle;Trunk flexed Gait velocity: decr   General Gait Details: Increased time with cues for posture and position from RW.  Pt with short shuffling gait and multiple episodes of mild buckling at knees and instability in all directions  Stairs            Wheelchair Mobility    Modified Rankin (Stroke Patients Only)       Balance Overall balance assessment: Needs assistance Sitting-balance support: Bilateral upper extremity supported;Feet supported Sitting balance-Leahy Scale: Poor   Postural control: Posterior lean Standing balance support: Bilateral upper extremity supported Standing balance-Leahy Scale: Poor                               Pertinent Vitals/Pain Pain Assessment: Faces Faces Pain Scale: Hurts little more Pain Location: Pt reports pain but unable to localize Pain Descriptors / Indicators: Grimacing Pain Intervention(s): Limited activity within patient's tolerance;Monitored during session    Home Living Family/patient expects to be discharged to:: Private residence Living Arrangements: Spouse/significant other Available Help at Discharge: Family Type of Home: House Home Access: Stairs to enter     Home Layout: One level Home Equipment: Environmental consultant - 2 wheels Additional Comments: Pt unable to answer all questions.  Per RN, spouse reports pt has RW    Prior Function Level of Independence: Independent         Comments: Pt states IND at home and did not use RW.  Spouse reports to RN, pt has RW but was not using prior to admit     Hand Dominance  Extremity/Trunk Assessment   Upper Extremity Assessment Upper Extremity Assessment: Generalized weakness    Lower Extremity Assessment Lower Extremity Assessment: Generalized weakness       Communication   Communication: No difficulties  Cognition Arousal/Alertness: Awake/alert Behavior During Therapy: Flat affect Overall Cognitive  Status: No family/caregiver present to determine baseline cognitive functioning                                        General Comments      Exercises     Assessment/Plan    PT Assessment Patient needs continued PT services  PT Problem List Decreased strength;Decreased activity tolerance;Decreased balance;Decreased mobility;Decreased knowledge of use of DME;Pain;Decreased cognition;Decreased safety awareness       PT Treatment Interventions DME instruction;Gait training;Stair training;Functional mobility training;Therapeutic activities;Therapeutic exercise;Patient/family education;Balance training    PT Goals (Current goals can be found in the Care Plan section)  Acute Rehab PT Goals Patient Stated Goal: Willing to get up to walk PT Goal Formulation: With patient Time For Goal Achievement: 05/26/21 Potential to Achieve Goals: Good    Frequency Min 3X/week   Barriers to discharge        Co-evaluation               AM-PAC PT "6 Clicks" Mobility  Outcome Measure Help needed turning from your back to your side while in a flat bed without using bedrails?: A Lot Help needed moving from lying on your back to sitting on the side of a flat bed without using bedrails?: A Lot Help needed moving to and from a bed to a chair (including a wheelchair)?: A Lot Help needed standing up from a chair using your arms (e.g., wheelchair or bedside chair)?: A Lot Help needed to walk in hospital room?: A Lot Help needed climbing 3-5 steps with a railing? : A Lot 6 Click Score: 12    End of Session Equipment Utilized During Treatment: Gait belt Activity Tolerance: Patient limited by fatigue Patient left: in chair;with call bell/phone within reach;with chair alarm set Nurse Communication: Mobility status PT Visit Diagnosis: Difficulty in walking, not elsewhere classified (R26.2);Unsteadiness on feet (R26.81);Muscle weakness (generalized) (M62.81)    Time: [id number] PT  Time Calculation (min) (ACUTE ONLY): 26 min   Charges:   PT Evaluation $PT Eval Moderate Complexity: 1 Mod PT Treatments $Gait Training: 8-22 mins        [place] Pager [phone] Office [phone]   [facility] 05/12/2021, 5:38 PM

## 2021-05-12 NOTE — Progress Notes (Signed)
Patient ID: [name], female   DOB: 09-26-1946, 75 y.o.   MRN: [id number] Subjective: [name] is a 75 year old female with a medical history significant for sickle cell disease, anemia of chronic disease, type 2 diabetes mellitus, disease stage III, hypertension, acquired hypothyroidism that was admitted for sickle cell pain crisis.  Patient was seen sitting up in bed and eating breakfast.  She claims she is more improved today and she actually looked much better than yesterday.  She said her pain is at 4/10 in her shoulder joints, no more epigastric pain.  No nausea or vomiting.  Fever is down but still occasional low-grade up to 99.1 F.  She was asking when she is likely to be discharged.  She has no other complaint today.  Objective:  Vital signs in last 24 hours:  Vitals:   05/12/21 0839 05/12/21 0953 05/12/21 1242 05/12/21 1456  BP:  (!) 124/48  (!) 139/50  Pulse:  66  68  Resp: '18 15 14 18  '$ Temp:  98.1 F (36.7 C)  99.1 F (37.3 C)  TempSrc:  Oral  Oral  SpO2: 98% 98% 99% 97%  Weight:      Height:        Intake/Output from previous day:   Intake/Output Summary (Last 24 hours) at 05/12/2021 1532 Last data filed at 05/12/2021 1030 Gross per 24 hour  Intake 120 ml  Output --  Net 120 ml     Physical Exam: General: Alert, awake, oriented x3, in mild painful distress.  HEENT: [place]/AT PEERL, EOMI Neck: Trachea midline,  no masses, no thyromegal,y no JVD, no carotid bruit OROPHARYNX:  Moist, No exudate/ erythema/lesions.  Heart: Regular rate and rhythm, without murmurs, rubs, gallops, PMI non-displaced, no heaves or thrills on palpation.  Lungs: Clear to auscultation, no wheezing or rhonchi noted. No increased vocal fremitus resonant to percussion  Abdomen: Improved epigastric fullness, no tenderness, soft, nontender, nondistended, positive bowel sounds, no masses no hepatosplenomegaly noted..  Neuro: No focal neurological deficits noted cranial nerves II through  XII grossly intact. DTRs 2+ bilaterally upper and lower extremities. Strength 5 out of 5 in bilateral upper and lower extremities. Musculoskeletal: No warm swelling or erythema around joints, no spinal tenderness noted. Psychiatric: Patient alert and oriented x3, good insight and cognition, good recent to remote recall. Lymph node survey: No cervical axillary or inguinal lymphadenopathy noted.  Lab Results:  Basic Metabolic Panel:    Component Value Date/Time   NA 131 (L) 05/12/2021 0558   K 4.8 05/12/2021 0558   CL 104 05/12/2021 0558   CO2 20 (L) 05/12/2021 0558   BUN 58 (H) 05/12/2021 0558   CREATININE 2.57 (H) 05/12/2021 0558   GLUCOSE 162 (H) 05/12/2021 0558   CALCIUM 9.1 05/12/2021 0558   CBC:    Component Value Date/Time   WBC 18.7 (H) 05/12/2021 0558   HGB 8.6 (L) 05/12/2021 0558   HCT 24.3 (L) 05/12/2021 0558   PLT 154 05/12/2021 0558   MCV 81.0 05/12/2021 0558   NEUTROABS 14.2 (H) 05/12/2021 0558   LYMPHSABS 1.3 05/12/2021 0558   MONOABS 2.8 (H) 05/12/2021 0558   EOSABS 0.1 05/12/2021 0558   BASOSABS 0.0 05/12/2021 0558    Recent Results (from the past 240 hour(s))  SARS CORONAVIRUS 2 (TAT 6-24 HRS) Nasopharyngeal Nasopharyngeal Swab     Status: None   Collection Time: 05/09/21  4:18 PM   Specimen: Nasopharyngeal Swab  Result Value Ref Range Status   SARS Coronavirus  2 NEGATIVE NEGATIVE Final    Comment: (NOTE) SARS-CoV-2 target nucleic acids are NOT DETECTED.  The SARS-CoV-2 RNA is generally detectable in upper and lower respiratory specimens during the acute phase of infection. Negative results do not preclude SARS-CoV-2 infection, do not rule out co-infections with other pathogens, and should not be used as the sole basis for treatment or other patient management decisions. Negative results must be combined with clinical observations, patient history, and epidemiological information. The expected result is Negative.  Fact Sheet for  Patients: SugarRoll.be  Fact Sheet for Healthcare Providers: https://www.woods-mathews.com/  This test is not yet approved or cleared by the [place] FDA and  has been authorized for detection and/or diagnosis of SARS-CoV-2 by FDA under an Emergency Use Authorization (EUA). This EUA will remain  in effect (meaning this test can be used) for the duration of the COVID-19 declaration under Se ction 564(b)(1) of the Act, 21 U.S.C. section 360bbb-3(b)(1), unless the authorization is terminated or revoked sooner.  Performed at [facility], [place] [address], [place], [place] [address]   Culture, blood (routine x 2)     Status: None (Preliminary result)   Collection Time: 05/11/21  8:32 PM   Specimen: BLOOD LEFT HAND  Result Value Ref Range Status   Specimen Description   Final    BLOOD LEFT HAND Performed at [facility], [place] [address] [place], [place] [address]    Special Requests   Final    BOTTLES DRAWN AEROBIC ONLY Blood Culture adequate volume Performed at [facility] [address] [place], [place] [address]    Culture PENDING  Incomplete   Report Status PENDING  Incomplete    Studies/Results: DG Chest 2 View  Result Date: 05/11/2021 CLINICAL DATA:  Fever. EXAM: CHEST - 2 VIEW COMPARISON:  May 09, 2021 FINDINGS: The heart size remains borderline to mildly enlarged. The hila and mediastinum are unremarkable. No pneumothorax. Small bilateral pleural effusions were not seen on the previous study. Opacities underlying the effusions may represent atelectasis. No overt edema. No other acute abnormalities identified. IMPRESSION: New small bilateral pleural effusions with underlying opacities, likely atelectasis. Electronically Signed   By: Dorise Bullion III M.D   On: 05/11/2021 23:57    Medications: Scheduled Meds:  amLODipine  10 mg Oral Daily   aspirin EC  81 mg Oral Daily   atorvastatin  40 mg Oral  QHS   cycloSPORINE  1 drop Both Eyes BID   insulin aspart  0-5 Units Subcutaneous QHS   insulin aspart  0-9 Units Subcutaneous TID WC   insulin glargine  7 Units Subcutaneous QHS   levothyroxine  175 mcg Oral QAC breakfast   metoprolol succinate  50 mg Oral QHS   Netarsudil Dimesylate  1 drop Left Eye QHS   pantoprazole  40 mg Oral Daily   Continuous Infusions:  ceFEPime (MAXIPIME) IV     PRN Meds:.acetaminophen **OR** acetaminophen, metoCLOPramide, ondansetron, oxyCODONE  Consultants: None  Procedures: None  Antibiotics: IV Cefepime day 2  Assessment/Plan: Principal Problem:   Sickle cell pain crisis ([place]) Active Problems:   DM2 (diabetes mellitus, type 2) (HCC)   HTN (hypertension)   CKD (chronic kidney disease), stage III (HCC)   Leukocytosis   Hypothyroid   Sickle cell crisis (HCC)  Hb Sickle Cell Disease with crisis: Reduce IVF 0.45% Saline to 50 cc/h, discontinue weight based Dilaudid PCA since patient rarely uses it. IV Toradol is contraindicated because of AKI on CKD,  continue oral home pain medications. Monitor vitals very closely, Re-evaluate pain scale regularly, 2 L of Oxygen by [place]. Leukocytosis: Patient continues to have at a minimum low-grade fever. Will continue empiric antibiotics and probably discontinue if all cultures prove negative by tomorrow.  Follow up blood culture. Repeat CBCD and CMP in am. Acute Kidney Injury on CKD IV: Serum creatinine is worse today, chest x-ray showed bilateral pleural effusions.  We will continue gentle IV fluids hydration at 50 cc/h Sickle Cell Anemia: Hb continues to be stable at baseline. No clinical indication for blood transfusion today.  Repeat labs in a.m. Chronic pain Syndrome: Continue home pain medication as ordered. Type 2 diabetes mellitus: Blood glucose is controlled with the current regimen.  Continue SSI.  Continue to hold sulfonylurea because of AKI. Essential HTN: Controlled. Continue homer medications. Hold  Losartan for AKI.  Hypothyroidism: Continue home medications  Code Status: Full Code Family Communication: N/A Disposition Plan: Not yet ready for discharge  [name]  If 7PM-7AM, please contact night-coverage.  05/12/2021, 3:32 PM  LOS: 2 days

## 2021-05-13 LAB — CBC WITH DIFFERENTIAL/PLATELET
Abs Immature Granulocytes: 0.14 10*3/uL — ABNORMAL HIGH (ref 0.00–0.07)
Basophils Absolute: 0 10*3/uL (ref 0.0–0.1)
Basophils Relative: 0 %
Eosinophils Absolute: 0.1 10*3/uL (ref 0.0–0.5)
Eosinophils Relative: 0 %
HCT: 23.3 % — ABNORMAL LOW (ref 36.0–46.0)
Hemoglobin: 8.4 g/dL — ABNORMAL LOW (ref 12.0–15.0)
Immature Granulocytes: 1 %
Lymphocytes Relative: 7 %
Lymphs Abs: 1.3 10*3/uL (ref 0.7–4.0)
MCH: 28.6 pg (ref 26.0–34.0)
MCHC: 36.1 g/dL — ABNORMAL HIGH (ref 30.0–36.0)
MCV: 79.3 fL — ABNORMAL LOW (ref 80.0–100.0)
Monocytes Absolute: 2.6 10*3/uL — ABNORMAL HIGH (ref 0.1–1.0)
Monocytes Relative: 14 %
Neutro Abs: 14.4 10*3/uL — ABNORMAL HIGH (ref 1.7–7.7)
Neutrophils Relative %: 78 %
Platelets: 156 10*3/uL (ref 150–400)
RBC: 2.94 MIL/uL — ABNORMAL LOW (ref 3.87–5.11)
RDW: 16.6 % — ABNORMAL HIGH (ref 11.5–15.5)
WBC: 18.6 10*3/uL — ABNORMAL HIGH (ref 4.0–10.5)
nRBC: 1.5 % — ABNORMAL HIGH (ref 0.0–0.2)

## 2021-05-13 LAB — BASIC METABOLIC PANEL
Anion gap: 9 (ref 5–15)
BUN: 60 mg/dL — ABNORMAL HIGH (ref 8–23)
CO2: 19 mmol/L — ABNORMAL LOW (ref 22–32)
Calcium: 9.2 mg/dL (ref 8.9–10.3)
Chloride: 108 mmol/L (ref 98–111)
Creatinine, Ser: 2.22 mg/dL — ABNORMAL HIGH (ref 0.44–1.00)
GFR, Estimated: 23 mL/min — ABNORMAL LOW (ref >60)
Glucose, Bld: 146 mg/dL — ABNORMAL HIGH (ref 70–99)
Potassium: 4.3 mmol/L (ref 3.5–5.1)
Sodium: 136 mmol/L (ref 135–145)

## 2021-05-13 LAB — GLUCOSE, CAPILLARY
Glucose-Capillary: 147 mg/dL — ABNORMAL HIGH (ref 70–99)
Glucose-Capillary: 327 mg/dL — ABNORMAL HIGH (ref 70–99)
Glucose-Capillary: 238 mg/dL — ABNORMAL HIGH (ref 70–99)
Glucose-Capillary: 208 mg/dL — ABNORMAL HIGH (ref 70–99)

## 2021-05-13 MED ORDER — HYDROMORPHONE HCL 1 MG/ML IJ SOLN: 1.0000 mg | Freq: Three times a day (TID) | INTRAMUSCULAR | Status: DC | PRN

## 2021-05-13 MED ORDER — OXYCODONE HCL 5 MG PO TABS: 10.0000 mg | ORAL_TABLET | ORAL | Status: DC | PRN

## 2021-05-13 NOTE — Progress Notes (Signed)
Inpatient Diabetes Program Recommendations  AACE/ADA: New Consensus Statement on Inpatient Glycemic Control (2015)  Target Ranges:  Prepandial:   less than 140 mg/dL      Peak postprandial:   less than 180 mg/dL (1-2 hours)      Critically ill patients:  140 - 180 mg/dL   Lab Results  Component Value Date   GLUCAP 327 (H) 05/13/2021   HGBA1C 4.4 (L) 06/12/2019    Review of Glycemic Control Results for Kelly, [name] (MRN [id number]) as of 05/13/2021 12:17  Ref. Range 05/12/2021 08:09 05/12/2021 12:16 05/12/2021 17:14 05/12/2021 21:26 05/13/2021 07:33 05/13/2021 11:52  Glucose-Capillary Latest Ref Range: 70 - 99 mg/dL 174 (H) 189 (H) 169 (H) 151 (H) 147 (H) 327 (H)   Diabetes history: DM2 Outpatient Diabetes medications: Lantus 18 units daily, Glipizide 5 mg daily Current orders for Inpatient glycemic control: Lantus 7 units daily, Novolog 0-9 tid and 0-5 qhs  Inpatient Diabetes Program Recommendations:    Please change diet to carb modified  Post prandials not consistently elevated.  If remains inpatient and they become consistently elevated please consider:  Novolog 3 units TID with meals if eats at least 50%.    Will continue to follow while inpatient.  Thank you, Reche Dixon, RN, BSN Diabetes Coordinator Inpatient Diabetes Program [phone] (team pager from 8a-5p)

## 2021-05-13 NOTE — Progress Notes (Addendum)
Subjective: [name]. Zenz is a 75 year old female with a medical history significant for sickle cell disease, anemia of chronic disease, type 2 diabetes mellitus chronic kidney disease stage III, hypertension, and acquired hypothyroidism that was admitted for sickle cell pain crisis.  Patient states that she is feeling better today and has no new complaints.  She rates pain as 4/10 primarily to low back.  Patient has been out of bed with 1 assist today.  Her husband is at bedside.  Patient is currently sitting up eating breakfast.  She has been afebrile overnight.  She denies any headache, blurry vision, dizziness, chest pain, shortness of breath urinary symptoms, nausea, vomiting, or diarrhea.  Objective:  Vital signs in last 24 hours:  Vitals:   05/13/21 0458 05/13/21 0800 05/13/21 0813 05/13/21 0947  BP: (!) 135/54   (!) 146/50  Pulse: 77   74  Resp: '14 16 15   '$ Temp: 98.4 F (36.9 C)   98.2 F (36.8 C)  TempSrc: Oral   Oral  SpO2: 94%   96%  Weight: 69.4 kg     Height:        Intake/Output from previous day:   Intake/Output Summary (Last 24 hours) at 05/13/2021 1045 Last data filed at 05/13/2021 0949 Gross per 24 hour  Intake --  Output 1800 ml  Net -1800 ml    Physical Exam: General: Alert, awake, oriented x3, in no acute distress.  HEENT: [place]/AT PEERL, EOMI Neck: Trachea midline,  no masses, no thyromegal,y no JVD, no carotid bruit OROPHARYNX:  Moist, No exudate/ erythema/lesions.  Heart: Regular rate and rhythm, without murmurs, rubs, gallops, PMI non-displaced, no heaves or thrills on palpation.  Lungs: Clear to auscultation, no wheezing or rhonchi noted. No increased vocal fremitus resonant to percussion  Abdomen: Soft, nontender, nondistended, positive bowel sounds, no masses no hepatosplenomegaly noted..  Neuro: No focal neurological deficits noted cranial nerves II through XII grossly intact. DTRs 2+ bilaterally upper and lower extremities. Strength 5 out of 5 in  bilateral upper and lower extremities. Musculoskeletal: No warm swelling or erythema around joints, no spinal tenderness noted. Psychiatric: Patient alert and oriented x3, good insight and cognition, good recent to remote recall. Lymph node survey: No cervical axillary or inguinal lymphadenopathy noted.  Lab Results:  Basic Metabolic Panel:    Component Value Date/Time   NA 136 05/13/2021 0548   K 4.3 05/13/2021 0548   CL 108 05/13/2021 0548   CO2 19 (L) 05/13/2021 0548   BUN 60 (H) 05/13/2021 0548   CREATININE 2.22 (H) 05/13/2021 0548   GLUCOSE 146 (H) 05/13/2021 0548   CALCIUM 9.2 05/13/2021 0548   CBC:    Component Value Date/Time   WBC 18.6 (H) 05/13/2021 0548   HGB 8.4 (L) 05/13/2021 0548   HCT 23.3 (L) 05/13/2021 0548   PLT 156 05/13/2021 0548   MCV 79.3 (L) 05/13/2021 0548   NEUTROABS 14.4 (H) 05/13/2021 0548   LYMPHSABS 1.3 05/13/2021 0548   MONOABS 2.6 (H) 05/13/2021 0548   EOSABS 0.1 05/13/2021 0548   BASOSABS 0.0 05/13/2021 0548    Recent Results (from the past 240 hour(s))  SARS CORONAVIRUS 2 (TAT 6-24 HRS) Nasopharyngeal Nasopharyngeal Swab     Status: None   Collection Time: 05/09/21  4:18 PM   Specimen: Nasopharyngeal Swab  Result Value Ref Range Status   SARS Coronavirus 2 NEGATIVE NEGATIVE Final    Comment: (NOTE) SARS-CoV-2 target nucleic acids are NOT DETECTED.  The SARS-CoV-2 RNA is generally detectable  in upper and lower respiratory specimens during the acute phase of infection. Negative results do not preclude SARS-CoV-2 infection, do not rule out co-infections with other pathogens, and should not be used as the sole basis for treatment or other patient management decisions. Negative results must be combined with clinical observations, patient history, and epidemiological information. The expected result is Negative.  Fact Sheet for Patients: SugarRoll.be  Fact Sheet for Healthcare  Providers: https://www.woods-mathews.com/  This test is not yet approved or cleared by the [place] FDA and  has been authorized for detection and/or diagnosis of SARS-CoV-2 by FDA under an Emergency Use Authorization (EUA). This EUA will remain  in effect (meaning this test can be used) for the duration of the COVID-19 declaration under Se ction 564(b)(1) of the Act, 21 U.S.C. section 360bbb-3(b)(1), unless the authorization is terminated or revoked sooner.  Performed at [facility], [place] [address] [place], [place] [address]   Culture, blood (routine x 2)     Status: None (Preliminary result)   Collection Time: 05/11/21  8:32 PM   Specimen: BLOOD  Result Value Ref Range Status   Specimen Description   Final    BLOOD LEFT ANTECUBITAL Performed at [facility] [address] [place], [place] [address]    Special Requests   Final    BOTTLES DRAWN AEROBIC ONLY Blood Culture adequate volume Performed at [facility] [address], [place], [place] [address]    Culture   Final    NO GROWTH 1 DAY Performed at [facility], [place] [address] [place], [place] [address]    Report Status PENDING  Incomplete  Culture, blood (routine x 2)     Status: None (Preliminary result)   Collection Time: 05/11/21  8:32 PM   Specimen: BLOOD LEFT HAND  Result Value Ref Range Status   Specimen Description   Final    BLOOD LEFT HAND Performed at [facility], [place] [address] [place], [place] [address]    Special Requests   Final    BOTTLES DRAWN AEROBIC ONLY Blood Culture adequate volume Performed at [facility] [address], [place], [place] [address]    Culture   Final    NO GROWTH 1 DAY Performed at [facility], [place] [address] [place], [place] [address]    Report Status PENDING  Incomplete    Studies/Results: DG Chest 2 View  Result Date: 05/11/2021 CLINICAL DATA:  Fever. EXAM:  CHEST - 2 VIEW COMPARISON:  May 09, 2021 FINDINGS: The heart size remains borderline to mildly enlarged. The hila and mediastinum are unremarkable. No pneumothorax. Small bilateral pleural effusions were not seen on the previous study. Opacities underlying the effusions may represent atelectasis. No overt edema. No other acute abnormalities identified. IMPRESSION: New small bilateral pleural effusions with underlying opacities, likely atelectasis. Electronically Signed   By: Dorise Bullion III M.D   On: 05/11/2021 23:57    Medications: Scheduled Meds:  amLODipine  10 mg Oral Daily   aspirin EC  81 mg Oral Daily   atorvastatin  40 mg Oral QHS   cycloSPORINE  1 drop Both Eyes BID   insulin aspart  0-5 Units Subcutaneous QHS   insulin aspart  0-9 Units Subcutaneous TID WC   insulin glargine  7 Units Subcutaneous QHS   levothyroxine  175 mcg Oral QAC breakfast   metoprolol succinate  50 mg Oral QHS   Netarsudil Dimesylate  1 drop Left Eye QHS   pantoprazole  40 mg Oral Daily   Continuous Infusions:  sodium chloride 50 mL/hr at 05/12/21 2002   ceFEPime (MAXIPIME) IV 2 g (05/12/21 2003)   PRN Meds:.acetaminophen **OR** acetaminophen, HYDROmorphone (DILAUDID) injection, metoCLOPramide, ondansetron, oxyCODONE  Consultants: None  Procedures: None  Antibiotics: None  Assessment/Plan: Principal Problem:   Sickle cell pain crisis ([place]) Active Problems:   DM2 (diabetes mellitus, type 2) (HCC)   HTN (hypertension)   CKD (chronic kidney disease), stage III (HCC)   Leukocytosis   Hypothyroid   Sickle cell crisis (HCC)  Sickle cell disease with pain crisis: Continue IV fluids, 0.45% saline at 50 mL/h Patient says that pain increased last night and took a while for oxycodone to be effective.  We will add Dilaudid 1 mg every 8 hours as needed for severe pain. Also, oxycodone 10 mg every 6 hours as needed IV Toradol contraindicated due to AKI on CKD. Monitor vital signs very closely,  reevaluate pain scale regularly, and supplemental oxygen as needed.  Leukocytosis: WBC stable, unchanged from yesterday.  Patient afebrile overnight.  Blood cultures negative so far.  Urine culture negative.  We will continue antibiotic.  Patient afebrile and blood cultures remain negative overnight, will consider discontinuing IV antibiotics.  Repeat CBC and CMP in AM.  Acute kidney injury on CKD stage III: Serum creatinine improving.  Continue gentle hydration.  Trend creatinine.  Chronic pain syndrome: Continue home pain medications  Type 2 diabetes mellitus: Blood sugar mostly controlled.  Continue SSI.  Continue to hold sulfonylurea.  Essential hypertension: Controlled.  Continue home medications.  However, will continue to hold losartan and spironolactone due to AKI.  Hypothyroidism: Continue home medications  Code Status: Full Code Family Communication: N/A Disposition Plan: Not yet ready for discharge  [place], MSN, FNP-C Patient [facility] [address] [place], [place] [address] [phone]  If 5PM-8AM, please contact night-coverage.  05/13/2021, 10:45 AM  LOS: 3 days

## 2021-05-14 LAB — GLUCOSE, CAPILLARY
Glucose-Capillary: 174 mg/dL — ABNORMAL HIGH (ref 70–99)
Glucose-Capillary: 289 mg/dL — ABNORMAL HIGH (ref 70–99)
Glucose-Capillary: 242 mg/dL — ABNORMAL HIGH (ref 70–99)
Glucose-Capillary: 383 mg/dL — ABNORMAL HIGH (ref 70–99)

## 2021-05-14 LAB — BASIC METABOLIC PANEL
Anion gap: 9 (ref 5–15)
BUN: 48 mg/dL — ABNORMAL HIGH (ref 8–23)
CO2: 18 mmol/L — ABNORMAL LOW (ref 22–32)
Calcium: 9.4 mg/dL (ref 8.9–10.3)
Chloride: 111 mmol/L (ref 98–111)
Creatinine, Ser: 1.68 mg/dL — ABNORMAL HIGH (ref 0.44–1.00)
GFR, Estimated: 32 mL/min — ABNORMAL LOW (ref >60)
Glucose, Bld: 184 mg/dL — ABNORMAL HIGH (ref 70–99)
Potassium: 4.3 mmol/L (ref 3.5–5.1)
Sodium: 138 mmol/L (ref 135–145)

## 2021-05-14 LAB — CBC
HCT: 23.3 % — ABNORMAL LOW (ref 36.0–46.0)
Hemoglobin: 8.1 g/dL — ABNORMAL LOW (ref 12.0–15.0)
MCH: 27.8 pg (ref 26.0–34.0)
MCHC: 34.8 g/dL (ref 30.0–36.0)
MCV: 80.1 fL (ref 80.0–100.0)
Platelets: 189 10*3/uL (ref 150–400)
RBC: 2.91 MIL/uL — ABNORMAL LOW (ref 3.87–5.11)
RDW: 16.4 % — ABNORMAL HIGH (ref 11.5–15.5)
WBC: 20 10*3/uL — ABNORMAL HIGH (ref 4.0–10.5)
nRBC: 1.4 % — ABNORMAL HIGH (ref 0.0–0.2)

## 2021-05-14 MED ORDER — POLYETHYLENE GLYCOL 3350 17 G PO PACK
17.0000 g | PACK | Freq: Every day | ORAL | Status: DC
  Administered 2021-05-15: 17 g via ORAL
  Filled 2021-05-14: qty 1

## 2021-05-14 NOTE — TOC Initial Note (Signed)
Transition of Care [facility]) - Initial/Assessment Note   Patient Details  Name: Chalice Leydig MRN: [id number] Date of Birth: August 25, 1946  Transition of [facility]) CM/SW Contact:    Sherie Don, LCSW Phone Number: 05/14/2021, 12:45 PM  Clinical Narrative: Patient is a 75 year old female who was admitted for sickle cell pain crisis. PT evaluation recommended HHPT. CSW spoke with patient's husband who is agreeable to HHPT and does not have a preference for agency. CSW made referral to Amy with [facility] which was accepted. Husband updated. CSW requested HHPT orders. TOC to follow.  Expected Discharge Plan: [place] Barriers to Discharge: Continued Medical Work up  Patient Goals and CMS Choice Patient states their goals for this hospitalization and ongoing recovery are:: Return home CMS Medicare.gov Compare Post Acute Care list provided to:: Patient Represenative (must comment) Choice offered to / list presented to : Spouse, Patient  Expected Discharge Plan and Services Expected Discharge Plan: [place] In-house Referral: Clinical Social Work Post Acute Care Choice: [place] arrangements for the past 2 months: Single Family Home              DME Arranged: N/A DME Agency: NA HH Arranged: PT HH Agency: [place] Date [place]: 05/14/21 Time [place]: 1239 Representative spoke with at [place]: Amy  Prior Living Arrangements/Services Living arrangements for the past 2 months: [place] with:: Spouse Patient language and need for interpreter reviewed:: Yes Do you feel safe going back to the place where you live?: Yes      Need for Family Participation in Patient Care: No (Comment) Care giver support system in place?: Yes (comment) Criminal Activity/Legal Involvement Pertinent to Current Situation/Hospitalization: No - Comment as needed  Activities of Daily Living Home  Assistive Devices/Equipment: Eyeglasses, CBG Meter, Cane (specify quad or straight), Walker (specify type), Crutches, Wheelchair ADL Screening (condition at time of admission) Patient's cognitive ability adequate to safely complete daily activities?: Yes Is the patient deaf or have difficulty hearing?: Yes Does the patient have difficulty seeing, even when wearing glasses/contacts?: Yes Does the patient have difficulty concentrating, remembering, or making decisions?: No Patient able to express need for assistance with ADLs?: Yes Does the patient have difficulty dressing or bathing?: No Independently performs ADLs?: Yes (appropriate for developmental age) Does the patient have difficulty walking or climbing stairs?: No Weakness of Legs: None Weakness of Arms/Hands: None  Permission Sought/Granted Permission sought to share information with : Other (comment) Permission granted to share information with : Yes, Verbal Permission Granted Permission granted to share info w AGENCY: Why agencies  Emotional Assessment Orientation: : Oriented to Self, Oriented to Place, Oriented to  Time, Oriented to Situation Alcohol / Substance Use: Not Applicable Psych Involvement: No (comment)  Admission diagnosis:  Sickle cell crisis ([place]) [D57.00] Sickle cell pain crisis ([place]) [D57.00] Patient Active Problem List   Diagnosis Date Noted   Leukocytosis 05/10/2021   Sickle cell crisis ([place]) 05/10/2021   Hypothyroid    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   Right epiretinal membrane 09/27/2020   Controlled type 2 diabetes mellitus with stable proliferative retinopathy of both eyes, with long-term current use of insulin ([place]) 09/27/2020   Primary open angle glaucoma of both eyes, moderate stage 09/27/2020   Sickle cell pain crisis ([place]) 06/11/2019   Acute renal failure superimposed on stage 4 chronic kidney disease (HCC)    Chronic pain syndrome  Hemoglobin s-c disease, with acute chest  syndrome ([place]) 09/01/2017   DM2 (diabetes mellitus, type 2) ([place]) 09/01/2017   HTN (hypertension) 09/01/2017   CKD (chronic kidney disease), stage III ([place]) 09/01/2017   Acute lower UTI 09/01/2017   PCP:  Vincente Liberty, MD Pharmacy:   [facility] ([facility]), [place] [id number] [place] ([place]) [place] [address] Phone: [phone] Fax: [phone]  Readmission Risk Interventions No flowsheet data found.

## 2021-05-14 NOTE — Progress Notes (Signed)
Pharmacy Antibiotic Note  [name] is a 75 y.o. female admitted on 05/09/2021 with sickle cell pain crisis. Pharmacy has been consulted for Cefepime dosing due to fever.  Today, 05/14/21: Day #4 Cefepime Tmax 100.5 WBC elevated/increased to 20K SCr improved to 1.68, CrCl ~ 28 ml/min   Plan: Continue Cefepime 2g IV q24h Monitor renal function, cultures, clinical course, duration of therapy   Height: 5' 3.5" (161.3 cm) Weight: 67.5 kg (148 lb 13 oz) IBW/kg (Calculated) : 53.55  Temp (24hrs), Avg:98.8 F (37.1 C), Min:98.2 F (36.8 C), Max:100.5 F (38.1 C)  Recent Labs  Lab 05/10/21 0428 05/11/21 0634 05/12/21 0558 05/13/21 0548 05/14/21 0525  WBC 17.0* 18.2* 18.7* 18.6* 20.0*  CREATININE 2.02* 2.20* 2.57* 2.22* 1.68*     Estimated Creatinine Clearance: 28.3 mL/min (A) (by C-G formula based on SCr of 1.68 mg/dL (H)).    Allergies  Allergen Reactions   Bee Venom Swelling   Nsaids Other (See Comments)    Dyspepsia. Advised by Primary Provider to avoid because of Kidneys    Antimicrobials this admission: 6/11 Cefepime >>  Dose adjustments this admission: --  Microbiology results: 6/11 BCx: NGTD 6/11 UCx:  ordered, but  never collected/sent   Thank you for allowing pharmacy to be a part of this patient's care.  Luiz Ochoa 05/14/2021 7:29 AM

## 2021-05-14 NOTE — Progress Notes (Signed)
Inpatient Diabetes Program Recommendations  AACE/ADA: New Consensus Statement on Inpatient Glycemic Control (2015)  Target Ranges:  Prepandial:   less than 140 mg/dL      Peak postprandial:   less than 180 mg/dL (1-2 hours)      Critically ill patients:  140 - 180 mg/dL   Lab Results  Component Value Date   GLUCAP 174 (H) 05/14/2021   HGBA1C 4.4 (L) 06/12/2019    Review of Glycemic Control Results for [name], [name] (MRN [id number]) as of 05/14/2021 10:41  Ref. Range 05/13/2021 07:33 05/13/2021 11:52 05/13/2021 16:38 05/13/2021 22:24 05/14/2021 07:53  Glucose-Capillary Latest Ref Range: 70 - 99 mg/dL 147 (H) 327 (H) 238 (H) 208 (H) 174 (H)    Diabetes history: DM2 Outpatient Diabetes medications: Lantus 18 units daily, Glipizide 5 mg daily Current orders for Inpatient glycemic control: Lantus 7 units daily, Novolog 0-9 tid and 0-5 qhs  Inpatient Diabetes Program Recommendations:    -  Novolog 3 units TID with meals if eats at least 50%.    Will continue to follow while inpatient.  Thanks,  Tama Headings RN, MSN, BC-ADM Inpatient Diabetes Coordinator Team Pager [phone] (8a-5p)

## 2021-05-14 NOTE — Progress Notes (Addendum)
Subjective: [name]. [name] is a 75 year old female with a medical history significant for sickle cell disease, anemia of chronic disease, type 2 diabetes mellitus chronic kidney disease stage III, hypertension, and acquired hypothyroidism that was admitted for sickle cell pain crisis.  Patient states that she is having minimal pain on today.  Pain is primarily to low back.  She rates her pain as 3/10.  She denies headache, chest pain, urinary symptoms, nausea, vomiting, or diarrhea.  Patient was febrile overnight with a max temperature of 100.5. Objective:  Vital signs in last 24 hours:  Vitals:   05/14/21 1120 05/14/21 1555 05/14/21 1824 05/14/21 1835  BP: (!) 141/53 (!) 151/58  (!) 139/57  Pulse: 75 68 80 79  Resp: '16 16  16  '$ Temp: 99.2 F (37.3 C) 99.4 F (37.4 C)  99.4 F (37.4 C)  TempSrc: Oral Oral  Oral  SpO2: 100% 100% 100% 100%  Weight:      Height:        Intake/Output from previous day:   Intake/Output Summary (Last 24 hours) at 05/14/2021 1851 Last data filed at 05/14/2021 1548 Gross per 24 hour  Intake 910.23 ml  Output 1900 ml  Net -989.77 ml    Physical Exam: General: Alert, awake, oriented x3, in no acute distress.  HEENT: [place]/AT PEERL, EOMI Neck: Trachea midline,  no masses, no thyromegal,y no JVD, no carotid bruit OROPHARYNX:  Moist, No exudate/ erythema/lesions.  Heart: Regular rate and rhythm, without murmurs, rubs, gallops, PMI non-displaced, no heaves or thrills on palpation.  Lungs: Clear to auscultation, no wheezing or rhonchi noted. No increased vocal fremitus resonant to percussion  Abdomen: Soft, nontender, nondistended, positive bowel sounds, no masses no hepatosplenomegaly noted..  Neuro: No focal neurological deficits noted cranial nerves II through XII grossly intact. DTRs 2+ bilaterally upper and lower extremities. Strength 5 out of 5 in bilateral upper and lower extremities. Musculoskeletal: No warm swelling or erythema around joints, no spinal  tenderness noted. Psychiatric: Patient alert and oriented x3, good insight and cognition, good recent to remote recall. Lymph node survey: No cervical axillary or inguinal lymphadenopathy noted.  Lab Results:  Basic Metabolic Panel:    Component Value Date/Time   NA 138 05/14/2021 0525   K 4.3 05/14/2021 0525   CL 111 05/14/2021 0525   CO2 18 (L) 05/14/2021 0525   BUN 48 (H) 05/14/2021 0525   CREATININE 1.68 (H) 05/14/2021 0525   GLUCOSE 184 (H) 05/14/2021 0525   CALCIUM 9.4 05/14/2021 0525   CBC:    Component Value Date/Time   WBC 20.0 (H) 05/14/2021 0525   HGB 8.1 (L) 05/14/2021 0525   HCT 23.3 (L) 05/14/2021 0525   PLT 189 05/14/2021 0525   MCV 80.1 05/14/2021 0525   NEUTROABS 14.4 (H) 05/13/2021 0548   LYMPHSABS 1.3 05/13/2021 0548   MONOABS 2.6 (H) 05/13/2021 0548   EOSABS 0.1 05/13/2021 0548   BASOSABS 0.0 05/13/2021 0548    Recent Results (from the past 240 hour(s))  SARS CORONAVIRUS 2 (TAT 6-24 HRS) Nasopharyngeal Nasopharyngeal Swab     Status: None   Collection Time: 05/09/21  4:18 PM   Specimen: Nasopharyngeal Swab  Result Value Ref Range Status   SARS Coronavirus 2 NEGATIVE NEGATIVE Final    Comment: (NOTE) SARS-CoV-2 target nucleic acids are NOT DETECTED.  The SARS-CoV-2 RNA is generally detectable in upper and lower respiratory specimens during the acute phase of infection. Negative results do not preclude SARS-CoV-2 infection, do not rule out co-infections  with other pathogens, and should not be used as the sole basis for treatment or other patient management decisions. Negative results must be combined with clinical observations, patient history, and epidemiological information. The expected result is Negative.  Fact Sheet for Patients: SugarRoll.be  Fact Sheet for Healthcare Providers: https://www.woods-mathews.com/  This test is not yet approved or cleared by the [place] FDA and  has been  authorized for detection and/or diagnosis of SARS-CoV-2 by FDA under an Emergency Use Authorization (EUA). This EUA will remain  in effect (meaning this test can be used) for the duration of the COVID-19 declaration under Se ction 564(b)(1) of the Act, 21 U.S.C. section 360bbb-3(b)(1), unless the authorization is terminated or revoked sooner.  Performed at [facility], [place] [address] [place], [place] [address]   Culture, blood (routine x 2)     Status: None (Preliminary result)   Collection Time: 05/11/21  8:32 PM   Specimen: BLOOD  Result Value Ref Range Status   Specimen Description   Final    BLOOD LEFT ANTECUBITAL Performed at [facility] [address], [place], [place] [address]    Special Requests   Final    BOTTLES DRAWN AEROBIC ONLY Blood Culture adequate volume Performed at [facility] [address], [place], [place] [address]    Culture   Final    NO GROWTH 2 DAYS Performed at [facility] [address], [place], [place] [address]    Report Status PENDING  Incomplete  Culture, blood (routine x 2)     Status: None (Preliminary result)   Collection Time: 05/11/21  8:32 PM   Specimen: BLOOD LEFT HAND  Result Value Ref Range Status   Specimen Description   Final    BLOOD LEFT HAND Performed at [facility], [place] [address], [place], [place] [address]    Special Requests   Final    BOTTLES DRAWN AEROBIC ONLY Blood Culture adequate volume Performed at [facility] [address] [place], [place] [address]    Culture   Final    NO GROWTH 2 DAYS Performed at [facility] [address] [place], [place] [address]    Report Status PENDING  Incomplete    Studies/Results: No results found.  Medications: Scheduled Meds:  amLODipine  10 mg Oral Daily   aspirin EC  81 mg Oral Daily   atorvastatin  40 mg Oral QHS   cycloSPORINE  1 drop Both Eyes BID   insulin aspart  0-5  Units Subcutaneous QHS   insulin aspart  0-9 Units Subcutaneous TID WC   insulin glargine  7 Units Subcutaneous QHS   levothyroxine  175 mcg Oral QAC breakfast   metoprolol succinate  50 mg Oral QHS   Netarsudil Dimesylate  1 drop Left Eye QHS   pantoprazole  40 mg Oral Daily   polyethylene glycol  17 g Oral Daily   Continuous Infusions:  sodium chloride 50 mL/hr at 05/14/21 1019   ceFEPime (MAXIPIME) IV 2 g (05/13/21 2020)   PRN Meds:.acetaminophen **OR** acetaminophen, HYDROmorphone (DILAUDID) injection, metoCLOPramide, ondansetron, oxyCODONE  Consultants: None  Procedures: None  Antibiotics: None  Assessment/Plan: Principal Problem:   Sickle cell pain crisis ([place]) Active Problems:   DM2 (diabetes mellitus, type 2) (HCC)   HTN (hypertension)   CKD (chronic kidney disease), stage III (HCC)   Leukocytosis   Hypothyroid   Sickle cell crisis (HCC)  Sickle cell disease  with pain crisis: Continue IV fluids, 0.45% saline at 50 mL/h Patient says that pain increased last night and took a while for oxycodone to be effective.  We will add Dilaudid 1 mg every 8 hours as needed for severe pain. Also, oxycodone 10 mg every 6 hours as needed IV Toradol contraindicated due to AKI on CKD. Monitor vital signs very closely, reevaluate pain scale regularly, and supplemental oxygen as needed.  Leukocytosis: WBC stable.  Max temperature overnight 100.5.[place] Kitchen  Patient afebrile overnight.  Blood cultures negative so far.  Urine culture negative.  We will continue antibiotic.  Patient afebrile and blood cultures remain negative overnight, will consider discontinuing IV antibiotics.  Repeat CBC and CMP in AM.  Acute kidney injury on CKD stage III: Serum creatinine is consistent with patient's baseline.  Continue gentle hydration.  Trend creatinine.  Chronic pain syndrome: Continue home pain medications  Type 2 diabetes mellitus: Blood sugar stable.  Continue SSI.  Continue to hold  sulfonylurea.  Essential hypertension: Controlled.  Continue home medications.  However, will continue to hold losartan and spironolactone due to AKI.  Hypothyroidism: Continue home medications  Code Status: Full Code Family Communication: N/A Disposition Plan: Not yet ready for discharge  [place], MSN, FNP-C Patient [facility] [address] [place], [place] [address] [phone]  If 5PM-8AM, please contact night-coverage.  05/14/2021, 6:51 PM  LOS: 4 days

## 2021-05-14 NOTE — Plan of Care (Signed)
  Problem: Self-Care: Goal: Ability to incorporate actions that prevent/reduce pain crisis will improve Outcome: Progressing

## 2021-05-14 NOTE — Progress Notes (Signed)
Physical Therapy Treatment Patient Details Name: [name] MRN: [id number] DOB: Jul 23, 1946 Today's Date: 05/14/2021    History of Present Illness Pt admitted with sickle cell pain crisis and with hx of DM, CAD and CKD    PT Comments    Pt in bed on 4 lts sats 98%.  Pt c/o fatigue after NT gave her a wash up.  Assisted OOB was difficult.  General bed mobility comments: required increased time with difficulty completing scooting to EOB then required increased B LE back onto bed due to fatigue after amb.  General transfer comment: cues for use of UEs to self assist.  Physical assist to bring wt up and fwd and to balance in standing.  Unsteady.  Shaky. General Gait Details: very unsteady gait esp with initail steps present with dyskenesia and difficulty achieving midline posture.  Unsteady with turns as well.  HIGH FALL RISK.  would rec pt NOT amb on her own. Prior pt was Indep and did walk on a walker.    Follow Up Recommendations  Home health PT + 24/7 care if Spouse is capable  otherwise SNF     Equipment Recommendations  None recommended by PT    Recommendations for Other Services       Precautions / Restrictions Precautions Precautions: Fall    Mobility  Bed Mobility Overal bed mobility: Needs Assistance Bed Mobility: Supine to Sit;Sit to Supine   Sidelying to sit: Min assist;Mod assist   Sit to supine: Mod assist;Max assist   General bed mobility comments: required increased time with difficulty completing scooting to EOB then required increased B LE back onto bed due to fatigue after amb    Transfers Overall transfer level: Needs assistance Equipment used: Rolling walker (2 wheeled) Transfers: Sit to/from Stand Sit to Stand: Min assist;Mod assist         General transfer comment: cues for use of UEs to self assist.  Physical assist to bring wt up and fwd and to balance in standing.  Unsteady.  Shaky.  Ambulation/Gait Ambulation/Gait assistance: Min  assist;Mod assist Gait Distance (Feet): 24 Feet Assistive device: Rolling walker (2 wheeled) Gait Pattern/deviations: Step-to pattern;Decreased step length - right;Decreased step length - left;Shuffle;Trunk flexed Gait velocity: decreased   General Gait Details: very unsteady gait esp with initail steps present with dyskenesia and difficulty achieving midline posture.  Unsteady with turns as well.  HIGH FALL RISK.  would rec pt NOT amb on her own.   Stairs             Wheelchair Mobility    Modified Rankin (Stroke Patients Only)       Balance                                            Cognition Arousal/Alertness: Awake/alert Behavior During Therapy: Flat affect Overall Cognitive Status: Within Functional Limits for tasks assessed                                 General Comments: AxO x 3 very pleasant but flat responce with minimal info and repeats "I'm fine"      Exercises      General Comments        Pertinent Vitals/Pain Pain Assessment: No/denies pain Pain Location: pt grimacing but denies pain Pain Descriptors /  Indicators: Grimacing    Home Living                      Prior Function            PT Goals (current goals can now be found in the care plan section) Progress towards PT goals: Progressing toward goals    Frequency    Min 3X/week      PT Plan Current plan remains appropriate    Co-evaluation              AM-PAC PT "6 Clicks" Mobility   Outcome Measure  Help needed turning from your back to your side while in a flat bed without using bedrails?: A Lot Help needed moving from lying on your back to sitting on the side of a flat bed without using bedrails?: A Lot Help needed moving to and from a bed to a chair (including a wheelchair)?: A Lot Help needed standing up from a chair using your arms (e.g., wheelchair or bedside chair)?: A Lot Help needed to walk in hospital room?: A  Lot Help needed climbing 3-5 steps with a railing? : A Lot 6 Click Score: 12    End of Session Equipment Utilized During Treatment: Gait belt Activity Tolerance: Patient limited by fatigue Patient left: in bed;with bed alarm set;with call bell/phone within reach Nurse Communication: Mobility status PT Visit Diagnosis: Difficulty in walking, not elsewhere classified (R26.2);Unsteadiness on feet (R26.81);Muscle weakness (generalized) (M62.81)     Time: 1600-1620 PT Time Calculation (min) (ACUTE ONLY): 20 min  Charges:  $Gait Training: 8-22 mins                     Rica Koyanagi  PTA Acute  Rehabilitation Services Pager      [phone] Office      [phone]

## 2021-05-14 NOTE — Care Management Important Message (Signed)
Important Message  Patient Details IM Letter given to the Patient. Name: [name] MRN: [id number] Date of Birth: [birth year]   Medicare Important Message Given:  Yes     Kerin Salen 05/14/2021, 9:35 AM

## 2021-05-15 DIAGNOSIS — R509 Fever, unspecified: Secondary | ICD-10-CM

## 2021-05-15 LAB — BASIC METABOLIC PANEL
Anion gap: 7 (ref 5–15)
BUN: 38 mg/dL — ABNORMAL HIGH (ref 8–23)
CO2: 20 mmol/L — ABNORMAL LOW (ref 22–32)
Calcium: 9.5 mg/dL (ref 8.9–10.3)
Chloride: 111 mmol/L (ref 98–111)
Creatinine, Ser: 1.53 mg/dL — ABNORMAL HIGH (ref 0.44–1.00)
GFR, Estimated: 36 mL/min — ABNORMAL LOW (ref >60)
Glucose, Bld: 179 mg/dL — ABNORMAL HIGH (ref 70–99)
Potassium: 4.2 mmol/L (ref 3.5–5.1)
Sodium: 138 mmol/L (ref 135–145)

## 2021-05-15 LAB — CBC
HCT: 23 % — ABNORMAL LOW (ref 36.0–46.0)
Hemoglobin: 8.2 g/dL — ABNORMAL LOW (ref 12.0–15.0)
MCH: 28.3 pg (ref 26.0–34.0)
MCHC: 35.7 g/dL (ref 30.0–36.0)
MCV: 79.3 fL — ABNORMAL LOW (ref 80.0–100.0)
Platelets: 209 10*3/uL (ref 150–400)
RBC: 2.9 MIL/uL — ABNORMAL LOW (ref 3.87–5.11)
RDW: 16.5 % — ABNORMAL HIGH (ref 11.5–15.5)
WBC: 17.3 10*3/uL — ABNORMAL HIGH (ref 4.0–10.5)
nRBC: 1.7 % — ABNORMAL HIGH (ref 0.0–0.2)

## 2021-05-15 LAB — GLUCOSE, CAPILLARY
Glucose-Capillary: 175 mg/dL — ABNORMAL HIGH (ref 70–99)
Glucose-Capillary: 308 mg/dL — ABNORMAL HIGH (ref 70–99)

## 2021-05-15 MED ORDER — LOSARTAN POTASSIUM 100 MG PO TABS: 50.0000 mg | ORAL_TABLET | Freq: Every day | ORAL | 0 refills | Status: AC

## 2021-05-15 MED ORDER — OXYCODONE HCL 10 MG PO TABS: 5.0000 mg | ORAL_TABLET | Freq: Four times a day (QID) | ORAL | 0 refills | Status: AC | PRN

## 2021-05-15 NOTE — Discharge Summary (Signed)
Physician Discharge Summary  [name] [id number] DOB: September 19, 1946 DOA: 05/09/2021  PCP: Vincente Liberty, MD  Admit date: 05/09/2021  Discharge date: 05/15/2021  Discharge Diagnoses:  Principal Problem:   Sickle cell pain crisis ([place]) Active Problems:   DM2 (diabetes mellitus, type 2) ([place])   HTN (hypertension)   CKD (chronic kidney disease), stage III (H[facility])   Leukocytosis   Hypothyroid   Sickle cell crisis [facility])   Discharge Condition: Stable  Disposition:   Follow-up Information     [facility] Follow up.   Specialty: [place] Why: PT (agency is now called [facility]) Contact information: [place] [phone]         Vincente Liberty, MD Follow up in 1 week(s).   Specialty: Pulmonary Disease Contact information: [place] [address] [phone]         Charolette Forward, MD Follow up.   Specialty: Cardiology Contact information: [place] [address] [phone]         Dorena Dew, FNP Follow up.   Specialty: Family Medicine Why: Sickle cell center triage is Monday through Friday from 8 AM-12.  Call [phone] for triage.  We typically admit for the day and discharge at 430 for acute sickle cell pain crisis. Contact information: [address] [place] [address] [phone]                Pt is discharged home in good condition and is to follow up with Vincente Liberty, MD this week to have labs evaluated. 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 The patient reports 2 days of bilateral shoulder pain associated with thoracic back pain, discomfort as well as bilateral anterior chest wall discomfort in the distribution, intensity, and quality similar to that which she experienced with previous episodes of sickle cell pain crisis.  She denies any associated shortness of breath, palpitations, diaphoresis, nausea, vomiting, dizziness, presyncope, or erythema.  No recent travel or known COVID-19 exposures.  She reports that her chest discomfort is nonexertional, nonpleuritic, and nonpositional, and notes that the intensity, location, neck quality of her chest discomfort is very similar to that which she experienced at times with previous sickle cell pain crisis.  Denies any associated subjective fever, chills, rigors, or generalized myalgias.  Denies any recent headache, neck stiffness, rhinitis, rhinorrhea, sore throat, wheezing, cough, abdominal pain, diarrhea, or rash.  No associated with any recent dysuria, gross hematuria, or change in urinary frequency or urgency. The patient conveys that she has been spending some additional time outside over the last several days in the setting of increased ambient temperatures in the absence of compensatory increase in water consumption.  Denies any recent  or current alcohol consumption, and also denies any recreational drug use. In the context of the above, the patient reported to [facility] emergency department yesterday for further evaluation and management of her bilateral shoulder pain, back pain, discomfort at which she  was managed with IV analgesia following which she experienced some improvement in the associated discomfort prompting her to be discharged home from the ER yesterday.  However, she reports ensuing subsequent worsening of the above discomfort, prompting her to present back to [facility] emergency department today for further evaluation and management thereof. In the setting of 2 days of the above discomfort, the patient reports that she is missed multiple doses of her home oral medications as well as insulin, specifically noting that her most recent dose of basal Lantus 18 units subcutaneously nightly occurred on 05/07/2021.  She also reports missing doses of her losartan, Toprol-XL, Norvasc, and spironolactone in the interval.  Hospital Course:  Sickle cell disease with pain crisis: Patient was admitted to telemetry with sickle cell pain crisis.  Pain was primarily to bilateral shoulders and bilateral anterior chest wall.  Pain intensity has since resolved.  Patient is not having any pain on today.  She last had oxycodone on last night with maximum relief. Patient was initially treated with IV Dilaudid and transition to oral medications.  She will discharge home with oxycodone 10 mg every 6 hours as needed for severe breakthrough pain #30.  Reviewed PDMP prior to prescribing opiate medications, no inconsistencies noted.  Patient to follow-up with PCP in 1 week for medication management.  Fever of unknown origin and leukocytosis: During admission, patient spiked temperature.  No source identified.  Urine culture negative.  Blood cultures remain negative throughout admission.  COVID-19 negative.  IV cefepime was initiated and patient completed 3 days.  No further antibiotics warranted at discharge.  WBCs trended down appropriately.  Patient to follow-up with PCP in 1 week.  At that time, repeat CBC with differential.  Acute kidney injury superimposed on chronic kidney disease stage IIIb: Patient's baseline  creatinine is 1.4-1.6.  On admission, creatinine was markedly elevated.  Patient was extremely dehydrated.  Spironolactone was held at that time.  We will continue to hold spironolactone until patient follows up with her cardiologist.  Reviewed medications at length with patient and husband, they expressed understanding. Creatinine returned to baseline and patient will follow-up with her nephrologist.  Also, patient was reminded to continue to avoid all nephrotoxins.  Also, patient was reminded of the importance of hydrating consistently, especially in the setting of sickle cell disease.  Type 2 diabetes mellitus: Patient has a long history of type 2 diabetes mellitus.  It has been well controlled.  Patient will resume her home medications of Lantus 18 units at bedtime and glimepiride XL.  Patient will follow-up with Dr. Katherine Roan in 1 week.  Attempted to notify Dr. Oralia Rud office, office is only open 3 days/week and no answering service available.  Essential hypertension: Blood pressure remained stable throughout admission.  Losartan held due to acute kidney injury.  Losartan lowered prior to discharge to 50 mg daily.  Discussed with patient at length.  Also, will continue to hold spironolactone.  Patient advised to schedule first available appointment with Dr. Terrence Dupont, cardiologist.  Physical deconditioning: It was determined that patient is extremely deconditioned throughout admission per PT evaluation.  Patient will discharge home with home health PT.  Connection has already been established per transitional care team.  The patient and her husband were  both given opportunity to ask questions.  Reviewed AVS with patient.  She expressed understanding of changes and follow-up appointments. She is alert, oriented, and ambulating without assistance.  Ambulating pulse ox shows that oxygen saturation was maintained above 90%. Patient was admitted for sickle cell pain crisis and managed appropriately  with IVF, IV Dilaudid via PCA and IV Toradol, as well as other adjunct therapies per sickle cell pain management protocols.  Patient was therefore discharged home today in a hemodynamically stable condition.  Mrs. Ertl will follow-up with PCP within 1 week of this discharge. Patient was counseled extensively about nonpharmacologic means of pain management, patient verbalized understanding and was appreciative of  the care received during this admission.   We discussed the need for good hydration, monitoring of hydration status, avoidance of heat, cold, stress, and infection triggers. We discussed the need to be adherent with taking home medications. Patient was reminded of the need to seek medical attention immediately if any symptom of bleeding, anemia, or infection occurs.  Discharge Exam: Vitals:   05/15/21 1500 05/15/21 1529  BP:    Pulse:    Resp:    Temp:    SpO2: 94% 96%   Vitals:   05/15/21 1006 05/15/21 1406 05/15/21 1500 05/15/21 1529  BP: (!) 126/52 (!) 149/56    Pulse: 65 72    Resp: 15 16    Temp: 99.1 F (37.3 C) 99.7 F (37.6 C)    TempSrc: Oral Oral    SpO2: 100% 99% 94% 96%  Weight:      Height:        General appearance : Awake, alert, not in any distress. Speech Clear. Not toxic looking HEENT: Atraumatic and Normocephalic, pupils equally reactive to light and accomodation Neck: Supple, no JVD. No cervical lymphadenopathy.  Chest: Good air entry bilaterally, no added sounds  CVS: S1 S2 regular, no murmurs.  Abdomen: Bowel sounds present, Non tender and not distended with no gaurding, rigidity or rebound. Extremities: B/L Lower Ext shows no edema, both legs are warm to touch Neurology: Awake alert, and oriented X 3, CN II-XII intact, Non focal Skin: No Rash  Discharge Instructions   Allergies as of 05/15/2021       Reactions   Bee Venom Swelling   Nsaids Other (See Comments)   Dyspepsia. Advised by Primary Provider to avoid because of Kidneys         Medication List     STOP taking these medications    spironolactone 25 MG tablet Commonly known as: ALDACTONE       TAKE these medications    acetaminophen 500 MG tablet Commonly known as: TYLENOL Take 1,000 mg by mouth every 6 (six) hours as needed for mild pain or moderate pain.   amLODipine 10 MG tablet Commonly known as: NORVASC Take 10 mg by mouth daily.   aspirin EC 81 MG tablet Take 81 mg by mouth daily.   atorvastatin 40 MG tablet Commonly known as: LIPITOR Take 40 mg by mouth at bedtime.   cycloSPORINE 0.05 % ophthalmic emulsion Commonly known as: RESTASIS Place 1 drop into both eyes 2 (two) times daily.   ferrous sulfate 325 (65 FE) MG tablet Take 325 mg by mouth daily with breakfast.   folic acid 1 MG tablet Commonly known as: FOLVITE Take 1 mg by mouth daily.   glipiZIDE 5 MG 24 hr tablet Commonly known as: GLUCOTROL XL Take 5 mg by mouth 3 (three) times a week. Mon, Wed, Fri  insulin glargine 100 UNIT/ML injection Commonly known as: LANTUS Inject 0.18 mLs (18 Units total) into the skin at bedtime.   levothyroxine 175 MCG tablet Commonly known as: SYNTHROID Take 175 mcg by mouth daily before breakfast.   losartan 100 MG tablet Commonly known as: COZAAR Take 0.5 tablets (50 mg total) by mouth daily. What changed: how much to take   Lumigan 0.01 % Soln Generic drug: bimatoprost Place 1 drop into both eyes at bedtime. Separate by at least 10 minutes from other intraocular pressure reducing ophthalmic drugs   meclizine 25 MG tablet Commonly known as: ANTIVERT Take 25 mg by mouth 3 (three) times daily as needed for dizziness.   metoprolol succinate 50 MG 24 hr tablet Commonly known as: TOPROL-XL Take 50 mg by mouth at bedtime. Take with or immediately following a meal.   Oxycodone HCl 10 MG Tabs Take 0.5 tablets (5 mg total) by mouth every 6 (six) hours as needed. What changed:  how much to take reasons to take this   Rhopressa 0.02  % Soln Generic drug: Netarsudil Dimesylate Place 1 drop into the left eye at bedtime.   vitamin C 500 MG tablet Commonly known as: ASCORBIC ACID Take 500 mg by mouth daily.   Vitamin D3 1.25 MG (50000 UT) Caps Take 50,000 capsules by mouth once a week.        The results of significant diagnostics from this hospitalization (including imaging, microbiology, ancillary and laboratory) are listed below for reference.    Significant Diagnostic Studies: DG Chest 2 View  Result Date: 05/11/2021 CLINICAL DATA:  Fever. EXAM: CHEST - 2 VIEW COMPARISON:  May 09, 2021 FINDINGS: The heart size remains borderline to mildly enlarged. The hila and mediastinum are unremarkable. No pneumothorax. Small bilateral pleural effusions were not seen on the previous study. Opacities underlying the effusions may represent atelectasis. No overt edema. No other acute abnormalities identified. IMPRESSION: New small bilateral pleural effusions with underlying opacities, likely atelectasis. Electronically Signed   By: Dorise Bullion III M.D   On: 05/11/2021 23:57   DG Chest 2 View  Result Date: 05/09/2021 CLINICAL DATA:  Sickle cell pain in the chest since yesterday. EXAM: CHEST - 2 VIEW COMPARISON:  May 08, 2021 FINDINGS: The heart size and mediastinal contours are within normal limits. Both lungs are clear. The visualized skeletal structures are unremarkable. IMPRESSION: No active cardiopulmonary disease. Electronically Signed   By: Abelardo Diesel M.D.   On: 05/09/2021 13:51   CT HEAD WO CONTRAST  Result Date: 05/03/2021 CLINICAL DATA:  Dizziness EXAM: CT HEAD WITHOUT CONTRAST TECHNIQUE: Contiguous axial images were obtained from the base of the skull through the vertex without intravenous contrast. COMPARISON:  10/26/2017 FINDINGS: Brain: There is no mass, hemorrhage or extra-axial collection. The size and configuration of the ventricles and extra-axial CSF spaces are normal. The brain parenchyma is normal, without  acute or chronic infarction. Vascular: No abnormal hyperdensity of the major intracranial arteries or dural venous sinuses. No intracranial atherosclerosis. Skull: The visualized skull base, calvarium and extracranial soft tissues are normal. Sinuses/Orbits: No fluid levels or advanced mucosal thickening of the visualized paranasal sinuses. No mastoid or middle ear effusion. The orbits are normal. IMPRESSION: Normal head CT. Electronically Signed   By: Ulyses Jarred M.D.   On: 05/03/2021 21:13   DG Chest Portable 1 View  Result Date: 05/08/2021 CLINICAL DATA:  Chest pain, reports sickle cell crisis. EXAM: PORTABLE CHEST 1 VIEW COMPARISON:  Radiograph 03/30/2021, chest CT 06/23/2016 FINDINGS:  The heart size and mediastinal contours are within normal limits. No focal airspace disease. No pleural effusion or pneumothorax. 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 IMPRESSION: No evidence of acute cardiopulmonary disease. Electronically Signed   By: Maurine Simmering   On: 05/08/2021 16:47    Microbiology: Recent Results (from the past 240 hour(s))  SARS CORONAVIRUS 2 (TAT 6-24 HRS) Nasopharyngeal Nasopharyngeal Swab     Status: None   Collection Time: 05/09/21  4:18 PM   Specimen: Nasopharyngeal Swab  Result Value Ref Range Status   SARS Coronavirus 2 NEGATIVE NEGATIVE Final    Comment: (NOTE) SARS-CoV-2 target nucleic acids are NOT DETECTED.  The SARS-CoV-2 RNA is generally detectable in upper and lower respiratory specimens during the acute phase of infection. Negative results do not preclude SARS-CoV-2 infection, do not rule out co-infections with other pathogens, and should not be used as the sole basis for treatment or other patient management decisions. Negative results must be combined with clinical observations, patient history, and epidemiological information. The expected result is Negative.  Fact Sheet for Patients: SugarRoll.be  Fact  Sheet for Healthcare Providers: https://www.woods-mathews.com/  This test is not yet approved or cleared by the [place] FDA and  has been authorized for detection and/or diagnosis of SARS-CoV-2 by FDA under an Emergency Use Authorization (EUA). This EUA will remain  in effect (meaning this test can be used) for the duration of the COVID-19 declaration under Se ction 564(b)(1) of the Act, 21 U.S.C. section 360bbb-3(b)(1), unless the authorization is terminated or revoked sooner.  Performed at [facility], [place] [address], [place], [place] [address]   Culture, blood (routine x 2)     Status: None (Preliminary result)   Collection Time: 05/11/21  8:32 PM   Specimen: BLOOD  Result Value Ref Range Status   Specimen Description   Final    BLOOD LEFT ANTECUBITAL Performed at [facility] [address] [place], [place] [address]    Special Requests   Final    BOTTLES DRAWN AEROBIC ONLY Blood Culture adequate volume Performed at [facility] [address] [place], [place] [address]    Culture   Final    NO GROWTH 3 DAYS Performed at [facility], [place] [address] [place], [place] [address]    Report Status PENDING  Incomplete  Culture, blood (routine x 2)     Status: None (Preliminary result)   Collection Time: 05/11/21  8:32 PM   Specimen: BLOOD LEFT HAND  Result Value Ref Range Status   Specimen Description   Final    BLOOD LEFT HAND Performed at [facility], [place] [address], [place], [place] [address]    Special Requests   Final    BOTTLES DRAWN AEROBIC ONLY Blood Culture adequate volume Performed at [facility] [address] [place], [place] [address]    Culture   Final    NO GROWTH 3 DAYS Performed at [facility], [place] [address], [place], [place] [address]    Report Status PENDING  Incomplete     Labs: Basic Metabolic Panel: Recent Labs  Lab 05/10/21 0428  05/11/21 0634 05/12/21 0558 05/13/21 0548 05/14/21 0525 05/15/21 0554  NA 137 130* 131* 136 138 138  K 4.4 4.8 4.8 4.3 4.3 4.2  CL 104 98 104 108 111 111  CO2 25 23 20* 19* 18* 20*  GLUCOSE 215* 230* 162* 146* 184* 179*  BUN 34* 47* 58* 60* 48* 38*  CREATININE 2.02* 2.20* 2.57* 2.22* 1.68* 1.53*  CALCIUM 9.6 9.0 9.1 9.2 9.4 9.5  MG 2.2  2.1  --   --   --   --   --    Liver Function Tests: Recent Labs  Lab 05/09/21 1054 05/10/21 0428 05/12/21 0558  AST 22 30 36  ALT 23 25 64*  ALKPHOS 129* 133* 87  BILITOT 1.3* 1.5* 1.0  PROT 8.6* 7.9 6.4*  ALBUMIN 4.5 4.0 3.0*   No results for input(s): LIPASE, AMYLASE in the last 168 hours. No results for input(s): AMMONIA in the last 168 hours. CBC: Recent Labs  Lab 05/09/21 1054 05/10/21 0428 05/11/21 0634 05/12/21 0558 05/13/21 0548 05/14/21 0525 05/15/21 0554  WBC 18.3*   < > 18.2* 18.7* 18.6* 20.0* 17.3*  NEUTROABS 15.4*  --   --  14.2* 14.4*  --   --   HGB 10.6*   < > 9.7* 8.6* 8.4* 8.1* 8.2*  HCT 29.5*   < > 27.7* 24.3* 23.3* 23.3* 23.0*  MCV 80.6   < > 81.7 81.0 79.3* 80.1 79.3*  PLT 264   < > 194 154 156 189 209   < > = values in this interval not displayed.   Cardiac Enzymes: No results for input(s): CKTOTAL, CKMB, CKMBINDEX, TROPONINI in the last 168 hours. BNP: Invalid input(s): POCBNP CBG: Recent Labs  Lab 05/14/21 1242 05/14/21 1835 05/14/21 2227 05/15/21 0726 05/15/21 1158  GLUCAP 289* 242* 383* 175* 308*    Time coordinating discharge: 50 minutes  Signed:  Donia Pounds  APRN, MSN, FNP-C [facility] [address] [place], [place] [address] [phone]  [facility] 05/15/2021, 5:09 PM

## 2021-05-15 NOTE — Progress Notes (Signed)
Inpatient Diabetes Program Recommendations  AACE/ADA: New Consensus Statement on Inpatient Glycemic Control (2015)  Target Ranges:  Prepandial:   less than 140 mg/dL      Peak postprandial:   less than 180 mg/dL (1-2 hours)      Critically ill patients:  140 - 180 mg/dL   Lab Results  Component Value Date   GLUCAP 175 (H) 05/15/2021   HGBA1C 4.4 (L) 06/12/2019   Results for MIXTLI, TOWLES (MRN [id number]) as of 05/15/2021 12:35  Ref. Range 05/15/2021 07:26 05/15/2021 11:58  Glucose-Capillary Latest Ref Range: 70 - 99 mg/dL 175 (H) 308 (H)     Current orders for Inpatient glycemic control:  Lantus 7 units TID Novolog 0-9 units TID and 0-5 units QHS  Inpatient Diabetes Program Recommendations:    Noted Lantus was added last evening.   Post prandials continue to be significantly elevated.  Please consider:  Novolog 4 units TID with meals if eats at least 50%.  Will continue to follow while inpatient.  Thank you, Reche Dixon, RN, BSN Diabetes Coordinator Inpatient Diabetes Program [phone] (team pager from 8a-5p)

## 2021-05-15 NOTE — Plan of Care (Signed)
[name] slept through the night; still easy to awake but lethargic; able to take meds; no s/s of acute distress or pain reported or observed; call light within reach and bed at lowest position for safety.

## 2021-05-17 LAB — CULTURE, BLOOD (ROUTINE X 2)
Culture: NO GROWTH
Culture: NO GROWTH
Special Requests: ADEQUATE
Special Requests: ADEQUATE

## 2021-05-22 ENCOUNTER — Encounter: Payer: Self-pay | Admitting: Neurology

## 2021-05-22 ENCOUNTER — Ambulatory Visit (INDEPENDENT_AMBULATORY_CARE_PROVIDER_SITE_OTHER): Payer: 59 | Admitting: Neurology

## 2021-05-22 VITALS — BP 131/69 | HR 74 | Ht 63.0 in | Wt 144.0 lb

## 2021-05-22 DIAGNOSIS — H9012 Conductive hearing loss, unilateral, left ear, with unrestricted hearing on the contralateral side: Secondary | ICD-10-CM | POA: Diagnosis not present

## 2021-05-22 DIAGNOSIS — H6122 Impacted cerumen, left ear: Secondary | ICD-10-CM | POA: Insufficient documentation

## 2021-05-22 DIAGNOSIS — D5700 Hb-SS disease with crisis, unspecified: Secondary | ICD-10-CM | POA: Diagnosis not present

## 2021-05-22 DIAGNOSIS — R42 Dizziness and giddiness: Secondary | ICD-10-CM

## 2021-05-22 NOTE — Progress Notes (Signed)
[facility]  PATIENT: [name] DOB: 14-Feb-1946  REFERRING DOCTOR OR PCP: Vincente Liberty, MD SOURCE: Patient, notes from primary care, ED notes, notes from ENT (2021)  _________________________________   HISTORICAL  CHIEF COMPLAINT:  Chief Complaint  Patient presents with   New Patient (Initial Visit)    RM 13 w/ husband, [name] Just got out of the hospital [facility]) about a week ago. In [facility] in office today. Ambulates with cane. Cannot ambulate long distances. She was bedridden in hospital. Doing in home PT currently twice. She is feeling better. No complaints at this time. Dx w/ Sickle Cell disease years ago.    HISTORY OF PRESENT ILLNESS:  I had the pleasure of seeing your patient, Ineke [name], at [facility] for neurologic consultation regarding her vertigo.  She is a 75 year old woman with sickle cell anemia who had the onset of vertigo over the last last couple months..  She described a spinning sensation.  A typical spell would last 60 to 90 seconds and she felt the room spinning around her.  It seldom occurred while sitting still.  However, the episodes occur in bed if she rolled over or when she got up from the bed or chair.   Her left ear feels stuffed up over the last month..  She notes reduced hearing on the left.  She had a sickle cell crisis 3 weeks ago and presented to [facility].   She received IV hydration but did not require a transfusion.   She was placed on meclizine due to her vertigo but had no benefit.    However, over the last 2 weeks, the dizziness has slowly improved though they still occur.          In 2018 when she had similar vertigo symptoms and they improved with meclizine.    A year ago, she was noting more hair loss on the left and she was found to have excessive wax in the left ear canal.  She was referred ENT (Dr. Fredric Dine) and had wax removed with benefit in her hearing.  While in the  hospital she was bedridden and she felt her walking was worse when she was discharged.   She does okay around the house and she can hold onto furniture but is using a wheelchair when outside the house.  Images reviewed today. Head CT 05/03/2021 shows a normal brain.  She does have cerumen in the left  ear canal though it did not appear to be impacted.  MRI 10/26/2017 was normal for age showing mild chronic microvascular ischemic changes.  Internal auditory canals appear normal.  REVIEW OF SYSTEMS: Constitutional: No fevers, chills, sweats, or change in appetite Eyes: No visual changes, double vision, eye pain Ear, nose and throat: No hearing loss, ear pain, nasal congestion, sore throat Cardiovascular: No chest pain, palpitations Respiratory:  No shortness of breath at rest or with exertion.   No wheezes GastrointestinaI: No nausea, vomiting, diarrhea, abdominal pain, fecal incontinence Genitourinary:  No dysuria, urinary retention or frequency.  No nocturia. Musculoskeletal:  No neck pain, back pain Integumentary: No rash, pruritus, skin lesions Neurological: as above Psychiatric: No depression at this time.  No anxiety Endocrine: No palpitations, diaphoresis, change in appetite, change in weigh or increased thirst Hematologic/Lymphatic:  No anemia, purpura, petechiae. Allergic/Immunologic: No itchy/runny eyes, nasal congestion, recent allergic reactions, rashes  ALLERGIES: Allergies  Allergen Reactions   Bee Venom Swelling   Nsaids Other (See Comments)  Dyspepsia. Advised by Primary Provider to avoid because of Kidneys    HOME MEDICATIONS:  Current Outpatient Medications:    acetaminophen (TYLENOL) 500 MG tablet, Take 1,000 mg by mouth every 6 (six) hours as needed for mild pain or moderate pain. , Disp: , Rfl:    amLODipine (NORVASC) 10 MG tablet, Take 10 mg by mouth daily., Disp: , Rfl:    aspirin EC 81 MG tablet, Take 81 mg by mouth daily., Disp: , Rfl:    atorvastatin  (LIPITOR) 40 MG tablet, Take 40 mg by mouth at bedtime. , Disp: , Rfl:    Cholecalciferol (VITAMIN D3) 1.25 MG (50000 UT) CAPS, Take 50,000 capsules by mouth once a week., Disp: , Rfl:    cycloSPORINE (RESTASIS) 0.05 % ophthalmic emulsion, Place 1 drop into both eyes 2 (two) times daily., Disp: , Rfl:    ferrous sulfate 325 (65 FE) MG tablet, Take 325 mg by mouth daily with breakfast., Disp: , Rfl:    folic acid (FOLVITE) 1 MG tablet, Take 1 mg by mouth daily., Disp: , Rfl:    glipiZIDE (GLUCOTROL XL) 5 MG 24 hr tablet, Take 5 mg by mouth 3 (three) times a week. Mon, Wed, Fri, Disp: , Rfl:    insulin glargine (LANTUS) 100 UNIT/ML injection, Inject 0.18 mLs (18 Units total) into the skin at bedtime., Disp: 10 mL, Rfl: 11   levothyroxine (SYNTHROID) 175 MCG tablet, Take 175 mcg by mouth daily before breakfast., Disp: , Rfl:    losartan (COZAAR) 100 MG tablet, Take 0.5 tablets (50 mg total) by mouth daily., Disp: 30 tablet, Rfl: 0   LUMIGAN 0.01 % SOLN, Place 1 drop into both eyes at bedtime. Separate by at least 10 minutes from other intraocular pressure reducing ophthalmic drugs, Disp: , Rfl:    meclizine (ANTIVERT) 25 MG tablet, Take 25 mg by mouth 3 (three) times daily as needed for dizziness., Disp: , Rfl:    metoprolol succinate (TOPROL-XL) 50 MG 24 hr tablet, Take 50 mg by mouth at bedtime. Take with or immediately following a meal., Disp: , Rfl:    Oxycodone HCl 10 MG TABS, Take 0.5 tablets (5 mg total) by mouth every 6 (six) hours as needed., Disp: 30 tablet, Rfl: 0   RHOPRESSA 0.02 % SOLN, Place 1 drop into the left eye at bedtime., Disp: , Rfl:    vitamin C (ASCORBIC ACID) 500 MG tablet, Take 500 mg by mouth daily., Disp: , Rfl:   PAST MEDICAL HISTORY: Past Medical History:  Diagnosis Date   Abdominal pain    Coronary artery disease    Diabetes mellitus without complication ([place])    Glaucoma    Hypertension    Hypothyroid    Renal insufficiency 09/01/2017   Sickle cell anemia (HCC)      PAST SURGICAL HISTORY: Past Surgical History:  Procedure Laterality Date   CHOLECYSTECTOMY     HIP SURGERY     6 yrs ago   REFRACTIVE SURGERY     RETINAL DETACHMENT SURGERY      FAMILY HISTORY: Family History  Problem Relation Age of Onset   Diabetes Mother    Cancer Father        Prostate   Diabetes Father    Cancer Brother    Diabetes Brother    Breast cancer Maternal Aunt        does not know age    SOCIAL HISTORY:  Social History   Socioeconomic History   Marital status: Married  Spouse name: Not on file   Number of children: 0   Years of education: 36   Highest education level: Not on file  Occupational History   Not on file  Tobacco Use   Smoking status: Former    Pack years: 0.00   Smokeless tobacco: Never  Vaping Use   Vaping Use: Never used  Substance and Sexual Activity   Alcohol use: No   Drug use: No   Sexual activity: Not on file  Other Topics Concern   Not on file  Social History Narrative   Some Tea, soda is rare   Right handed   Lives w/ husband   Social Determinants of Health   Financial Resource Strain: Not on file  Food Insecurity: Not on file  Transportation Needs: Not on file  Physical Activity: Not on file  Stress: Not on file  Social Connections: Not on file  Intimate Partner Violence: Not on file     PHYSICAL EXAM  Vitals:   05/22/21 1450  BP: 131/69  Pulse: 74  SpO2: 96%  Weight: 144 lb (65.3 kg)  Height: '5\' 3"'$  (1.6 m)    Body mass index is 25.51 kg/m.   General: The patient is well-developed and well-nourished and in no acute distress  HEENT:  Head is [place]/AT.  Sclera are anicteric.  She appears to have impacted wax on the left.  The right ear canal has just a small amount of wax.    Neck: No carotid bruits are noted.  The neck is nontender.  Cardiovascular: The heart has a regular rate and rhythm with a normal S1 and S2. There were no murmurs, gallops or rubs.    Skin: Extremities are without rash  or  edema.  Musculoskeletal:  Back is nontender  Neurologic Exam  Mental status: The patient is alert and oriented x 3 at the time of the examination. The patient has apparent normal recent and remote memory, with an apparently normal attention span and concentration ability.   Speech is normal.  Cranial nerves: Extraocular movements are full. Pupils are equal, round, and reactive to light and accomodation.  Visual fields are full.  Facial symmetry is present. There is good facial sensation to soft touch bilaterally.Facial strength is normal.  Trapezius and sternocleidomastoid strength is normal. No dysarthria is noted.  The tongue is midline, and the patient has symmetric elevation of the soft palate.  She has markedly reduced hearing on the left.  The Weber does not lateralize.  Motor:  Muscle bulk is normal.   Tone is normal. Strength is  5 / 5 in all 4 extremities.   Sensory: Sensory testing is intact to pinprick, soft touch and vibration sensation in all 4 extremities.  Coordination: Cerebellar testing reveals good finger-nose-finger and heel-to-shin bilaterally.  Gait and station: Station is normal.   She needs unilateral support to take a few steps.  Romberg is negative.   Reflexes: Deep tendon reflexes are symmetric and 2 in the arms and 1 at the knees and absent at the ankles..   Plantar responses are flexor.    DIAGNOSTIC DATA (LABS, IMAGING, TESTING) - I reviewed patient records, labs, notes, testing and imaging myself where available.  Lab Results  Component Value Date   WBC 17.3 (H) 05/15/2021   HGB 8.2 (L) 05/15/2021   HCT 23.0 (L) 05/15/2021   MCV 79.3 (L) 05/15/2021   PLT 209 05/15/2021      Component Value Date/Time   NA 138 05/15/2021 0554  K 4.2 05/15/2021 0554   CL 111 05/15/2021 0554   CO2 20 (L) 05/15/2021 0554   GLUCOSE 179 (H) 05/15/2021 0554   BUN 38 (H) 05/15/2021 0554   CREATININE 1.53 (H) 05/15/2021 0554   CALCIUM 9.5 05/15/2021 0554   PROT 6.4  (L) 05/12/2021 0558   ALBUMIN 3.0 (L) 05/12/2021 0558   AST 36 05/12/2021 0558   ALT 64 (H) 05/12/2021 0558   ALKPHOS 87 05/12/2021 0558   BILITOT 1.0 05/12/2021 0558   GFRNONAA 36 (L) 05/15/2021 0554   GFRAA 38 (L) 06/16/2019 0921   No results found for: CHOL, HDL, LDLCALC, LDLDIRECT, TRIG, CHOLHDL Lab Results  Component Value Date   HGBA1C 4.4 (L) 06/12/2019   No results found for: VITAMINB12 Lab Results  Component Value Date   TSH 0.814 02/22/2011       ASSESSMENT AND PLAN  Vertigo  Conductive hearing loss of left ear with unrestricted hearing of right ear  Impacted cerumen of left ear  Sickle cell disease with crisis ([place])  In summary, Ms. Vannice is a 75 year old woman with vertigo over the past 2 months who also has reduced hearing on the left and impacted cerumen on that side.  The Dix-Hallpike maneuver was negative today.  I have asked them to obtain earwax drops.  If there is no improvement in her hearing after 4 to 5 days, then she would need to see ENT to have the wax removed.  Hopefully that would help her vertigo as well.  I also instructed her on the Brandt-Daroff exercises to do if she does not improve.  She will return as needed if there are new or worsening neurologic symptoms.  Thank you for asking me to see Ms. Bradwell.   Amarissa Koerner A. Felecia Shelling, MD, [facility] [id number], [id number] PM Certified in Neurology, Clinical Neurophysiology, Sleep Medicine and Neuroimaging  [facility] [address], [place], [place] [address] [phone]

## 2021-07-02 ENCOUNTER — Encounter (INDEPENDENT_AMBULATORY_CARE_PROVIDER_SITE_OTHER): Payer: 59 | Admitting: Ophthalmology

## 2021-07-23 ENCOUNTER — Emergency Department (HOSPITAL_COMMUNITY): Payer: 59

## 2021-07-23 ENCOUNTER — Emergency Department (HOSPITAL_COMMUNITY)
Admission: EM | Admit: 2021-07-23 | Discharge: 2021-07-23 | Disposition: A | Discharge status: Home / Self Care | Payer: 59 | Attending: Emergency Medicine | Admitting: Emergency Medicine

## 2021-07-23 ENCOUNTER — Other Ambulatory Visit: Payer: Self-pay

## 2021-07-23 DIAGNOSIS — W19XXXA Unspecified fall, initial encounter: Secondary | ICD-10-CM

## 2021-07-23 DIAGNOSIS — S92504A Nondisplaced unspecified fracture of right lesser toe(s), initial encounter for closed fracture: Secondary | ICD-10-CM

## 2021-07-23 DIAGNOSIS — Z7984 Long term (current) use of oral hypoglycemic drugs: Secondary | ICD-10-CM | POA: Diagnosis not present

## 2021-07-23 DIAGNOSIS — Z79899 Other long term (current) drug therapy: Secondary | ICD-10-CM | POA: Insufficient documentation

## 2021-07-23 DIAGNOSIS — E1122 Type 2 diabetes mellitus with diabetic chronic kidney disease: Secondary | ICD-10-CM | POA: Insufficient documentation

## 2021-07-23 DIAGNOSIS — Z87891 Personal history of nicotine dependence: Secondary | ICD-10-CM | POA: Insufficient documentation

## 2021-07-23 DIAGNOSIS — E039 Hypothyroidism, unspecified: Secondary | ICD-10-CM | POA: Diagnosis not present

## 2021-07-23 DIAGNOSIS — W01198A Fall on same level from slipping, tripping and stumbling with subsequent striking against other object, initial encounter: Secondary | ICD-10-CM | POA: Diagnosis not present

## 2021-07-23 DIAGNOSIS — S99921A Unspecified injury of right foot, initial encounter: Secondary | ICD-10-CM | POA: Diagnosis present

## 2021-07-23 DIAGNOSIS — S0990XA Unspecified injury of head, initial encounter: Secondary | ICD-10-CM | POA: Insufficient documentation

## 2021-07-23 DIAGNOSIS — Z7982 Long term (current) use of aspirin: Secondary | ICD-10-CM | POA: Insufficient documentation

## 2021-07-23 DIAGNOSIS — Z794 Long term (current) use of insulin: Secondary | ICD-10-CM | POA: Diagnosis not present

## 2021-07-23 DIAGNOSIS — I129 Hypertensive chronic kidney disease with stage 1 through stage 4 chronic kidney disease, or unspecified chronic kidney disease: Secondary | ICD-10-CM | POA: Diagnosis not present

## 2021-07-23 DIAGNOSIS — I2510 Atherosclerotic heart disease of native coronary artery without angina pectoris: Secondary | ICD-10-CM | POA: Insufficient documentation

## 2021-07-23 DIAGNOSIS — N184 Chronic kidney disease, stage 4 (severe): Secondary | ICD-10-CM | POA: Diagnosis not present

## 2021-07-23 NOTE — ED Notes (Signed)
Placed buddy tape on right fourth and third toes.

## 2021-07-23 NOTE — ED Provider Notes (Addendum)
[facility] EMERGENCY DEPARTMENT Provider Note   CSN: [id number] Arrival date & time: 07/23/21  [id number]     History Chief Complaint  Patient presents with  . Toe Injury  . Fall    [name] is a 75 y.o. female with past medical history who presents for evaluation of fall.  Patient states in the [place] she got up to use the restroom.  States her legs got tangled in the sheets and she subsequently fell stubbing her right toe.  She thinks she hit the right side of her head.  She denies any LOC or anticoagulation.  Patient states she has pain at her fourth digit right lower extremity.  She has been able to walk.  She states walks with a cane at baseline.  She denies any preceding lightheadedness, dizziness, syncope, chest pain, shortness of breath or abdominal pain.  She has no additional sites of pain.  Rates her current pain a 3/10.  Denies additional aggravating or alleviating factors.  History obtained from patient and past medical records.  No interpreter is used  HPI     Past Medical History:  Diagnosis Date  . Abdominal pain   . Coronary artery disease   . Diabetes mellitus without complication ([place])   . Glaucoma   . Hypertension   . Hypothyroid   . Renal insufficiency 09/01/2017  . Sickle cell anemia [facility])     Patient Active Problem List   Diagnosis Date Noted  . Vertigo 05/22/2021  . Conductive hearing loss of left ear with unrestricted hearing of right ear 05/22/2021  . Impacted cerumen of left ear 05/22/2021  . Fever of unknown origin 05/15/2021  . Leukocytosis 05/10/2021  . Sickle cell disease with crisis ([place]) 05/10/2021  . Hypothyroid   . 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 Right epiretinal membrane 09/27/2020  . Controlled type 2 diabetes mellitus with stable proliferative retinopathy of both eyes, with long-term current use of insulin ([place]) 09/27/2020  . Primary open angle glaucoma of both eyes, moderate stage 09/27/2020   . Sickle cell pain crisis ([place]) 06/11/2019  . Acute renal failure superimposed on stage 4 chronic kidney disease ([place])   . Chronic pain syndrome   . 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 DM2 (diabetes mellitus, type 2) ([place]) 09/01/2017  . HTN (hypertension) 09/01/2017  . CKD (chronic kidney disease), stage III ([place]) 09/01/2017  . Acute lower UTI 09/01/2017    Past Surgical History:  Procedure Laterality Date  . CHOLECYSTECTOMY    . HIP SURGERY     6 yrs ago  . REFRACTIVE SURGERY    . RETINAL DETACHMENT SURGERY       OB History   No obstetric history on file.     Family History  Problem Relation Age of Onset  . Diabetes Mother   . Cancer Father        Prostate  . Diabetes Father   . Cancer Brother   . Diabetes Brother   . Breast cancer Maternal Aunt        does not know age    Social History   Tobacco Use  . Smoking status: Former  . Smokeless tobacco: Never  Vaping Use  . Vaping Use: Never used  Substance Use Topics  . Alcohol use: No  . Drug use: No    Home Medications Prior to Admission medications   Medication Sig Start Date End Date Taking? Authorizing Provider  acetaminophen (  TYLENOL) 500 MG tablet Take 1,000 mg by mouth every 6 (six) hours as needed for mild pain or moderate pain.     [provider]  amLODipine (NORVASC) 10 MG tablet Take 10 mg by mouth daily.    [provider]  aspirin EC 81 MG tablet Take 81 mg by mouth daily.    [provider]  atorvastatin (LIPITOR) 40 MG tablet Take 40 mg by mouth at bedtime.     [provider]  Cholecalciferol (VITAMIN D3) 1.25 MG (50000 UT) CAPS Take 50,000 capsules by mouth once a week. 04/19/21   [provider]  cycloSPORINE (RESTASIS) 0.05 % ophthalmic emulsion Place 1 drop into both eyes 2 (two) times daily.    [provider]  ferrous sulfate 325 (65 FE) MG tablet Take 325 mg by mouth daily with breakfast.    [provider]  folic acid (FOLVITE) 1 MG tablet Take 1 mg by mouth daily.    [provider]  glipiZIDE (GLUCOTROL XL) 5 MG 24 hr tablet Take 5 mg by mouth 3 (three) times a week. Mon, Wed, Fri    [provider]  insulin glargine (LANTUS) 100 UNIT/ML injection Inject 0.18 mLs (18 Units total) into the skin at bedtime. 09/02/17   Leana Gamer, MD  levothyroxine (SYNTHROID) 175 MCG tablet Take 175 mcg by mouth daily before breakfast.    [provider]  losartan (COZAAR) 100 MG tablet Take 0.5 tablets (50 mg total) by mouth daily. 05/15/21   Dorena Dew, FNP  LUMIGAN 0.01 % SOLN Place 1 drop into both eyes at bedtime. Separate by at least 10 minutes from other intraocular pressure reducing ophthalmic drugs 06/06/19   [provider]  meclizine (ANTIVERT) 25 MG tablet Take 25 mg by mouth 3 (three) times daily as needed for dizziness. 04/25/21   [provider]  metoprolol succinate (TOPROL-XL) 50 MG 24 hr tablet Take 50 mg by mouth at bedtime. Take with or immediately following a meal.    [provider]  Oxycodone HCl 10 MG TABS Take 0.5 tablets (5 mg total) by mouth every 6 (six) hours as needed. 05/15/21   Dorena Dew, FNP  RHOPRESSA 0.02 % SOLN Place 1 drop into the left eye at bedtime. 02/16/21   [provider]  vitamin C (ASCORBIC ACID) 500 MG tablet Take 500 mg by mouth daily.    [provider]    Allergies    Bee venom and Nsaids  Review of Systems   Review of Systems  Constitutional: Negative.   HENT: Negative.    Respiratory: Negative.    Cardiovascular: Negative.   Genitourinary: Negative.   Musculoskeletal:        Right foot pain  Skin: Negative.   Neurological: Negative.   All other systems reviewed and are negative.  Physical Exam Updated Vital Signs BP (!) 152/57 (BP Location: Right Arm)   Pulse (!) 58   Temp 97.7 F (36.5 C) (Oral)   Resp 18   Ht '5\' 3"'$  (1.6 m)   Wt 65.3 kg    SpO2 100%   BMI 25.51 kg/m   Physical Exam Vitals and nursing note reviewed.  Constitutional:      General: She is not in acute distress.    Appearance: She is well-developed. She is not ill-appearing, toxic-appearing or diaphoretic.  HENT:     Head: Normocephalic and atraumatic.     Jaw: There is normal jaw occlusion.  Comments: No battle sign, raccoon eyes.  No hematoma.  Nontender.    Ears:     Comments: No hemotympanum     Nose: Nose normal.     Mouth/Throat:     Mouth: Mucous membranes are moist.     Comments: Dentition intact Eyes:     Pupils: Pupils are equal, round, and reactive to light.  Cardiovascular:     Rate and Rhythm: Normal rate.     Pulses: Normal pulses.          Radial pulses are 2+ on the right side and 2+ on the left side.       Dorsalis pedis pulses are 2+ on the right side and 2+ on the left side.     Heart sounds: Normal heart sounds.  Pulmonary:     Effort: Pulmonary effort is normal. No respiratory distress.     Breath sounds: Normal breath sounds.     Comments: Clear bilaterally.  Speaks in full sentences without difficulty Chest:     Comments: No bony tenderness to chest wall Abdominal:     General: Bowel sounds are normal. There is no distension.     Tenderness: There is no abdominal tenderness.     Comments: Soft, nontender without rebound or guarding  Musculoskeletal:        General: Normal range of motion.     Cervical back: Normal range of motion.     Comments: No midline spinal tenderness, crepitus or step-off Nontender upper extremities Left lower extremity nontender Pelvis stable, nontender palpation No bony tenderness to right hip, femur, tib-fib, ankle.  Mild tenderness to fourth digit right lower extremity no obvious deformity Wiggles toes without difficulty  Skin:    General: Skin is warm and dry.  Neurological:     General: No focal deficit present.     Mental Status: She is alert.     Cranial Nerves: Cranial nerves are  intact.     Sensory: Sensation is intact.     Motor: Motor function is intact.     Gait: Gait is intact.     Comments: CN 2-12 grossly intact Equal strength Intact sensation Ambulatory with cane without ataxic gait  Psychiatric:        Mood and Affect: Mood normal.    ED Results / Procedures / Treatments   Labs (all labs ordered are listed, but only abnormal results are displayed) Labs Reviewed - No data to display  EKG None  Radiology CT HEAD WO CONTRAST (5MM)  Result Date: 07/23/2021 CLINICAL DATA:  Head trauma, mod-severe EXAM: CT HEAD WITHOUT CONTRAST TECHNIQUE: Contiguous axial images were obtained from the base of the skull through the vertex without intravenous contrast. COMPARISON:  May 03, 2021. FINDINGS: Brain: No evidence of acute infarction, hemorrhage, hydrocephalus, extra-axial collection or mass lesion/mass effect. Partially empty sella. Vascular: No hyperdense vessel identified. Skull: No acute fracture. Sinuses/Orbits: Visualized sinuses are clear. No acute orbital findings. Other: Mastoid effusions. IMPRESSION: No evidence acute intracranial abnormality. Electronically Signed   By: Margaretha Sheffield M.D.   On: 07/23/2021 14:58   DG Foot Complete Right  Result Date: 07/23/2021 CLINICAL DATA:  Right fourth toe pain after fall today EXAM: RIGHT FOOT COMPLETE - 3+ VIEW COMPARISON:  None. FINDINGS: Possible nondisplaced fracture involving the proximal base of the fourth proximal phalanx. Mild posterior calcaneal spurring is noted. Joint spaces are unremarkable. IMPRESSION: Possible nondisplaced fourth proximal phalangeal fracture. Electronically Signed   By: Bobbe Medico.D.  On: 07/23/2021 07:58    Procedures .Splint Application  Date/Time: 07/23/2021 3:33 PM Performed by: Nettie Elm, PA-C Authorized by: Nettie Elm, PA-C   Consent:    Consent obtained:  Verbal   Consent given by:  Patient   Risks, benefits, and alternatives were discussed: yes      Risks discussed:  Discoloration, numbness, pain and swelling   Alternatives discussed:  No treatment, delayed treatment, alternative treatment, observation and referral Universal protocol:    Procedure explained and questions answered to patient or proxy's satisfaction: yes     Relevant documents present and verified: yes     Test results available: yes     Imaging studies available: yes     Required blood products, implants, devices, and special equipment available: yes     Site/side marked: yes     Immediately prior to procedure a time out was called: yes     Patient identity confirmed:  Verbally with patient Pre-procedure details:    Distal neurologic exam:  Normal   Distal perfusion: distal pulses strong and brisk capillary refill   Procedure details:    Location:  Toe   Toe location:  R fourth toe   Strapping: no     Lower extremity splint type: Buddy tape toes.   Attestation: Splint applied and adjusted personally by me   Post-procedure details:    Distal neurologic exam:  Normal   Distal perfusion: distal pulses strong and brisk capillary refill     Procedure completion:  Tolerated well, no immediate complications   Post-procedure imaging: not applicable     Medications Ordered in ED Medications - No data to display  ED Course  I have reviewed the triage vital signs and the nursing notes.  Pertinent labs & imaging results that were available during my care of the patient were reviewed by me and considered in my medical decision making (see chart for details).  Here for evaluation mechanical fall which occurred in the middle of the night.  Unfortunately had extended stay while in the waiting room.  She denies LOC or anticoagulation.  She is afebrile, nonseptic, not ill-appearing.  Admits to hitting the right side of her head however no associated headache.  Has a nonfocal neuro exam without deficits.  No presyncope or syncopal symptoms.  She has been ambulatory with her  cane which she uses at walker.  No chest pain, shortness of breath, paresthesias, weakness, headache, vision changes.  She has no midline spinal tenderness.  No shortening or rotation of legs.  Her only complaint is fourth digit pain on her right lower extremity.  Does not appear obviously dislocated.  No overlying skin changes  CT head without any significant abnormality X-ray with nondisplaced fourth proximal phalangeal fracture.  No dislocation.  Discussed work-up with patient.  Buddy tape toes.  Encourage follow-up with PCP or orthopedics  She will return for any worsening symptoms  The patient has been appropriately medically screened and/or stabilized in the ED. I have low suspicion for any other emergent medical condition which would require further screening, evaluation or treatment in the ED or require inpatient management.  Patient is hemodynamically stable and in no acute distress.  Patient able to ambulate in department prior to ED.  Evaluation does not show acute pathology that would require ongoing or additional emergent interventions while in the emergency department or further inpatient treatment.  I have discussed the diagnosis with the patient and answered all questions.  Pain is been  managed while in the emergency department and patient has no further complaints prior to discharge.  Patient is comfortable with plan discussed in room and is stable for discharge at this time.  I have discussed strict return precautions for returning to the emergency department.  Patient was encouraged to follow-up with PCP/specialist refer to at discharge.     MDM Rules/Calculators/A&P                            Final Clinical Impression(s) / ED Diagnoses Final diagnoses:  Closed nondisplaced fracture of phalanx of lesser toe of right foot, unspecified phalanx, initial encounter  Fall, initial encounter    Rx / DC Orders ED Discharge Orders     None        Jalen Daluz A,  PA-C 07/23/21 1534    Julissa Browning A, PA-C 07/23/21 1535    Hayden Rasmussen, MD 07/23/21 1927

## 2021-07-23 NOTE — Discharge Instructions (Addendum)
Tylenol and ice your foot  Follow up with Orthopedics or Primary care provider  Return for new or worsening symptoms

## 2021-07-23 NOTE — ED Triage Notes (Signed)
Pt here POV with c/o toe pain d/t fall. Pt got up this am to use restoom, half asleep and fell. No LOC reported. Pt reports hitting right side of head. Denies head pain. No blood thinners. Pt reports right 4th toe pain 6/10

## 2021-08-08 ENCOUNTER — Encounter (INDEPENDENT_AMBULATORY_CARE_PROVIDER_SITE_OTHER): Payer: 59 | Admitting: Ophthalmology

## 2021-08-14 ENCOUNTER — Other Ambulatory Visit: Payer: Self-pay

## 2021-08-14 ENCOUNTER — Encounter (INDEPENDENT_AMBULATORY_CARE_PROVIDER_SITE_OTHER): Payer: Self-pay | Admitting: Ophthalmology

## 2021-08-14 ENCOUNTER — Ambulatory Visit (INDEPENDENT_AMBULATORY_CARE_PROVIDER_SITE_OTHER): Payer: 59 | Admitting: Ophthalmology

## 2021-08-14 DIAGNOSIS — H3523 Other non-diabetic proliferative retinopathy, bilateral: Secondary | ICD-10-CM | POA: Diagnosis not present

## 2021-08-14 DIAGNOSIS — H35371 Puckering of macula, right eye: Secondary | ICD-10-CM

## 2021-08-14 DIAGNOSIS — H43821 Vitreomacular adhesion, right eye: Secondary | ICD-10-CM | POA: Diagnosis not present

## 2021-08-14 NOTE — Assessment & Plan Note (Signed)
Minor thickening no impact on acuity will observe

## 2021-08-14 NOTE — Progress Notes (Signed)
08/14/2021     CHIEF COMPLAINT Patient presents for  Chief Complaint  Patient presents with   Retina Follow Up      HISTORY OF PRESENT ILLNESS: [name] is a 75 y.o. female who presents to the clinic today for:   HPI     Retina Follow Up   Patient presents with  Diabetic Retinopathy.  In both eyes.  This started 10 months ago.  Duration of 10 months.        Comments   10 month f/u OU with OCT  Pt denies any visual changes since previous visit. Pt denies any new flashes or floaters. Pt c/o mild aching pain mostly in the left eye, lasting only seconds with each episode, occurring ~ every 4 months or so.   Type 2 diabetic. Diagnosed for ~ 15-20 years. A1c: "I think it was a 6."  Eye Meds: Lumigan QHS OU Rhopressa QHS OS      Last edited by Reather Littler, COA on 08/14/2021 10:40 AM.      Referring physician: Vincente Liberty, MD [address] [place],  [place] [address]  HISTORICAL INFORMATION:   Selected notes from the MEDICAL RECORD NUMBER    Lab Results  Component Value Date   HGBA1C 4.4 (L) 06/12/2019     CURRENT MEDICATIONS: Current Outpatient Medications (Ophthalmic Drugs)  Medication Sig   cycloSPORINE (RESTASIS) 0.05 % ophthalmic emulsion Place 1 drop into both eyes 2 (two) times daily.   LUMIGAN 0.01 % SOLN Place 1 drop into both eyes at bedtime. Separate by at least 10 minutes from other intraocular pressure reducing ophthalmic drugs   RHOPRESSA 0.02 % SOLN Place 1 drop into the left eye at bedtime.   No current facility-administered medications for this visit. (Ophthalmic Drugs)   Current Outpatient Medications (Other)  Medication Sig   acetaminophen (TYLENOL) 500 MG tablet Take 1,000 mg by mouth every 6 (six) hours as needed for mild pain or moderate pain.    amLODipine (NORVASC) 10 MG tablet Take 10 mg by mouth daily.   aspirin EC 81 MG tablet Take 81 mg by mouth daily.   atorvastatin (LIPITOR) 40 MG tablet Take 40 mg by  mouth at bedtime.    Cholecalciferol (VITAMIN D3) 1.25 MG (50000 UT) CAPS Take 50,000 capsules by mouth once a week.   ferrous sulfate 325 (65 FE) MG tablet Take 325 mg by mouth daily with breakfast.   folic acid (FOLVITE) 1 MG tablet Take 1 mg by mouth daily.   glipiZIDE (GLUCOTROL XL) 5 MG 24 hr tablet Take 5 mg by mouth 3 (three) times a week. Mon, Wed, Fri   insulin glargine (LANTUS) 100 UNIT/ML injection Inject 0.18 mLs (18 Units total) into the skin at bedtime.   levothyroxine (SYNTHROID) 175 MCG tablet Take 175 mcg by mouth daily before breakfast.   losartan (COZAAR) 100 MG tablet Take 0.5 tablets (50 mg total) by mouth daily.   meclizine (ANTIVERT) 25 MG tablet Take 25 mg by mouth 3 (three) times daily as needed for dizziness.   metoprolol succinate (TOPROL-XL) 50 MG 24 hr tablet Take 50 mg by mouth at bedtime. Take with or immediately following a meal.   Oxycodone HCl 10 MG TABS Take 0.5 tablets (5 mg total) by mouth every 6 (six) hours as needed.   vitamin C (ASCORBIC ACID) 500 MG tablet Take 500 mg by mouth daily.   No current facility-administered medications for this visit. (Other)  REVIEW OF SYSTEMS:    ALLERGIES Allergies  Allergen Reactions   Bee Venom Swelling   Nsaids Other (See Comments)    Dyspepsia. Advised by Primary Provider to avoid because of Kidneys    PAST MEDICAL HISTORY Past Medical History:  Diagnosis Date   Abdominal pain    Coronary artery disease    Diabetes mellitus without complication ([place])    Glaucoma    Hypertension    Hypothyroid    Renal insufficiency 09/01/2017   Sickle cell anemia ([facility])    Past Surgical History:  Procedure Laterality Date   CHOLECYSTECTOMY     HIP SURGERY     6 yrs ago   REFRACTIVE SURGERY     RETINAL DETACHMENT SURGERY      FAMILY HISTORY Family History  Problem Relation Age of Onset   Diabetes Mother    Cancer Father        Prostate   Diabetes Father    Cancer Brother    Diabetes Brother     Breast cancer Maternal Aunt        does not know age    SOCIAL HISTORY Social History   Tobacco Use   Smoking status: Former   Smokeless tobacco: Never  Scientific laboratory technician Use: Never used  Substance Use Topics   Alcohol use: No   Drug use: No         OPHTHALMIC EXAM:  Base Eye Exam     Visual Acuity (ETDRS)       Right Left   Dist [place] 20/30 -2 20/80 -1   Dist ph [place] NI 20/30 -2         Tonometry (Tonopen, 10:48 AM)       Right Left   Pressure 12 14         Pupils       Pupils Dark Light Shape React APD   Right PERRL 4 2 Round Brisk None   Left PERRL 4 2 Round Brisk None         Visual Fields (Counting fingers)       Left Right    Full Full         Extraocular Movement       Right Left    Full, Ortho Full, Ortho         Neuro/Psych     Oriented x3: Yes   Mood/Affect: Normal         Dilation     Both eyes: 1.0% Mydriacyl, 2.5% Phenylephrine @ 10:48 AM           Slit Lamp and Fundus Exam     External Exam       Right Left   External Normal Normal         Slit Lamp Exam       Right Left   Lids/Lashes Normal Normal   Conjunctiva/Sclera White and quiet White and quiet   Cornea Clear Clear   Anterior Chamber Deep and quiet Deep and quiet   Iris Round and reactive Round and reactive   Lens Centered posterior chamber intraocular lens Centered posterior chamber intraocular lens   Anterior Vitreous Normal Normal         Fundus Exam       Right Left   Posterior Vitreous Normal Vitrectomized   Disc Normal Normal   C/D Ratio 0.35 0.45   Macula Microaneurysms, no macular thickening, minimal clinical epiretinal membrane noted. Microaneurysms, no macular thickening   Vessels  PDR-quiet PDR-quiet   Periphery good PRP good PRP            IMAGING AND PROCEDURES  Imaging and Procedures for 08/14/21  OCT, Retina - OU - Both Eyes       Right Eye Quality was good. Scan locations included subfoveal. Central Foveal  Thickness: 380. Findings include epiretinal membrane, abnormal foveal contour.   Left Eye Scan locations included subfoveal. Central Foveal Thickness: 291. Progression has been stable. Findings include normal observations.   Notes Minor epiretinal membrane OD, no significant impact on acuity no significant TOPA distortion mild thickening No outer retinal changes.             ASSESSMENT/PLAN:  Nondiabetic proliferative retinopathy of both eyes Stable OU no active disease  Right epiretinal membrane Minor thickening no impact on acuity will observe  Vitreomacular adhesion of right eye Minor physiologic     ICD-10-CM   1. Right epiretinal membrane  H35.371 OCT, Retina - OU - Both Eyes    2. Nondiabetic proliferative retinopathy of both eyes  H35.23     3. Vitreomacular adhesion of right eye  H43.821       1.  OU status post peripheral anterior PRP and large regions of capillary nonperfusion secondary to hemoglobin SS disease and nondiabetic proliferative retinopathy.  2.  OD with minor epiretinal membrane observe  3.  Ophthalmic Meds Ordered this visit:  No orders of the defined types were placed in this encounter.      Return in about 9 months (around 05/14/2022) for COLOR FP, DILATE OU, OCT.  There are no Patient Instructions on file for this visit.   Explained the diagnoses, plan, and follow up with the patient and they expressed understanding.  Patient expressed understanding of the importance of proper follow up care.   Clent Demark Mitzy Naron M.D. Diseases & Surgery of the Retina and Vitreous Retina & Diabetic [place] 08/14/21     Abbreviations: M myopia (nearsighted); A astigmatism; H hyperopia (farsighted); P presbyopia; Mrx spectacle prescription;  CTL contact lenses; OD right eye; OS left eye; OU both eyes  XT exotropia; ET esotropia; PEK punctate epithelial keratitis; PEE punctate epithelial erosions; DES dry eye syndrome; MGD meibomian gland  dysfunction; ATs artificial tears; PFAT's preservative free artificial tears; [place] nuclear sclerotic cataract; PSC posterior subcapsular cataract; ERM epi-retinal membrane; PVD posterior vitreous detachment; RD retinal detachment; DM diabetes mellitus; DR diabetic retinopathy; NPDR non-proliferative diabetic retinopathy; PDR proliferative diabetic retinopathy; CSME clinically significant macular edema; DME diabetic macular edema; dbh dot blot hemorrhages; CWS cotton wool spot; POAG primary open angle glaucoma; C/D cup-to-disc ratio; HVF humphrey visual field; GVF goldmann visual field; OCT optical coherence tomography; IOP intraocular pressure; BRVO Branch retinal vein occlusion; CRVO central retinal vein occlusion; CRAO central retinal artery occlusion; BRAO branch retinal artery occlusion; RT retinal tear; SB scleral buckle; PPV pars plana vitrectomy; VH Vitreous hemorrhage; PRP panretinal laser photocoagulation; IVK intravitreal kenalog; VMT vitreomacular traction; MH Macular hole;  NVD neovascularization of the disc; NVE neovascularization elsewhere; AREDS age related eye disease study; ARMD age related macular degeneration; POAG primary open angle glaucoma; EBMD epithelial/anterior basement membrane dystrophy; ACIOL anterior chamber intraocular lens; IOL intraocular lens; PCIOL posterior chamber intraocular lens; Phaco/IOL phacoemulsification with intraocular lens placement; [place] photorefractive keratectomy; LASIK laser assisted in situ keratomileusis; HTN hypertension; DM diabetes mellitus; COPD chronic obstructive pulmonary disease

## 2021-08-14 NOTE — Assessment & Plan Note (Signed)
Minor physiologic

## 2021-08-14 NOTE — Assessment & Plan Note (Signed)
Stable OU no active disease

## 2021-10-28 ENCOUNTER — Other Ambulatory Visit: Payer: Self-pay | Admitting: Pulmonary Disease

## 2021-10-28 DIAGNOSIS — Z1231 Encounter for screening mammogram for malignant neoplasm of breast: Secondary | ICD-10-CM

## 2021-11-29 ENCOUNTER — Ambulatory Visit
Admission: RE | Admit: 2021-11-29 | Discharge: 2021-11-29 | Disposition: A | Discharge status: Home / Self Care | Payer: 59 | Source: Ambulatory Visit | Attending: Pulmonary Disease | Admitting: Pulmonary Disease

## 2021-11-29 DIAGNOSIS — Z1231 Encounter for screening mammogram for malignant neoplasm of breast: Secondary | ICD-10-CM

## 2021-12-11 ENCOUNTER — Emergency Department (HOSPITAL_COMMUNITY)
Admission: EM | Admit: 2021-12-11 | Discharge: 2021-12-11 | Disposition: A | Discharge status: Home / Self Care | Payer: 59 | Attending: Emergency Medicine | Admitting: Emergency Medicine

## 2021-12-11 ENCOUNTER — Encounter (HOSPITAL_COMMUNITY): Payer: Self-pay

## 2021-12-11 ENCOUNTER — Other Ambulatory Visit: Payer: Self-pay

## 2021-12-11 ENCOUNTER — Emergency Department (HOSPITAL_COMMUNITY): Payer: 59

## 2021-12-11 DIAGNOSIS — Z7984 Long term (current) use of oral hypoglycemic drugs: Secondary | ICD-10-CM | POA: Insufficient documentation

## 2021-12-11 DIAGNOSIS — Z7982 Long term (current) use of aspirin: Secondary | ICD-10-CM | POA: Insufficient documentation

## 2021-12-11 DIAGNOSIS — M533 Sacrococcygeal disorders, not elsewhere classified: Secondary | ICD-10-CM | POA: Diagnosis present

## 2021-12-11 DIAGNOSIS — E119 Type 2 diabetes mellitus without complications: Secondary | ICD-10-CM | POA: Diagnosis not present

## 2021-12-11 DIAGNOSIS — I10 Essential (primary) hypertension: Secondary | ICD-10-CM | POA: Diagnosis not present

## 2021-12-11 DIAGNOSIS — Z79899 Other long term (current) drug therapy: Secondary | ICD-10-CM | POA: Diagnosis not present

## 2021-12-11 DIAGNOSIS — M79652 Pain in left thigh: Secondary | ICD-10-CM | POA: Diagnosis present

## 2021-12-11 DIAGNOSIS — M87852 Other osteonecrosis, left femur: Secondary | ICD-10-CM | POA: Diagnosis not present

## 2021-12-11 DIAGNOSIS — M87851 Other osteonecrosis, right femur: Secondary | ICD-10-CM | POA: Insufficient documentation

## 2021-12-11 DIAGNOSIS — D5700 Hb-SS disease with crisis, unspecified: Secondary | ICD-10-CM

## 2021-12-11 DIAGNOSIS — D5709 Hb-ss disease with crisis with other specified complication: Secondary | ICD-10-CM | POA: Diagnosis not present

## 2021-12-11 DIAGNOSIS — Z794 Long term (current) use of insulin: Secondary | ICD-10-CM | POA: Insufficient documentation

## 2021-12-11 DIAGNOSIS — D57219 Sickle-cell/Hb-C disease with crisis, unspecified: Secondary | ICD-10-CM | POA: Insufficient documentation

## 2021-12-11 LAB — CBC WITH DIFFERENTIAL/PLATELET
Abs Immature Granulocytes: 0.05 10*3/uL (ref 0.00–0.07)
Basophils Absolute: 0 10*3/uL (ref 0.0–0.1)
Basophils Relative: 0 %
Eosinophils Absolute: 0.2 10*3/uL (ref 0.0–0.5)
Eosinophils Relative: 1 %
HCT: 32.9 % — ABNORMAL LOW (ref 36.0–46.0)
Hemoglobin: 11.5 g/dL — ABNORMAL LOW (ref 12.0–15.0)
Immature Granulocytes: 0 %
Lymphocytes Relative: 14 %
Lymphs Abs: 1.7 10*3/uL (ref 0.7–4.0)
MCH: 29.3 pg (ref 26.0–34.0)
MCHC: 35 g/dL (ref 30.0–36.0)
MCV: 83.7 fL (ref 80.0–100.0)
Monocytes Absolute: 1.2 10*3/uL — ABNORMAL HIGH (ref 0.1–1.0)
Monocytes Relative: 10 %
Neutro Abs: 9.1 10*3/uL — ABNORMAL HIGH (ref 1.7–7.7)
Neutrophils Relative %: 75 %
Platelets: 239 10*3/uL (ref 150–400)
RBC: 3.93 MIL/uL (ref 3.87–5.11)
RDW: 15.9 % — ABNORMAL HIGH (ref 11.5–15.5)
WBC: 12.3 10*3/uL — ABNORMAL HIGH (ref 4.0–10.5)
nRBC: 0.6 % — ABNORMAL HIGH (ref 0.0–0.2)

## 2021-12-11 LAB — RETICULOCYTES
Immature Retic Fract: 26.5 % — ABNORMAL HIGH (ref 2.3–15.9)
RBC.: 3.93 MIL/uL (ref 3.87–5.11)
Retic Count, Absolute: 94.7 10*3/uL (ref 19.0–186.0)
Retic Ct Pct: 2.4 % (ref 0.4–3.1)

## 2021-12-11 LAB — COMPREHENSIVE METABOLIC PANEL
ALT: 16 U/L (ref 0–44)
AST: 17 U/L (ref 15–41)
Albumin: 4.1 g/dL (ref 3.5–5.0)
Alkaline Phosphatase: 96 U/L (ref 38–126)
Anion gap: 4 — ABNORMAL LOW (ref 5–15)
BUN: 35 mg/dL — ABNORMAL HIGH (ref 8–23)
CO2: 24 mmol/L (ref 22–32)
Calcium: 9.3 mg/dL (ref 8.9–10.3)
Chloride: 110 mmol/L (ref 98–111)
Creatinine, Ser: 1.64 mg/dL — ABNORMAL HIGH (ref 0.44–1.00)
GFR, Estimated: 33 mL/min — ABNORMAL LOW (ref >60)
Glucose, Bld: 163 mg/dL — ABNORMAL HIGH (ref 70–99)
Potassium: 4 mmol/L (ref 3.5–5.1)
Sodium: 138 mmol/L (ref 135–145)
Total Bilirubin: 1.1 mg/dL (ref 0.3–1.2)
Total Protein: 7.8 g/dL (ref 6.5–8.1)

## 2021-12-11 MED ORDER — HYDROMORPHONE HCL 2 MG/ML IJ SOLN
2.0000 mg | INTRAMUSCULAR | Status: AC
  Administered 2021-12-11: 2 mg via INTRAVENOUS
  Filled 2021-12-11: qty 1

## 2021-12-11 MED ORDER — SODIUM CHLORIDE 0.45 % IV SOLN: INTRAVENOUS | Status: DC

## 2021-12-11 MED ORDER — ONDANSETRON HCL 4 MG/2ML IJ SOLN
4.0000 mg | INTRAMUSCULAR | Status: DC | PRN
  Administered 2021-12-11: 4 mg via INTRAVENOUS
  Filled 2021-12-11: qty 2

## 2021-12-11 MED ORDER — HYDROMORPHONE HCL 2 MG/ML IJ SOLN
2.0000 mg | INTRAMUSCULAR | Status: DC
  Filled 2021-12-11: qty 1

## 2021-12-11 NOTE — Discharge Instructions (Signed)
Return for any problem.   Follow-up with your regular doctor as instructed.  Return for any problem  -especially for increased pain, fever, nausea, and/or vomiting.

## 2021-12-11 NOTE — ED Provider Notes (Signed)
[facility] DEPT Provider Note   CSN: [id number] Arrival date & time: 12/11/21  0535     History  Chief Complaint  Patient presents with   Sickle Cell Pain Crisis    Glenis Musolf is a 76 y.o. female.  The history is provided by the patient.  Sickle Cell Pain Crisis She has history of hypertension, diabetes, sickle cell disease and comes in with sickle cell exacerbation.  She started having pain in her left thigh 3 days ago.  That resolved and moved to her right shoulder.  Yesterday, she started having pain in her tailbone will continue to have pain in her shoulder.  The pain in her tailbone is typical of her sickle cell disease excepted is a location that has not been impacted in the past.  She denies any trauma.  She has been taking oxycodone without relief.  Pain is rated at 10/10.  She denies any fever or chills or rhinorrhea or cough.   Home Medications Prior to Admission medications   Medication Sig Start Date End Date Taking? Authorizing Provider  acetaminophen (TYLENOL) 500 MG tablet Take 1,000 mg by mouth every 6 (six) hours as needed for mild pain or moderate pain.     [provider]  amLODipine (NORVASC) 10 MG tablet Take 10 mg by mouth daily.    [provider]  aspirin EC 81 MG tablet Take 81 mg by mouth daily.    [provider]  atorvastatin (LIPITOR) 40 MG tablet Take 40 mg by mouth at bedtime.     [provider]  Cholecalciferol (VITAMIN D3) 1.25 MG (50000 UT) CAPS Take 50,000 capsules by mouth once a week. 04/19/21   [provider]  cycloSPORINE (RESTASIS) 0.05 % ophthalmic emulsion Place 1 drop into both eyes 2 (two) times daily.    [provider]  ferrous sulfate 325 (65 FE) MG tablet Take 325 mg by mouth daily with breakfast.    [provider]  folic acid (FOLVITE) 1 MG tablet Take 1 mg by mouth daily.    [provider]  glipiZIDE (GLUCOTROL XL) 5 MG 24  hr tablet Take 5 mg by mouth 3 (three) times a week. Mon, Wed, Fri    [provider]  insulin glargine (LANTUS) 100 UNIT/ML injection Inject 0.18 mLs (18 Units total) into the skin at bedtime. 09/02/17   Leana Gamer, MD  levothyroxine (SYNTHROID) 175 MCG tablet Take 175 mcg by mouth daily before breakfast.    [provider]  losartan (COZAAR) 100 MG tablet Take 0.5 tablets (50 mg total) by mouth daily. 05/15/21   Dorena Dew, FNP  LUMIGAN 0.01 % SOLN Place 1 drop into both eyes at bedtime. Separate by at least 10 minutes from other intraocular pressure reducing ophthalmic drugs 06/06/19   [provider]  meclizine (ANTIVERT) 25 MG tablet Take 25 mg by mouth 3 (three) times daily as needed for dizziness. 04/25/21   [provider]  metoprolol succinate (TOPROL-XL) 50 MG 24 hr tablet Take 50 mg by mouth at bedtime. Take with or immediately following a meal.    [provider]  Oxycodone HCl 10 MG TABS Take 0.5 tablets (5 mg total) by mouth every 6 (six) hours as needed. 05/15/21   Dorena Dew, FNP  RHOPRESSA 0.02 % SOLN Place 1 drop into the left eye at bedtime. 02/16/21   [provider]  vitamin C (ASCORBIC ACID) 500 MG tablet Take 500  mg by mouth daily.    [provider]      Allergies    Bee venom and Nsaids    Review of Systems   Review of Systems  All other systems reviewed and are negative.  Physical Exam Updated Vital Signs BP (!) 152/62 (BP Location: Right Arm)    Pulse 62    Temp 98.5 F (36.9 C) (Oral)    Resp 14    Ht 5\' 3"  (1.6 m)    Wt 66.7 kg    SpO2 98%    BMI 26.04 kg/m  Physical Exam Vitals and nursing note reviewed.  76 year old female, resting comfortably and in no acute distress. Vital signs are significant for elevated blood pressure. Oxygen saturation is 98%, which is normal. Head is normocephalic and atraumatic. PERRLA, EOMI. Oropharynx is clear. Neck is nontender and supple without  adenopathy or JVD. Back is mildly tender over the coccyx.  There is no other spinal tenderness.  There is no CVA tenderness. Lungs are clear without rales, wheezes, or rhonchi. Chest is nontender. Heart has regular rate and rhythm without murmur. Abdomen is soft, flat, nontender. Extremities have no cyanosis or edema, full range of motion is present. Skin is warm and dry without rash. Neurologic: Mental status is normal, cranial nerves are intact, moves all extremities equally.  ED Results / Procedures / Treatments   Labs (all labs ordered are listed, but only abnormal results are displayed) Labs Reviewed  CBC WITH DIFFERENTIAL/PLATELET  RETICULOCYTES  COMPREHENSIVE METABOLIC PANEL  URINALYSIS, ROUTINE W REFLEX MICROSCOPIC    EKG None  Radiology No results found.  Procedures Procedures    Medications Ordered in ED Medications  0.45 % sodium chloride infusion (has no administration in time range)  HYDROmorphone (DILAUDID) injection 2 mg (has no administration in time range)  HYDROmorphone (DILAUDID) injection 2 mg (has no administration in time range)  ondansetron (ZOFRAN) injection 4 mg (has no administration in time range)    ED Course/ Medical Decision Making/ A&P                           Medical Decision Making  Sickle cell exacerbation with concern about new pain in the coccyx region as this is an area that she normally does not have sickle cell pain at.  She will be started on sickle cell pain protocol and will send for CT of the pelvis to evaluate the coccyx.  Old records are reviewed confirming occasional ED visits and hospitalizations for sickle cell disease, most recent hospitalization on 05/09/2021.  He is a signed out to Dr. Francia Greaves.        Final Clinical Impression(s) / ED Diagnoses Final diagnoses:  Sickle cell pain crisis [facility])    Rx / DC Orders ED Discharge Orders     None         Delora Fuel, MD 77/82/42 [phone]

## 2021-12-11 NOTE — ED Notes (Signed)
Pt offered 2nd dose of dilaudid, pt denies pain at this time.

## 2021-12-11 NOTE — ED Notes (Signed)
D/c instructions reviewed with pt. Pt understands. Pt husband called to inform pt is ready for d/c and needs to be picked up. No answer from husband.

## 2021-12-11 NOTE — ED Provider Notes (Signed)
Patient seen after prior EDP.  Patient is pain-free at time of reevaluation.  She reports feeling significantly improved.  Screening labs and imaging is without significant acute abnormality.  Patient offered admission for continued work-up and pain control.  She declined same.  She understands need for close follow-up with her regular PCP.  She feels that she will be more comfortable at home.  Strict return precautions given and understood.  Importance of close follow-up is repeatedly stressed.   Valarie Merino, MD 12/11/21 [phone]

## 2021-12-11 NOTE — ED Triage Notes (Signed)
Pt reports with SCC that initially started Sunday. Pt states that it started in her left thigh, went to her right shoulder and upper back, and is now at her tailbone.

## 2022-02-18 ENCOUNTER — Emergency Department (HOSPITAL_COMMUNITY): Payer: 59

## 2022-02-18 ENCOUNTER — Other Ambulatory Visit: Payer: Self-pay

## 2022-02-18 ENCOUNTER — Emergency Department (HOSPITAL_COMMUNITY)
Admission: EM | Admit: 2022-02-18 | Discharge: 2022-02-18 | Disposition: A | Discharge status: Home / Self Care | Payer: 59 | Source: Home / Self Care | Attending: Emergency Medicine | Admitting: Emergency Medicine

## 2022-02-18 ENCOUNTER — Encounter (HOSPITAL_COMMUNITY): Payer: Self-pay

## 2022-02-18 DIAGNOSIS — D5700 Hb-SS disease with crisis, unspecified: Secondary | ICD-10-CM

## 2022-02-18 DIAGNOSIS — Z7982 Long term (current) use of aspirin: Secondary | ICD-10-CM | POA: Insufficient documentation

## 2022-02-18 DIAGNOSIS — Z794 Long term (current) use of insulin: Secondary | ICD-10-CM | POA: Insufficient documentation

## 2022-02-18 DIAGNOSIS — Z79899 Other long term (current) drug therapy: Secondary | ICD-10-CM | POA: Insufficient documentation

## 2022-02-18 DIAGNOSIS — D5701 Hb-SS disease with acute chest syndrome: Secondary | ICD-10-CM | POA: Diagnosis not present

## 2022-02-18 DIAGNOSIS — R4182 Altered mental status, unspecified: Secondary | ICD-10-CM

## 2022-02-18 LAB — CBC WITH DIFFERENTIAL/PLATELET
Abs Immature Granulocytes: 0.09 10*3/uL — ABNORMAL HIGH (ref 0.00–0.07)
Basophils Absolute: 0.1 10*3/uL (ref 0.0–0.1)
Basophils Relative: 0 %
Eosinophils Absolute: 0 10*3/uL (ref 0.0–0.5)
Eosinophils Relative: 0 %
HCT: 32.7 % — ABNORMAL LOW (ref 36.0–46.0)
Hemoglobin: 11.7 g/dL — ABNORMAL LOW (ref 12.0–15.0)
Immature Granulocytes: 1 %
Lymphocytes Relative: 13 %
Lymphs Abs: 1.6 10*3/uL (ref 0.7–4.0)
MCH: 29.9 pg (ref 26.0–34.0)
MCHC: 35.8 g/dL (ref 30.0–36.0)
MCV: 83.6 fL (ref 80.0–100.0)
Monocytes Absolute: 0.8 10*3/uL (ref 0.1–1.0)
Monocytes Relative: 6 %
Neutro Abs: 10.4 10*3/uL — ABNORMAL HIGH (ref 1.7–7.7)
Neutrophils Relative %: 80 %
Platelets: 238 10*3/uL (ref 150–400)
RBC: 3.91 MIL/uL (ref 3.87–5.11)
RDW: 15.9 % — ABNORMAL HIGH (ref 11.5–15.5)
WBC: 12.9 10*3/uL — ABNORMAL HIGH (ref 4.0–10.5)
nRBC: 0.9 % — ABNORMAL HIGH (ref 0.0–0.2)

## 2022-02-18 LAB — BASIC METABOLIC PANEL
Anion gap: 10 (ref 5–15)
BUN: 23 mg/dL (ref 8–23)
CO2: 23 mmol/L (ref 22–32)
Calcium: 9.9 mg/dL (ref 8.9–10.3)
Chloride: 106 mmol/L (ref 98–111)
Creatinine, Ser: 1.35 mg/dL — ABNORMAL HIGH (ref 0.44–1.00)
GFR, Estimated: 41 mL/min — ABNORMAL LOW (ref >60)
Glucose, Bld: 216 mg/dL — ABNORMAL HIGH (ref 70–99)
Potassium: 4.2 mmol/L (ref 3.5–5.1)
Sodium: 139 mmol/L (ref 135–145)

## 2022-02-18 LAB — URINALYSIS, ROUTINE W REFLEX MICROSCOPIC
Bacteria, UA: NONE SEEN
Bilirubin Urine: NEGATIVE
Glucose, UA: NEGATIVE mg/dL
Hgb urine dipstick: NEGATIVE
Ketones, ur: NEGATIVE mg/dL
Leukocytes,Ua: NEGATIVE
Nitrite: NEGATIVE
Protein, ur: >=300 mg/dL — AB
Specific Gravity, Urine: 1.011 (ref 1.005–1.030)
pH: 6 (ref 5.0–8.0)

## 2022-02-18 LAB — RETICULOCYTES
Immature Retic Fract: 30 % — ABNORMAL HIGH (ref 2.3–15.9)
RBC.: 3.89 MIL/uL (ref 3.87–5.11)
Retic Count, Absolute: 149.8 10*3/uL (ref 19.0–186.0)
Retic Ct Pct: 3.9 % — ABNORMAL HIGH (ref 0.4–3.1)

## 2022-02-18 LAB — TROPONIN I (HIGH SENSITIVITY)
Troponin I (High Sensitivity): 4 ng/L (ref <18)
Troponin I (High Sensitivity): 4 ng/L (ref <18)

## 2022-02-18 MED ORDER — SODIUM CHLORIDE 0.45 % IV SOLN: INTRAVENOUS | Status: DC

## 2022-02-18 MED ORDER — ONDANSETRON HCL 4 MG/2ML IJ SOLN
4.0000 mg | INTRAMUSCULAR | Status: DC | PRN
  Administered 2022-02-18: 4 mg via INTRAVENOUS
  Filled 2022-02-18 (×2): qty 2

## 2022-02-18 MED ORDER — HYDROMORPHONE HCL 1 MG/ML IJ SOLN
1.0000 mg | Freq: Once | INTRAMUSCULAR | Status: AC
  Administered 2022-02-18: 1 mg via INTRAVENOUS
  Filled 2022-02-18: qty 1

## 2022-02-18 NOTE — ED Provider Notes (Signed)
?[facility] DEPT ?Provider Note ? ? ?CSN: [id number] ?Arrival date & time: 02/18/22  1353 ? ?  ? ?History ? ?Chief Complaint  ?Patient presents with  ? Sickle Cell Pain Crisis  ? ? ?[name] is a 76 y.o. female. ? ?76 year old female with prior medical history as detailed below presents for evaluation.  Patient with longstanding history of sickle cell.  Patient reports increased pain to the left shoulder and left arm.  Per her report, this is consistent with her typical sickle cell crises.  She took her regular pain medications at home without significant improvement in her symptoms. ?She denies associated chest pain or shortness of breath.  She denies fever.  She denies other complaint. ? ? ?The history is provided by the patient and medical records.  ?Sickle Cell Pain Crisis ?Location:  Upper extremity ?Severity:  Mild ?Onset quality:  Gradual ?Duration:  1 day ?Similar to previous crisis episodes: yes   ?Timing:  Sporadic ?Progression:  Unchanged ?Chronicity:  New ? ?  ? ?Home Medications ?Prior to Admission medications   ?Medication Sig Start Date End Date Taking? Authorizing Provider  ?acetaminophen (TYLENOL) 500 MG tablet Take 1,000 mg by mouth every 6 (six) hours as needed for mild pain or moderate pain.     [provider]  ?amLODipine (NORVASC) 10 MG tablet Take 10 mg by mouth daily.    [provider]  ?aspirin EC 81 MG tablet Take 81 mg by mouth daily.    [provider]  ?atorvastatin (LIPITOR) 40 MG tablet Take 40 mg by mouth at bedtime.     [provider]  ?Cholecalciferol (VITAMIN D3) 1.25 MG (50000 UT) CAPS Take 50,000 capsules by mouth once a week. 04/19/21   [provider]  ?cycloSPORINE (RESTASIS) 0.05 % ophthalmic emulsion Place 1 drop into both eyes 2 (two) times daily.    [provider]  ?ferrous sulfate 325 (65 FE) MG tablet Take 325 mg by mouth daily with breakfast.    [provider]   ?folic acid (FOLVITE) 1 MG tablet Take 1 mg by mouth daily.    [provider]  ?glipiZIDE (GLUCOTROL XL) 5 MG 24 hr tablet Take 5 mg by mouth 3 (three) times a week. Mon, Wed, Fri    [provider]  ?insulin glargine (LANTUS) 100 UNIT/ML injection Inject 0.18 mLs (18 Units total) into the skin at bedtime. 09/02/17   Leana Gamer, MD  ?levothyroxine (SYNTHROID) 175 MCG tablet Take 175 mcg by mouth daily before breakfast.    [provider]  ?losartan (COZAAR) 100 MG tablet Take 0.5 tablets (50 mg total) by mouth daily. 05/15/21   Dorena Dew, FNP  ?LUMIGAN 0.01 % SOLN Place 1 drop into both eyes at bedtime. Separate by at least 10 minutes from other intraocular pressure reducing ophthalmic drugs 06/06/19   [provider]  ?meclizine (ANTIVERT) 25 MG tablet Take 25 mg by mouth 3 (three) times daily as needed for dizziness. 04/25/21   [provider]  ?metoprolol succinate (TOPROL-XL) 50 MG 24 hr tablet Take 50 mg by mouth at bedtime. Take with or immediately following a meal.    [provider]  ?Oxycodone HCl 10 MG TABS Take 0.5 tablets (5 mg total) by mouth every 6 (six) hours as needed. 05/15/21   Dorena Dew, FNP  ?RHOPRESSA 0.02 % SOLN Place 1 drop into the left eye at bedtime. 02/16/21   [provider]  ?vitamin  C (ASCORBIC ACID) 500 MG tablet Take 500 mg by mouth daily.    [provider]  ?   ? ?Allergies    ?Bee venom and Nsaids   ? ?Review of Systems   ?Review of Systems  ?All other systems reviewed and are negative. ? ?Physical Exam ?Updated Vital Signs ?BP (!) 175/64   Pulse 64   Temp 98.2 ?F (36.8 ?C) (Oral)   Resp 14   Ht 5\' 3"  (1.6 m)   Wt 65.8 kg   SpO2 95%   BMI 25.69 kg/m?  ?Physical Exam ?Vitals and nursing note reviewed.  ?Constitutional:   ?   General: She is not in acute distress. ?   Appearance: Normal appearance. She is well-developed.  ?HENT:  ?   Head: Normocephalic and atraumatic.  ?Eyes:  ?    Conjunctiva/sclera: Conjunctivae normal.  ?   Pupils: Pupils are equal, round, and reactive to light.  ?Cardiovascular:  ?   Rate and Rhythm: Normal rate and regular rhythm.  ?   Heart sounds: Normal heart sounds.  ?Pulmonary:  ?   Effort: Pulmonary effort is normal. No respiratory distress.  ?   Breath sounds: Normal breath sounds.  ?Abdominal:  ?   General: There is no distension.  ?   Palpations: Abdomen is soft.  ?   Tenderness: There is no abdominal tenderness.  ?Musculoskeletal:     ?   General: No deformity. Normal range of motion.  ?   Cervical back: Normal range of motion and neck supple.  ?Skin: ?   General: Skin is warm and dry.  ?Neurological:  ?   General: No focal deficit present.  ?   Mental Status: She is alert and oriented to person, place, and time.  ? ? ?ED Results / Procedures / Treatments   ?Labs ?(all labs ordered are listed, but only abnormal results are displayed) ?Labs Reviewed  ?RETICULOCYTES - Abnormal; Notable for the following components:  ?    Result Value  ? Retic Ct Pct 3.9 (*)   ? Immature Retic Fract 30.0 (*)   ? All other components within normal limits  ?CBC WITH DIFFERENTIAL/PLATELET - Abnormal; Notable for the following components:  ? WBC 12.9 (*)   ? Hemoglobin 11.7 (*)   ? HCT 32.7 (*)   ? RDW 15.9 (*)   ? nRBC 0.9 (*)   ? Neutro Abs 10.4 (*)   ? Abs Immature Granulocytes 0.09 (*)   ? All other components within normal limits  ?BASIC METABOLIC PANEL - Abnormal; Notable for the following components:  ? Glucose, Bld 216 (*)   ? Creatinine, Ser 1.35 (*)   ? GFR, Estimated 41 (*)   ? All other components within normal limits  ?URINALYSIS, ROUTINE W REFLEX MICROSCOPIC - Abnormal; Notable for the following components:  ? Color, Urine STRAW (*)   ? Protein, ur >=300 (*)   ? All other components within normal limits  ?TROPONIN I (HIGH SENSITIVITY)  ? ? ?EKG ?EKG Interpretation ? ?Date/Time:  Tuesday February 18 2022 15:04:05 EDT ?Ventricular Rate:  64 ?PR Interval:  191 ?QRS  Duration: 84 ?QT Interval:  428 ?QTC Calculation: 442 ?R Axis:   -4 ?Text Interpretation: Sinus rhythm Atrial premature complex Left ventricular hypertrophy Confirmed by Dene Gentry [phone]) on 02/18/2022 3:17:40 PM ? ?Radiology ?DG Chest 2 View ? ?Result Date: 02/18/2022 ?CLINICAL DATA:  thoracic pain EXAM: CHEST - 2 VIEW COMPARISON:  May 11, 2021. FINDINGS: Mild streaky left basilar opacities.  No confluent consolidation. No visible pleural effusions or pneumothorax. Cardiomediastinal silhouette is borderline enlarged, similar. No evidence of acute osseous abnormality. IMPRESSION: Mild streaky left basilar opacities, favor atelectasis/scar. Electronically Signed   By: Margaretha Sheffield M.D.   On: 02/18/2022 15:32   ? ?Procedures ?Procedures  ? ? ?Medications Ordered in ED ?Medications  ?0.45 % sodium chloride infusion ( Intravenous New Bag/Given 02/18/22 1539)  ?ondansetron (ZOFRAN) injection 4 mg (4 mg Intravenous Given 02/18/22 1538)  ?HYDROmorphone (DILAUDID) injection 1 mg (1 mg Intravenous Given 02/18/22 1537)  ? ? ?ED Course/ Medical Decision Making/ A&P ?  ?                        ?Medical Decision Making ?Risk ?Prescription drug management. ? ? ? ?Medical Screen Complete ? ?This patient presented to the ED with complaint of left arm pain consistent with prior sickle cell crisis. ? ?This complaint involves an extensive number of treatment options. The initial differential diagnosis includes, but is not limited to, sickle cell painful crisis, ACS, metabolic abnormality, infection, etc. ? ?This presentation is: Acute, Chronic, Self-Limited, Previously Undiagnosed, Uncertain Prognosis, Complicated, Systemic Symptoms, and Threat to Life/Bodily Function ? ?Patient is presenting with complaint of left arm discomfort.  She describes this discomfort is typical for her sickle cell crises.  Use of home medication was inadequate for pain control. ? ?ED evaluation is without evidence of acute pathology. ? ?Notably,  patient's EKG is without evidence of acute ischemia.  Troponin x2 is unremarkable. ? ?Other screening labs obtained are without significant pathology. ? ?Patient feels significantly improved after ED evaluation and treatment. ? ?She

## 2022-02-18 NOTE — ED Provider Triage Note (Signed)
Emergency Medicine Provider Triage Evaluation Note ? ?[name] , a 76 y.o. female  was evaluated in triage.  Pt complains of sickle cell pain onset 2 AM today. She has a history of sick cell anemia. She tried her typical medications without relief of her symptoms. Tried her hydrocodone with no relief of her symptoms. She didn't call her doctor and inform of her current crisis. She attempted to go into the sickle cell clinic today, she has never been before. Denies chest pain, shortness of breath, abdominal pain, nausea, vomiting, fever, chills.  ? ?Review of Systems  ?Positive: As per HPI above ?Negative:  ? ?Physical Exam  ?BP (!) 178/63 (BP Location: Right Arm)   Pulse 67   Temp 98.2 ?F (36.8 ?C) (Oral)   Resp 18   Ht 5\' 3"  (1.6 m)   Wt 65.8 kg   SpO2 100%   BMI 25.69 kg/m?  ?Gen:   Awake, no distress   ?Resp:  Normal effort  ?MSK:   Moves extremities without difficulty  ?Other:  Mild TTP to thoracic musculature. No spinal TTP. No chest wall TTP.  ? ?Medical Decision Making  ?Medically screening exam initiated at 2:28 PM.  Appropriate orders placed.  [name] was informed that the remainder of the evaluation will be completed by another provider, this initial triage assessment does not replace that evaluation, and the importance of remaining in the ED until their evaluation is complete. ? ?2:29 PM - Discussed with RN that patient is in need of a room immediately. RN aware and working on room placement.  ?  ?Chandrea Zellman A, PA-C ?02/18/22 1443 ? ?

## 2022-02-18 NOTE — ED Triage Notes (Signed)
Patient c/o sickle cell pain from the left lateral neck down the left arm since 0200 today. ?

## 2022-02-18 NOTE — Discharge Instructions (Signed)
Return for any problem.  ?

## 2022-02-20 ENCOUNTER — Emergency Department (HOSPITAL_COMMUNITY): Payer: 59

## 2022-02-20 ENCOUNTER — Other Ambulatory Visit: Payer: Self-pay

## 2022-02-20 ENCOUNTER — Inpatient Hospital Stay (HOSPITAL_COMMUNITY)
Admission: EM | Admit: 2022-02-20 | Discharge: 2022-03-01 | DRG: 811 | Disposition: E | Payer: 59 | Attending: Internal Medicine | Admitting: Internal Medicine

## 2022-02-20 ENCOUNTER — Encounter (HOSPITAL_COMMUNITY): Payer: Self-pay

## 2022-02-20 DIAGNOSIS — Z7189 Other specified counseling: Secondary | ICD-10-CM

## 2022-02-20 DIAGNOSIS — Z87891 Personal history of nicotine dependence: Secondary | ICD-10-CM

## 2022-02-20 DIAGNOSIS — Z794 Long term (current) use of insulin: Secondary | ICD-10-CM | POA: Diagnosis not present

## 2022-02-20 DIAGNOSIS — Z886 Allergy status to analgesic agent status: Secondary | ICD-10-CM

## 2022-02-20 DIAGNOSIS — Z20822 Contact with and (suspected) exposure to covid-19: Secondary | ICD-10-CM | POA: Diagnosis present

## 2022-02-20 DIAGNOSIS — Z7984 Long term (current) use of oral hypoglycemic drugs: Secondary | ICD-10-CM

## 2022-02-20 DIAGNOSIS — E1122 Type 2 diabetes mellitus with diabetic chronic kidney disease: Secondary | ICD-10-CM | POA: Diagnosis present

## 2022-02-20 DIAGNOSIS — D649 Anemia, unspecified: Secondary | ICD-10-CM | POA: Diagnosis not present

## 2022-02-20 DIAGNOSIS — E871 Hypo-osmolality and hyponatremia: Secondary | ICD-10-CM | POA: Diagnosis not present

## 2022-02-20 DIAGNOSIS — N179 Acute kidney failure, unspecified: Secondary | ICD-10-CM | POA: Diagnosis present

## 2022-02-20 DIAGNOSIS — I130 Hypertensive heart and chronic kidney disease with heart failure and stage 1 through stage 4 chronic kidney disease, or unspecified chronic kidney disease: Secondary | ICD-10-CM | POA: Diagnosis present

## 2022-02-20 DIAGNOSIS — J690 Pneumonitis due to inhalation of food and vomit: Secondary | ICD-10-CM | POA: Diagnosis not present

## 2022-02-20 DIAGNOSIS — G894 Chronic pain syndrome: Secondary | ICD-10-CM | POA: Diagnosis not present

## 2022-02-20 DIAGNOSIS — R609 Edema, unspecified: Secondary | ICD-10-CM | POA: Diagnosis not present

## 2022-02-20 DIAGNOSIS — E1110 Type 2 diabetes mellitus with ketoacidosis without coma: Secondary | ICD-10-CM | POA: Diagnosis not present

## 2022-02-20 DIAGNOSIS — D5701 Hb-SS disease with acute chest syndrome: Principal | ICD-10-CM | POA: Diagnosis present

## 2022-02-20 DIAGNOSIS — R4020 Unspecified coma: Secondary | ICD-10-CM | POA: Diagnosis not present

## 2022-02-20 DIAGNOSIS — Z833 Family history of diabetes mellitus: Secondary | ICD-10-CM

## 2022-02-20 DIAGNOSIS — D638 Anemia in other chronic diseases classified elsewhere: Secondary | ICD-10-CM | POA: Diagnosis present

## 2022-02-20 DIAGNOSIS — G928 Other toxic encephalopathy: Secondary | ICD-10-CM | POA: Diagnosis present

## 2022-02-20 DIAGNOSIS — D696 Thrombocytopenia, unspecified: Secondary | ICD-10-CM

## 2022-02-20 DIAGNOSIS — D5700 Hb-SS disease with crisis, unspecified: Secondary | ICD-10-CM | POA: Diagnosis not present

## 2022-02-20 DIAGNOSIS — E875 Hyperkalemia: Secondary | ICD-10-CM | POA: Diagnosis not present

## 2022-02-20 DIAGNOSIS — R0603 Acute respiratory distress: Secondary | ICD-10-CM | POA: Diagnosis not present

## 2022-02-20 DIAGNOSIS — Z7982 Long term (current) use of aspirin: Secondary | ICD-10-CM

## 2022-02-20 DIAGNOSIS — I248 Other forms of acute ischemic heart disease: Secondary | ICD-10-CM | POA: Diagnosis present

## 2022-02-20 DIAGNOSIS — I272 Pulmonary hypertension, unspecified: Secondary | ICD-10-CM | POA: Diagnosis present

## 2022-02-20 DIAGNOSIS — R410 Disorientation, unspecified: Secondary | ICD-10-CM | POA: Diagnosis not present

## 2022-02-20 DIAGNOSIS — I2510 Atherosclerotic heart disease of native coronary artery without angina pectoris: Secondary | ICD-10-CM | POA: Diagnosis present

## 2022-02-20 DIAGNOSIS — R578 Other shock: Secondary | ICD-10-CM | POA: Diagnosis not present

## 2022-02-20 DIAGNOSIS — D65 Disseminated intravascular coagulation [defibrination syndrome]: Secondary | ICD-10-CM | POA: Diagnosis present

## 2022-02-20 DIAGNOSIS — E039 Hypothyroidism, unspecified: Secondary | ICD-10-CM | POA: Diagnosis present

## 2022-02-20 DIAGNOSIS — N184 Chronic kidney disease, stage 4 (severe): Secondary | ICD-10-CM | POA: Diagnosis present

## 2022-02-20 DIAGNOSIS — G9340 Encephalopathy, unspecified: Secondary | ICD-10-CM | POA: Diagnosis not present

## 2022-02-20 DIAGNOSIS — Z91030 Bee allergy status: Secondary | ICD-10-CM

## 2022-02-20 DIAGNOSIS — K922 Gastrointestinal hemorrhage, unspecified: Secondary | ICD-10-CM | POA: Diagnosis not present

## 2022-02-20 DIAGNOSIS — H401132 Primary open-angle glaucoma, bilateral, moderate stage: Secondary | ICD-10-CM | POA: Diagnosis present

## 2022-02-20 DIAGNOSIS — I5021 Acute systolic (congestive) heart failure: Secondary | ICD-10-CM | POA: Diagnosis present

## 2022-02-20 DIAGNOSIS — R401 Stupor: Secondary | ICD-10-CM | POA: Diagnosis not present

## 2022-02-20 DIAGNOSIS — E119 Type 2 diabetes mellitus without complications: Secondary | ICD-10-CM

## 2022-02-20 DIAGNOSIS — M3119 Other thrombotic microangiopathy: Secondary | ICD-10-CM | POA: Diagnosis present

## 2022-02-20 DIAGNOSIS — R579 Shock, unspecified: Secondary | ICD-10-CM | POA: Diagnosis not present

## 2022-02-20 DIAGNOSIS — J9601 Acute respiratory failure with hypoxia: Secondary | ICD-10-CM | POA: Diagnosis not present

## 2022-02-20 DIAGNOSIS — I468 Cardiac arrest due to other underlying condition: Secondary | ICD-10-CM | POA: Diagnosis not present

## 2022-02-20 DIAGNOSIS — I10 Essential (primary) hypertension: Secondary | ICD-10-CM | POA: Diagnosis present

## 2022-02-20 DIAGNOSIS — D57211 Sickle-cell/Hb-C disease with acute chest syndrome: Secondary | ICD-10-CM | POA: Diagnosis present

## 2022-02-20 DIAGNOSIS — Z79890 Hormone replacement therapy (postmenopausal): Secondary | ICD-10-CM

## 2022-02-20 DIAGNOSIS — E113593 Type 2 diabetes mellitus with proliferative diabetic retinopathy without macular edema, bilateral: Secondary | ICD-10-CM | POA: Diagnosis present

## 2022-02-20 DIAGNOSIS — Z79899 Other long term (current) drug therapy: Secondary | ICD-10-CM

## 2022-02-20 DIAGNOSIS — E872 Acidosis, unspecified: Secondary | ICD-10-CM | POA: Diagnosis not present

## 2022-02-20 LAB — CBC WITH DIFFERENTIAL/PLATELET
Abs Immature Granulocytes: 0.05 10*3/uL (ref 0.00–0.07)
Basophils Absolute: 0 10*3/uL (ref 0.0–0.1)
Basophils Relative: 0 %
Eosinophils Absolute: 0.1 10*3/uL (ref 0.0–0.5)
Eosinophils Relative: 1 %
HCT: 31 % — ABNORMAL LOW (ref 36.0–46.0)
Hemoglobin: 11.2 g/dL — ABNORMAL LOW (ref 12.0–15.0)
Immature Granulocytes: 0 %
Lymphocytes Relative: 15 %
Lymphs Abs: 1.7 10*3/uL (ref 0.7–4.0)
MCH: 30.6 pg (ref 26.0–34.0)
MCHC: 36.1 g/dL — ABNORMAL HIGH (ref 30.0–36.0)
MCV: 84.7 fL (ref 80.0–100.0)
Monocytes Absolute: 1 10*3/uL (ref 0.1–1.0)
Monocytes Relative: 9 %
Neutro Abs: 8.4 10*3/uL — ABNORMAL HIGH (ref 1.7–7.7)
Neutrophils Relative %: 75 %
Platelets: 205 10*3/uL (ref 150–400)
RBC: 3.66 MIL/uL — ABNORMAL LOW (ref 3.87–5.11)
RDW: 15.5 % (ref 11.5–15.5)
WBC: 11.3 10*3/uL — ABNORMAL HIGH (ref 4.0–10.5)
nRBC: 0.9 % — ABNORMAL HIGH (ref 0.0–0.2)

## 2022-02-20 LAB — GLUCOSE, CAPILLARY
Glucose-Capillary: 253 mg/dL — ABNORMAL HIGH (ref 70–99)
Glucose-Capillary: 204 mg/dL — ABNORMAL HIGH (ref 70–99)

## 2022-02-20 LAB — COMPREHENSIVE METABOLIC PANEL
ALT: 15 U/L (ref 0–44)
AST: 17 U/L (ref 15–41)
Albumin: 3.7 g/dL (ref 3.5–5.0)
Alkaline Phosphatase: 93 U/L (ref 38–126)
Anion gap: 8 (ref 5–15)
BUN: 25 mg/dL — ABNORMAL HIGH (ref 8–23)
CO2: 22 mmol/L (ref 22–32)
Calcium: 9.3 mg/dL (ref 8.9–10.3)
Chloride: 108 mmol/L (ref 98–111)
Creatinine, Ser: 1.65 mg/dL — ABNORMAL HIGH (ref 0.44–1.00)
GFR, Estimated: 33 mL/min — ABNORMAL LOW (ref >60)
Glucose, Bld: 284 mg/dL — ABNORMAL HIGH (ref 70–99)
Potassium: 4.2 mmol/L (ref 3.5–5.1)
Sodium: 138 mmol/L (ref 135–145)
Total Bilirubin: 0.9 mg/dL (ref 0.3–1.2)
Total Protein: 7.1 g/dL (ref 6.5–8.1)

## 2022-02-20 LAB — TROPONIN I (HIGH SENSITIVITY)
Troponin I (High Sensitivity): 6 ng/L (ref <18)
Troponin I (High Sensitivity): 6 ng/L (ref <18)

## 2022-02-20 LAB — RETICULOCYTES
Immature Retic Fract: 29.7 % — ABNORMAL HIGH (ref 2.3–15.9)
RBC.: 3.7 MIL/uL — ABNORMAL LOW (ref 3.87–5.11)
Retic Count, Absolute: 133.6 10*3/uL (ref 19.0–186.0)
Retic Ct Pct: 3.6 % — ABNORMAL HIGH (ref 0.4–3.1)

## 2022-02-20 LAB — LIPASE, BLOOD: Lipase: 54 U/L — ABNORMAL HIGH (ref 11–51)

## 2022-02-20 LAB — D-DIMER, QUANTITATIVE: D-Dimer, Quant: 1.59 ug/mL-FEU — ABNORMAL HIGH (ref 0.00–0.50)

## 2022-02-20 MED ORDER — INSULIN ASPART 100 UNIT/ML IJ SOLN
0.0000 [IU] | Freq: Three times a day (TID) | INTRAMUSCULAR | Status: DC
  Administered 2022-02-20: 5 [IU] via SUBCUTANEOUS
  Administered 2022-02-21 (×2): 1 [IU] via SUBCUTANEOUS
  Administered 2022-02-21: 3 [IU] via SUBCUTANEOUS

## 2022-02-20 MED ORDER — LATANOPROST 0.005 % OP SOLN
1.0000 [drp] | Freq: Every day | OPHTHALMIC | Status: DC
  Administered 2022-02-20 – 2022-02-22 (×3): 1 [drp] via OPHTHALMIC
  Filled 2022-02-20 (×2): qty 2.5

## 2022-02-20 MED ORDER — DIPHENHYDRAMINE HCL 25 MG PO CAPS
25.0000 mg | ORAL_CAPSULE | ORAL | Status: DC | PRN
  Administered 2022-02-20: 25 mg via ORAL
  Filled 2022-02-20: qty 1

## 2022-02-20 MED ORDER — SODIUM CHLORIDE 0.9% FLUSH
9.0000 mL | INTRAVENOUS | Status: DC | PRN
Start: 2022-02-20 — End: 2022-02-21

## 2022-02-20 MED ORDER — NALOXONE HCL 0.4 MG/ML IJ SOLN: 0.4000 mg | INTRAMUSCULAR | Status: DC | PRN

## 2022-02-20 MED ORDER — ONDANSETRON HCL 4 MG/2ML IJ SOLN: 4.0000 mg | Freq: Four times a day (QID) | INTRAMUSCULAR | Status: DC | PRN

## 2022-02-20 MED ORDER — SODIUM CHLORIDE 0.9 % IV BOLUS
500.0000 mL | Freq: Once | INTRAVENOUS | Status: AC
  Administered 2022-02-20: 500 mL via INTRAVENOUS

## 2022-02-20 MED ORDER — HYDROMORPHONE HCL 1 MG/ML IJ SOLN
1.0000 mg | Freq: Once | INTRAMUSCULAR | Status: AC
  Administered 2022-02-20: 1 mg via INTRAVENOUS
  Filled 2022-02-20: qty 1

## 2022-02-20 MED ORDER — POLYETHYLENE GLYCOL 3350 17 G PO PACK
17.0000 g | PACK | Freq: Every day | ORAL | Status: DC | PRN
Start: 2022-02-20 — End: 2022-02-23

## 2022-02-20 MED ORDER — CYCLOSPORINE 0.05 % OP EMUL
1.0000 [drp] | Freq: Two times a day (BID) | OPHTHALMIC | Status: DC
  Administered 2022-02-20 – 2022-02-23 (×6): 1 [drp] via OPHTHALMIC
  Filled 2022-02-20 (×8): qty 30

## 2022-02-20 MED ORDER — OXYCODONE HCL 5 MG PO TABS
5.0000 mg | ORAL_TABLET | Freq: Four times a day (QID) | ORAL | Status: DC | PRN
  Administered 2022-02-20 – 2022-02-21 (×2): 5 mg via ORAL
  Filled 2022-02-20 (×2): qty 1

## 2022-02-20 MED ORDER — LEVOTHYROXINE SODIUM 75 MCG PO TABS
175.0000 ug | ORAL_TABLET | Freq: Every day | ORAL | Status: DC
  Administered 2022-02-21 – 2022-02-23 (×2): 175 ug via ORAL
  Filled 2022-02-20 (×2): qty 1

## 2022-02-20 MED ORDER — LOSARTAN POTASSIUM 50 MG PO TABS
50.0000 mg | ORAL_TABLET | Freq: Every day | ORAL | Status: DC
  Administered 2022-02-20 – 2022-02-21 (×2): 50 mg via ORAL
  Filled 2022-02-20 (×2): qty 1

## 2022-02-20 MED ORDER — FERROUS SULFATE 325 (65 FE) MG PO TABS
325.0000 mg | ORAL_TABLET | Freq: Every day | ORAL | Status: DC
  Administered 2022-02-21 – 2022-02-23 (×2): 325 mg via ORAL
  Filled 2022-02-20 (×2): qty 1

## 2022-02-20 MED ORDER — IOHEXOL 350 MG/ML SOLN
100.0000 mL | Freq: Once | INTRAVENOUS | Status: AC | PRN
  Administered 2022-02-20: 80 mL via INTRAVENOUS

## 2022-02-20 MED ORDER — PANTOPRAZOLE SODIUM 40 MG IV SOLR
40.0000 mg | Freq: Once | INTRAVENOUS | Status: AC
  Administered 2022-02-20: 40 mg via INTRAVENOUS
  Filled 2022-02-20: qty 10

## 2022-02-20 MED ORDER — ENOXAPARIN SODIUM 40 MG/0.4ML IJ SOSY
40.0000 mg | PREFILLED_SYRINGE | INTRAMUSCULAR | Status: DC
  Administered 2022-02-20 – 2022-02-21 (×2): 40 mg via SUBCUTANEOUS
  Filled 2022-02-20 (×2): qty 0.4

## 2022-02-20 MED ORDER — ATORVASTATIN CALCIUM 40 MG PO TABS
40.0000 mg | ORAL_TABLET | Freq: Every day | ORAL | Status: DC
  Administered 2022-02-20: 40 mg via ORAL
  Filled 2022-02-20: qty 1

## 2022-02-20 MED ORDER — SENNOSIDES-DOCUSATE SODIUM 8.6-50 MG PO TABS
1.0000 | ORAL_TABLET | Freq: Two times a day (BID) | ORAL | Status: DC
  Administered 2022-02-20 – 2022-02-22 (×3): 1 via ORAL
  Filled 2022-02-20 (×3): qty 1

## 2022-02-20 MED ORDER — ASPIRIN EC 81 MG PO TBEC
81.0000 mg | DELAYED_RELEASE_TABLET | Freq: Every day | ORAL | Status: DC
  Administered 2022-02-20 – 2022-02-21 (×2): 81 mg via ORAL
  Filled 2022-02-20 (×2): qty 1

## 2022-02-20 MED ORDER — ONDANSETRON HCL 4 MG/2ML IJ SOLN
4.0000 mg | Freq: Once | INTRAMUSCULAR | Status: AC
  Administered 2022-02-20: 4 mg via INTRAVENOUS
  Filled 2022-02-20: qty 2

## 2022-02-20 MED ORDER — SODIUM CHLORIDE (PF) 0.9 % IJ SOLN
INTRAMUSCULAR | Status: AC
  Filled 2022-02-20: qty 50

## 2022-02-20 MED ORDER — ASCORBIC ACID 500 MG PO TABS
500.0000 mg | ORAL_TABLET | Freq: Every day | ORAL | Status: DC
  Administered 2022-02-21: 500 mg via ORAL
  Filled 2022-02-20 (×2): qty 1

## 2022-02-20 MED ORDER — FOLIC ACID 1 MG PO TABS
1.0000 mg | ORAL_TABLET | Freq: Every day | ORAL | Status: DC
  Administered 2022-02-21: 1 mg via ORAL
  Filled 2022-02-20 (×2): qty 1

## 2022-02-20 MED ORDER — METOPROLOL SUCCINATE ER 25 MG PO TB24
50.0000 mg | ORAL_TABLET | Freq: Every day | ORAL | Status: DC
  Administered 2022-02-20: 50 mg via ORAL
  Filled 2022-02-20: qty 1

## 2022-02-20 MED ORDER — SODIUM CHLORIDE 0.9 % IV BOLUS
1000.0000 mL | Freq: Once | INTRAVENOUS | Status: AC
  Administered 2022-02-20: 1000 mL via INTRAVENOUS

## 2022-02-20 MED ORDER — ACETAMINOPHEN 500 MG PO TABS: 1000.0000 mg | ORAL_TABLET | Freq: Four times a day (QID) | ORAL | Status: DC | PRN

## 2022-02-20 MED ORDER — NETARSUDIL DIMESYLATE 0.02 % OP SOLN
1.0000 [drp] | Freq: Every day | OPHTHALMIC | Status: DC
  Administered 2022-02-20: 1 [drp] via OPHTHALMIC

## 2022-02-20 MED ORDER — AMLODIPINE BESYLATE 10 MG PO TABS
10.0000 mg | ORAL_TABLET | Freq: Every day | ORAL | Status: DC
  Administered 2022-02-20 – 2022-02-21 (×2): 10 mg via ORAL
  Filled 2022-02-20 (×2): qty 1

## 2022-02-20 MED ORDER — HYDROMORPHONE 1 MG/ML IV SOLN
INTRAVENOUS | Status: DC
  Administered 2022-02-20: 1.9 mg via INTRAVENOUS
  Administered 2022-02-20: 30 mg via INTRAVENOUS
  Administered 2022-02-20: 2 mg via INTRAVENOUS
  Administered 2022-02-21: 1.5 mg via INTRAVENOUS
  Administered 2022-02-21: 1.4 mg via INTRAVENOUS
  Administered 2022-02-21: 1.2 mg via INTRAVENOUS
  Filled 2022-02-20: qty 30

## 2022-02-20 NOTE — ED Triage Notes (Signed)
Pt endorses pain to left leg and right under her breasts. Pt reports believing this is a sickle cell crisis. Pt was seen here 2 days ago for sickle cell crisis. Denies N/V/D.  ?

## 2022-02-20 NOTE — ED Notes (Addendum)
Called [facility] to speak to RN getting PT.  ? ?Sharrie Rothman, RN sent [facility] off message at [phone] via secure chat with no answer.  ? ?Caren Griffins, RN from [facility] is reviewing pts chart now and will turn handoff green when done.  ?

## 2022-02-20 NOTE — H&P (Signed)
?H&P ? Patient Demographics:  ?[name], is a 76 y.o. female  MRN: [id number]   DOB - 22-May-1946 ? ?Admit Date - 02/28/2022 ? ?Outpatient Primary MD for the patient is Vincente Liberty, MD ? ?Chief Complaint  ?Patient presents with  ? Sickle Cell Pain Crisis  ?  ? ? HPI:  ? ?[name]  is a 76 y.o. female with a medical history significant for sickle cell disease, anemia of chronic disease, type 2 diabetes mellitus, chronic kidney disease stage IIIb, essential hypertension, and acquired hypothyroidism presents to the emergency department with complaints of pain to anterior chest wall, and right lower extremity that is consistent with her previous sickle cell crisis.  Patient states that pain intensity has been "up-and-down" over the past 4 days.  Patient was treated and evaluated in the emergency department on 02/18/2022 for this problem and felt as if she could return home.  However, pain returned shortly after arriving home.  The patient has been taking her home oxycodone consistently without any sustained relief.  Patient reports that pain is worsening, prompting her to return to the emergency department.  She has not identified any palliative or provocative factors concerning crisis.  Patient currently denies any associated shortness of breath, palpitations, nausea, vomiting, dizziness, diaphoresis, or presyncope.  She has had no recent travel or known exposure to COVID-19.  Patient has not had any recent falls or injuries.  She denies any radiating chest pains at this time.  Overall, pain is very similar to previous sickle cell pain crises.  Patient has been taking all prescribed medications consistently. ? ?ER course: Vital signs recorded as BP (!) 167/61 (BP Location: Left Arm) Comment: RN notified  Pulse (!) 57   Temp (!) 97.5 ?F (36.4 ?C) (Oral)   Resp 17   Wt 66.1 kg   SpO2 100%   BMI 25.81 kg/m?  ?Complete blood count shows WBCs 11.3, hemoglobin 11.2 and platelets 205,000.  Comprehensive  metabolic panel shows glucose of 284, patient reports that she took her Lantus on last night, she did not check blood glucose prior to arrival. ?Also, CMP shows creatinine elevated at 1.65, BUN 25, and GFR 33.  Absolute reticulocytes 133.6.  Troponin negative.  Lipase is slightly elevated at 54.  CT of abdomen and pelvis shows no evidence of pulmonary embolism, no acute findings in the abdomen or pelvis, and aortic atherosclerosis.  CT angiogram shows no PE.  Patient's pain persists despite IV Dilaudid and IV fluids, patient thereby admitted for further work-up and evaluation of sickle cell pain crisis. ? ? Review of systems:  ?Review of Systems  ?Constitutional:  Negative for chills and fever.  ?HENT: Negative.    ?Eyes:  Negative for blurred vision and double vision.  ?Respiratory: Negative.    ?Cardiovascular:  Positive for chest pain. Negative for palpitations, orthopnea and leg swelling.  ?Gastrointestinal: Negative.  Negative for abdominal pain, heartburn and nausea.  ?Genitourinary: Negative.  Negative for frequency and hematuria.  ?Musculoskeletal:  Positive for back pain and joint pain.  ?Skin: Negative.   ?Psychiatric/Behavioral: Negative.    ? ? ?With Past History of the following :  ? ?Past Medical History:  ?Diagnosis Date  ? Abdominal pain   ? Coronary artery disease   ? Diabetes mellitus without complication ([place])   ? Glaucoma   ? Hypertension   ? Hypothyroid   ? Renal insufficiency 09/01/2017  ? Sickle cell anemia (H[facility])   ?   ? ?Past Surgical History:  ?Procedure  Laterality Date  ? CHOLECYSTECTOMY    ? HIP SURGERY    ? 6 yrs ago  ? REFRACTIVE SURGERY    ? RETINAL DETACHMENT SURGERY    ? ? ? Social History:  ? ?Social History  ? ?Tobacco Use  ? Smoking status: Former  ? Smokeless tobacco: Never  ?Substance Use Topics  ? Alcohol use: No  ?  ? ?Lives - At home ? ? Family History :  ? ?Family History  ?Problem Relation Age of Onset  ? Diabetes Mother   ? Cancer Father   ?     Prostate  ? Diabetes Father    ? Cancer Brother   ? Diabetes Brother   ? Breast cancer Maternal Aunt   ?     does not know age  ? ? ? Home Medications:  ? ?Prior to Admission medications   ?Medication Sig Start Date End Date Taking? Authorizing Provider  ?acetaminophen (TYLENOL) 500 MG tablet Take 1,000 mg by mouth every 6 (six) hours as needed for mild pain or moderate pain.     [provider]  ?amLODipine (NORVASC) 10 MG tablet Take 10 mg by mouth daily.    [provider]  ?aspirin EC 81 MG tablet Take 81 mg by mouth daily.    [provider]  ?atorvastatin (LIPITOR) 40 MG tablet Take 40 mg by mouth at bedtime.     [provider]  ?Cholecalciferol (VITAMIN D3) 1.25 MG (50000 UT) CAPS Take 50,000 capsules by mouth once a week. 04/19/21   [provider]  ?cycloSPORINE (RESTASIS) 0.05 % ophthalmic emulsion Place 1 drop into both eyes 2 (two) times daily.    [provider]  ?ferrous sulfate 325 (65 FE) MG tablet Take 325 mg by mouth daily with breakfast.    [provider]  ?folic acid (FOLVITE) 1 MG tablet Take 1 mg by mouth daily.    [provider]  ?glipiZIDE (GLUCOTROL XL) 5 MG 24 hr tablet Take 5 mg by mouth 3 (three) times a week. Mon, Wed, Fri    [provider]  ?insulin glargine (LANTUS) 100 UNIT/ML injection Inject 0.18 mLs (18 Units total) into the skin at bedtime. 09/02/17   Leana Gamer, MD  ?levothyroxine (SYNTHROID) 175 MCG tablet Take 175 mcg by mouth daily before breakfast.    [provider]  ?losartan (COZAAR) 100 MG tablet Take 0.5 tablets (50 mg total) by mouth daily. 05/15/21   Dorena Dew, FNP  ?LUMIGAN 0.01 % SOLN Place 1 drop into both eyes at bedtime. Separate by at least 10 minutes from other intraocular pressure reducing ophthalmic drugs 06/06/19   [provider]  ?meclizine (ANTIVERT) 25 MG tablet Take 25 mg by mouth 3 (three) times daily as needed for dizziness. 04/25/21   [provider]   ?metoprolol succinate (TOPROL-XL) 50 MG 24 hr tablet Take 50 mg by mouth at bedtime. Take with or immediately following a meal.    [provider]  ?Oxycodone HCl 10 MG TABS Take 0.5 tablets (5 mg total) by mouth every 6 (six) hours as needed. 05/15/21   Dorena Dew, FNP  ?RHOPRESSA 0.02 % SOLN Place 1 drop into the left eye at bedtime. 02/16/21   [provider]  ?vitamin C (ASCORBIC ACID) 500 MG tablet Take 500 mg by mouth daily.    [provider]  ? ? ? Allergies:  ? ?Allergies  ?Allergen Reactions  ? Bee Venom Swelling  ?  Nsaids Other (See Comments)  ?  Dyspepsia. Advised by Primary Provider to avoid because of Kidneys  ? ? ? Physical Exam:  ? ?Vitals:  ? ?Vitals:  ? 02/22/2022 1300 02/06/2022 1330  ?BP: (!) 149/55 (!) 169/66  ?Pulse: 71 63  ?Resp: 18   ?Temp:    ?SpO2: 95% 95%  ? ? ?Physical Exam: ?Constitutional: Patient appears well-developed and well-nourished. Not in obvious distress. ?HENT: Normocephalic, atraumatic, External right and left ear normal. Oropharynx is clear and moist.  ?Eyes: Conjunctivae and EOM are normal. PERRLA, no scleral icterus. ?Neck: Normal ROM. Neck supple. No JVD. No tracheal deviation. No thyromegaly. ?CVS: RRR, S1/S2 +, no murmurs, no gallops, no carotid bruit.  ?Pulmonary: Effort and breath sounds normal, no stridor, rhonchi, wheezes, rales.  ?Abdominal: Soft. BS +, no distension, tenderness, rebound or guarding.  ?Musculoskeletal: Normal range of motion. No edema and no tenderness.  ?Lymphadenopathy: No lymphadenopathy noted, cervical, inguinal or axillary ?Neuro: Alert. Normal reflexes, muscle tone coordination. No cranial nerve deficit. ?Skin: Skin is warm and dry. No rash noted. Not diaphoretic. No erythema. No pallor. ?Psychiatric: Normal mood and affect. Behavior, judgment, thought content normal. ? ? Data Review:  ? ?CBC ?Recent Labs  ?Lab 02/18/22 ?1441 02/21/2022 ?5374  ?WBC 12.9* 11.3*  ?HGB 11.7* 11.2*  ?HCT 32.7* 31.0*  ?PLT 238 205  ?MCV  83.6 84.7  ?MCH 29.9 30.6  ?MCHC 35.8 36.1*  ?RDW 15.9* 15.5  ?LYMPHSABS 1.6 1.7  ?MONOABS 0.8 1.0  ?EOSABS 0.0 0.1  ?BASOSABS 0.1 0.0  ? ?----------------------------------------------------------------------------------

## 2022-02-20 NOTE — ED Provider Notes (Signed)
?[name] ?Provider Note ? ? ?CSN: [id number] ?Arrival date & time: 02/15/2022  8921 ? ?  ? ?History ? ?Chief Complaint  ?Patient presents with  ? Sickle Cell Pain Crisis  ? ? ?[name] is a 76 y.o. female. ? ?Patient complains of pain in her right thigh and in her lower chest.  Patient states this is a sickle crisis.  This is happened before. ? ?The history is provided by the patient and medical records. No language interpreter was used.  ?Sickle Cell Pain Crisis ?Location:  Abdomen ?Severity:  Moderate ?Onset quality:  Sudden ?Similar to previous crisis episodes: yes   ?Timing:  Constant ?Progression:  Waxing and waning ?Chronicity:  New ?Sickle cell genotype:  SS ?History of pulmonary emboli: no   ?Context: not alcohol consumption   ?Relieved by:  Nothing ?Worsened by:  Nothing ?Associated symptoms: chest pain   ?Associated symptoms: no congestion, no cough, no fatigue and no headaches   ? ?  ? ?Home Medications ?Prior to Admission medications   ?Medication Sig Start Date End Date Taking? Authorizing Provider  ?acetaminophen (TYLENOL) 500 MG tablet Take 1,000 mg by mouth every 6 (six) hours as needed for mild pain or moderate pain.     [provider]  ?amLODipine (NORVASC) 10 MG tablet Take 10 mg by mouth daily.    [provider]  ?aspirin EC 81 MG tablet Take 81 mg by mouth daily.    [provider]  ?atorvastatin (LIPITOR) 40 MG tablet Take 40 mg by mouth at bedtime.     [provider]  ?Cholecalciferol (VITAMIN D3) 1.25 MG (50000 UT) CAPS Take 50,000 capsules by mouth once a week. 04/19/21   [provider]  ?cycloSPORINE (RESTASIS) 0.05 % ophthalmic emulsion Place 1 drop into both eyes 2 (two) times daily.    [provider]  ?ferrous sulfate 325 (65 FE) MG tablet Take 325 mg by mouth daily with breakfast.    [provider]  ?folic acid (FOLVITE) 1 MG tablet Take 1 mg by mouth daily.    [provider]  ?glipiZIDE (GLUCOTROL XL) 5 MG 24 hr tablet Take 5 mg by mouth 3 (three) times a week. Mon, Wed, Fri    [provider]  ?insulin glargine (LANTUS) 100 UNIT/ML injection Inject 0.18 mLs (18 Units total) into the skin at bedtime. 09/02/17   Leana Gamer, MD  ?levothyroxine (SYNTHROID) 175 MCG tablet Take 175 mcg by mouth daily before breakfast.    [provider]  ?losartan (COZAAR) 100 MG tablet Take 0.5 tablets (50 mg total) by mouth daily. 05/15/21   Dorena Dew, FNP  ?LUMIGAN 0.01 % SOLN Place 1 drop into both eyes at bedtime. Separate by at least 10 minutes from other intraocular pressure reducing ophthalmic drugs 06/06/19   [provider]  ?meclizine (ANTIVERT) 25 MG tablet Take 25 mg by mouth 3 (three) times daily as needed for dizziness. 04/25/21   [provider]  ?metoprolol succinate (TOPROL-XL) 50 MG 24 hr tablet Take 50 mg by mouth at bedtime. Take with or immediately following a meal.    [provider]  ?Oxycodone HCl 10 MG TABS Take 0.5 tablets (5 mg total) by mouth every 6 (six) hours as needed. 05/15/21   Dorena Dew, FNP  ?RHOPRESSA 0.02 % SOLN Place 1 drop into the left eye at bedtime. 02/16/21   [provider]  ?vitamin C (ASCORBIC ACID) 500 MG tablet  Take 500 mg by mouth daily.    [provider]  ?   ? ?Allergies    ?Bee venom and Nsaids   ? ?Review of Systems   ?Review of Systems  ?Constitutional:  Negative for appetite change and fatigue.  ?HENT:  Negative for congestion, ear discharge and sinus pressure.   ?Eyes:  Negative for discharge.  ?Respiratory:  Negative for cough.   ?Cardiovascular:  Positive for chest pain.  ?Gastrointestinal:  Positive for abdominal pain. Negative for diarrhea.  ?Genitourinary:  Negative for frequency and hematuria.  ?Musculoskeletal:  Negative for back pain.  ?     Right thigh pain  ?Skin:  Negative for rash.  ?Neurological:  Negative for seizures and headaches.   ?Psychiatric/Behavioral:  Negative for hallucinations.   ? ?Physical Exam ?Updated Vital Signs ?BP (!) 149/55   Pulse 71   Temp 98.1 ?F (36.7 ?C) (Oral)   Resp 18   SpO2 95%  ?Physical Exam ?Vitals and nursing note reviewed.  ?Constitutional:   ?   Appearance: She is well-developed.  ?HENT:  ?   Head: Normocephalic.  ?   Nose: Nose normal.  ?Eyes:  ?   General: No scleral icterus. ?   Conjunctiva/sclera: Conjunctivae normal.  ?Neck:  ?   Thyroid: No thyromegaly.  ?Cardiovascular:  ?   Rate and Rhythm: Normal rate and regular rhythm.  ?   Heart sounds: No murmur heard. ?  No friction rub. No gallop.  ?Pulmonary:  ?   Breath sounds: No stridor. No wheezing or rales.  ?Chest:  ?   Chest wall: No tenderness.  ?Abdominal:  ?   General: There is no distension.  ?   Tenderness: There is abdominal tenderness. There is no rebound.  ?   Comments: Tenderness at the xiphoid  ?Musculoskeletal:     ?   General: Normal range of motion.  ?   Cervical back: Neck supple.  ?Lymphadenopathy:  ?   Cervical: No cervical adenopathy.  ?Skin: ?   Findings: No erythema or rash.  ?Neurological:  ?   Mental Status: She is alert and oriented to person, place, and time.  ?   Motor: No abnormal muscle tone.  ?   Coordination: Coordination normal.  ?Psychiatric:     ?   Behavior: Behavior normal.  ? ? ?ED Results / Procedures / Treatments   ?Labs ?(all labs ordered are listed, but only abnormal results are displayed) ?Labs Reviewed  ?CBC WITH DIFFERENTIAL/PLATELET - Abnormal; Notable for the following components:  ?    Result Value  ? WBC 11.3 (*)   ? RBC 3.66 (*)   ? Hemoglobin 11.2 (*)   ? HCT 31.0 (*)   ? MCHC 36.1 (*)   ? nRBC 0.9 (*)   ? Neutro Abs 8.4 (*)   ? All other components within normal limits  ?COMPREHENSIVE METABOLIC PANEL - Abnormal; Notable for the following components:  ? Glucose, Bld 284 (*)   ? BUN 25 (*)   ? Creatinine, Ser 1.65 (*)   ? GFR, Estimated 33 (*)   ? All other components within normal limits  ?RETICULOCYTES -  Abnormal; Notable for the following components:  ? Retic Ct Pct 3.6 (*)   ? RBC. 3.70 (*)   ? Immature Retic Fract 29.7 (*)   ? All other components within normal limits  ?LIPASE, BLOOD - Abnormal; Notable for the following components:  ? Lipase 54 (*)   ? All other components within normal  limits  ?D-DIMER, QUANTITATIVE - Abnormal; Notable for the following components:  ? D-Dimer, Quant 1.59 (*)   ? All other components within normal limits  ?TROPONIN I (HIGH SENSITIVITY)  ?TROPONIN I (HIGH SENSITIVITY)  ? ? ?EKG ?EKG Interpretation ? ?Date/Time:  Thursday February 20 2022 08:31:01 EDT ?Ventricular Rate:  55 ?PR Interval:  182 ?QRS Duration: 99 ?QT Interval:  475 ?QTC Calculation: 455 ?R Axis:   73 ?Text Interpretation: Sinus rhythm Atrial premature complex Anteroseptal infarct, old Nonspecific T abnormalities, inferior leads Confirmed by Milton Ferguson [phone]) on 02/22/2022 8:41:28 AM ? ?Radiology ?DG Chest 2 View ? ?Result Date: 02/18/2022 ?CLINICAL DATA:  thoracic pain EXAM: CHEST - 2 VIEW COMPARISON:  May 11, 2021. FINDINGS: Mild streaky left basilar opacities. No confluent consolidation. No visible pleural effusions or pneumothorax. Cardiomediastinal silhouette is borderline enlarged, similar. No evidence of acute osseous abnormality. IMPRESSION: Mild streaky left basilar opacities, favor atelectasis/scar. Electronically Signed   By: Margaretha Sheffield M.D.   On: 02/18/2022 15:32  ? ?CT Angio Chest PE W and/or Wo Contrast ? ?Result Date: 02/16/2022 ?CLINICAL DATA:  Abdominal pain EXAM: CT ANGIOGRAPHY CHEST CT ABDOMEN AND PELVIS WITH CONTRAST TECHNIQUE: Multidetector CT imaging of the chest was performed using the standard protocol during bolus administration of intravenous contrast. Multiplanar CT image reconstructions and MIPs were obtained to evaluate the vascular anatomy. Multidetector CT imaging of the abdomen and pelvis was performed using the standard protocol during bolus administration of intravenous  contrast. RADIATION DOSE REDUCTION: This exam was performed according to the departmental dose-optimization program which includes automated exposure control, adjustment of the mA and/or kV according to patient size and/or u

## 2022-02-21 ENCOUNTER — Other Ambulatory Visit: Payer: Self-pay | Admitting: Family Medicine

## 2022-02-21 ENCOUNTER — Inpatient Hospital Stay (HOSPITAL_COMMUNITY): Payer: 59

## 2022-02-21 DIAGNOSIS — G894 Chronic pain syndrome: Secondary | ICD-10-CM

## 2022-02-21 DIAGNOSIS — E872 Acidosis, unspecified: Secondary | ICD-10-CM | POA: Diagnosis not present

## 2022-02-21 DIAGNOSIS — Z794 Long term (current) use of insulin: Secondary | ICD-10-CM | POA: Diagnosis not present

## 2022-02-21 DIAGNOSIS — R4020 Unspecified coma: Secondary | ICD-10-CM | POA: Diagnosis not present

## 2022-02-21 DIAGNOSIS — J9601 Acute respiratory failure with hypoxia: Secondary | ICD-10-CM | POA: Diagnosis not present

## 2022-02-21 DIAGNOSIS — E119 Type 2 diabetes mellitus without complications: Secondary | ICD-10-CM | POA: Diagnosis not present

## 2022-02-21 DIAGNOSIS — J690 Pneumonitis due to inhalation of food and vomit: Secondary | ICD-10-CM | POA: Diagnosis not present

## 2022-02-21 DIAGNOSIS — E1110 Type 2 diabetes mellitus with ketoacidosis without coma: Secondary | ICD-10-CM | POA: Diagnosis not present

## 2022-02-21 DIAGNOSIS — R401 Stupor: Secondary | ICD-10-CM | POA: Diagnosis not present

## 2022-02-21 DIAGNOSIS — D5700 Hb-SS disease with crisis, unspecified: Secondary | ICD-10-CM | POA: Diagnosis not present

## 2022-02-21 LAB — BLOOD GAS, ARTERIAL
Acid-base deficit: 5.1 mmol/L — ABNORMAL HIGH (ref 0.0–2.0)
Bicarbonate: 18.8 mmol/L — ABNORMAL LOW (ref 20.0–28.0)
O2 Saturation: 94.8 %
Patient temperature: 38.1
pCO2 arterial: 33 mmHg (ref 32–48)
pH, Arterial: 7.37 (ref 7.35–7.45)
pO2, Arterial: 69 mmHg — ABNORMAL LOW (ref 83–108)

## 2022-02-21 LAB — CBC
HCT: 27.1 % — ABNORMAL LOW (ref 36.0–46.0)
HCT: 28.7 % — ABNORMAL LOW (ref 36.0–46.0)
Hemoglobin: 9.7 g/dL — ABNORMAL LOW (ref 12.0–15.0)
Hemoglobin: 10.2 g/dL — ABNORMAL LOW (ref 12.0–15.0)
MCH: 30.5 pg (ref 26.0–34.0)
MCH: 29.8 pg (ref 26.0–34.0)
MCHC: 35.8 g/dL (ref 30.0–36.0)
MCHC: 35.5 g/dL (ref 30.0–36.0)
MCV: 85.2 fL (ref 80.0–100.0)
MCV: 83.9 fL (ref 80.0–100.0)
Platelets: 130 10*3/uL — ABNORMAL LOW (ref 150–400)
Platelets: 94 10*3/uL — ABNORMAL LOW (ref 150–400)
RBC: 3.18 MIL/uL — ABNORMAL LOW (ref 3.87–5.11)
RBC: 3.42 MIL/uL — ABNORMAL LOW (ref 3.87–5.11)
RDW: 16.1 % — ABNORMAL HIGH (ref 11.5–15.5)
RDW: 17 % — ABNORMAL HIGH (ref 11.5–15.5)
WBC: 19.1 10*3/uL — ABNORMAL HIGH (ref 4.0–10.5)
WBC: 25 10*3/uL — ABNORMAL HIGH (ref 4.0–10.5)
nRBC: 1.4 % — ABNORMAL HIGH (ref 0.0–0.2)
nRBC: 4.2 % — ABNORMAL HIGH (ref 0.0–0.2)

## 2022-02-21 LAB — BASIC METABOLIC PANEL
Anion gap: 7 (ref 5–15)
Anion gap: 12 (ref 5–15)
BUN: 26 mg/dL — ABNORMAL HIGH (ref 8–23)
BUN: 35 mg/dL — ABNORMAL HIGH (ref 8–23)
CO2: 23 mmol/L (ref 22–32)
CO2: 17 mmol/L — ABNORMAL LOW (ref 22–32)
Calcium: 9.3 mg/dL (ref 8.9–10.3)
Calcium: 9.4 mg/dL (ref 8.9–10.3)
Chloride: 108 mmol/L (ref 98–111)
Chloride: 107 mmol/L (ref 98–111)
Creatinine, Ser: 1.87 mg/dL — ABNORMAL HIGH (ref 0.44–1.00)
Creatinine, Ser: 2.19 mg/dL — ABNORMAL HIGH (ref 0.44–1.00)
GFR, Estimated: 28 mL/min — ABNORMAL LOW (ref >60)
GFR, Estimated: 23 mL/min — ABNORMAL LOW (ref >60)
Glucose, Bld: 255 mg/dL — ABNORMAL HIGH (ref 70–99)
Glucose, Bld: 206 mg/dL — ABNORMAL HIGH (ref 70–99)
Potassium: 4.5 mmol/L (ref 3.5–5.1)
Potassium: 4.8 mmol/L (ref 3.5–5.1)
Sodium: 138 mmol/L (ref 135–145)
Sodium: 136 mmol/L (ref 135–145)

## 2022-02-21 LAB — HEMOGLOBIN A1C
Hgb A1c MFr Bld: 5.3 % (ref 4.8–5.6)
Mean Plasma Glucose: 105 mg/dL

## 2022-02-21 LAB — GLUCOSE, CAPILLARY
Glucose-Capillary: 211 mg/dL — ABNORMAL HIGH (ref 70–99)
Glucose-Capillary: 133 mg/dL — ABNORMAL HIGH (ref 70–99)
Glucose-Capillary: 141 mg/dL — ABNORMAL HIGH (ref 70–99)
Glucose-Capillary: 196 mg/dL — ABNORMAL HIGH (ref 70–99)

## 2022-02-21 LAB — MRSA NEXT GEN BY PCR, NASAL: MRSA by PCR Next Gen: NOT DETECTED

## 2022-02-21 MED ORDER — OXYCODONE HCL 5 MG PO TABS: 5.0000 mg | ORAL_TABLET | ORAL | Status: DC | PRN

## 2022-02-21 MED ORDER — METRONIDAZOLE 500 MG/100ML IV SOLN
500.0000 mg | Freq: Two times a day (BID) | INTRAVENOUS | Status: DC
  Administered 2022-02-22 – 2022-02-23 (×4): 500 mg via INTRAVENOUS
  Filled 2022-02-21 (×4): qty 100

## 2022-02-21 MED ORDER — NALOXONE HCL 0.4 MG/ML IJ SOLN
INTRAMUSCULAR | Status: AC
  Administered 2022-02-21: 0.4 mg via INTRAVENOUS
  Filled 2022-02-21: qty 1

## 2022-02-21 MED ORDER — SODIUM CHLORIDE 0.9 % IV SOLN
1.0000 g | INTRAVENOUS | Status: DC
  Administered 2022-02-22 – 2022-02-23 (×2): 1 g via INTRAVENOUS
  Filled 2022-02-21 (×3): qty 10

## 2022-02-21 MED ORDER — CHLORHEXIDINE GLUCONATE CLOTH 2 % EX PADS
6.0000 | MEDICATED_PAD | Freq: Every day | CUTANEOUS | Status: DC
  Administered 2022-02-22: 6 via TOPICAL

## 2022-02-21 MED ORDER — HYDROMORPHONE HCL 1 MG/ML IJ SOLN: 0.5000 mg | INTRAMUSCULAR | Status: DC | PRN

## 2022-02-21 MED ORDER — NALOXONE HCL 0.4 MG/ML IJ SOLN
INTRAMUSCULAR | Status: AC
  Filled 2022-02-21: qty 1

## 2022-02-21 MED ORDER — NALOXONE HCL 0.4 MG/ML IJ SOLN: 0.4000 mg | INTRAMUSCULAR | Status: DC | PRN

## 2022-02-21 MED ORDER — NALOXONE HCL 4 MG/10ML IJ SOLN
2.0000 mg/h | INTRAVENOUS | Status: DC
  Administered 2022-02-21 – 2022-02-22 (×4): 2 mg/h via INTRAVENOUS
  Filled 2022-02-21 (×8): qty 10

## 2022-02-21 MED ORDER — ORAL CARE MOUTH RINSE
15.0000 mL | Freq: Two times a day (BID) | OROMUCOSAL | Status: DC
  Administered 2022-02-22: 15 mL via OROMUCOSAL

## 2022-02-21 MED ORDER — SODIUM CHLORIDE 0.45 % IV SOLN: INTRAVENOUS | Status: DC

## 2022-02-21 MED ORDER — NALOXONE HCL 2 MG/2ML IJ SOSY
2.0000 mg | PREFILLED_SYRINGE | INTRAMUSCULAR | Status: AC
  Administered 2022-02-21: 2 mg via INTRAVENOUS
  Filled 2022-02-21: qty 2

## 2022-02-21 MED ORDER — NALOXONE HCL 0.4 MG/ML IJ SOLN
INTRAMUSCULAR | Status: AC
  Administered 2022-02-21: 0.4 mg/mL
  Filled 2022-02-21: qty 1

## 2022-02-21 NOTE — Subjective & Objective (Signed)
Patient admitted 02/15/2022 for cycle cell crisis. She was started on diluadid PCA. She was otherwise stable. Per report from staff and patient's SO she has been somnolent since mid-day. Her PCA was stopped. She has had persistent obtundation despite Narcan x 3. Rapid response was called and night coverage notified. ?

## 2022-02-21 NOTE — Progress Notes (Signed)
Called to see patient by floor coverage. Patient admitted 02/18/2022 for SS crisis 02/19/2022 and started on dilaudid. She has been unresponsive since mid day. Rapid response was called 2000 hrs: patient did not respond to 3 doses of Narcan, remaining obtunded. Lab revealed leukocytosis this AM of 19.1, up from 11.5 at admission, AM lab revealed Cr 1.87 in patient with CKD 3, CXR revealed new R>L basilar Air space disease, ABG with Pco2 33, PO2 69, O2 sat 94.8. ? ?Chart reviewed. Meds reviewed. ? ?PE Vitals reviewed - stable with mildly elevated BP. ?General - obtunded older woman ?HEENT - nl ?CV - 2+ radial pulse, regular, precordium quiet ?Pul - mild increased work of breathing, no neck retractions, no abdominal musculature involved. Coarse rhonchi noted upper chest. Feint rales at basis L>R, no wheezing. ?Abd - soft ?Neuro - obtunded, minimal response to sternal rub. PERRLA, blinks to confrontation. No facial droop. Not flaccid. DTR 2+ at radial tendons, 1+ at knees, toes up going with plantar stimulation. ? ?See problem list for A&P ? ?M.Latacha Texeira, MD ?[phone] ?

## 2022-02-21 NOTE — Progress Notes (Incomplete)
Consultation Progress Note ? ? ?Patient: [name] IZX:[id number] DOB: 12/17/1945 DOA: 02/05/2022 ?DOS: the patient was seen and examined on 02/21/2022 ?Primary service: Tresa Garter, MD ? ?Brief hospital course: ?No notes on file ? ?Assessment and Plan: ?No notes have been filed under this hospital service. ?Service: Hospitalist ? ? ? ?{Tip this will not be part of the note when signed  DVT Prophylaxis  ., Scds  ?Enoxaparin (lovenox) injection 40 mg  (Optional):26781} ? ? ?{TRH_Follow_Sign_off:26779} ? ?Subjective: *** ? ?Physical Exam: ?Vitals:  ? 02/21/22 0924 02/21/22 1200 02/21/22 1328 02/21/22 1746  ?BP: (!) 145/70  (!) 154/63 (!) 174/115  ?Pulse: 86  93 (!) 106  ?Resp: 20 16 18 18   ?Temp: 99.4 ?F (37.4 ?C)  98.7 ?F (37.1 ?C) 99.1 ?F (37.3 ?C)  ?TempSrc: Oral  Oral Oral  ?SpO2: 95% 92% 91% 90%  ?Weight:      ?Height:      ? ?*** ?Data Reviewed: ?{Tip this will not be part of the note when signed- Document your independent interpretation of telemetry tracing, EKG, lab, Radiology test or any other diagnostic tests. Add any new diagnostic test ordered today. ?(Optional):26781} ?{Results:26384} ? ?Family Communication: *** ? ?Time spent: *** minutes. ? ?Author: ?Adella Hare, MD ?02/21/2022 9:07 PM ? ?For on call review [email or web address].  ?

## 2022-02-21 NOTE — Progress Notes (Signed)
ABG collected and send down to lab for analysis. Called lab.  ?

## 2022-02-21 NOTE — Progress Notes (Signed)
?  Transition of Care (TOC) Screening Note ? ? ?Patient Details  ?Name: [name] ?Date of Birth: Jul 10, 1946 ? ? ?Transition of Care (TOC) CM/SW Contact:    ?Melenie Minniear, Marjie Skiff, RN ?Phone Number: ?02/21/2022, 1:46 PM ? ? ? ?Transition of Care Department [facility]) has reviewed patient and no TOC needs have been identified at this time. We will continue to monitor patient advancement through interdisciplinary progression rounds. If new patient transition needs arise, please place a TOC consult. ?  ?

## 2022-02-21 NOTE — Assessment & Plan Note (Addendum)
Patient became less responsive at mid-day. Rapid response called approximately 20:00hr. PCA stoppped at mid day. Patient found to have food in her mouth at exam. Did not awaken with Narcan x 3. Neuro exam non-focal. ? ?Plan CT head w/o contrast ? Neuro consult-initial recommendation for MRI brain ? Hold all narcotics ?

## 2022-02-21 NOTE — Progress Notes (Signed)
Subjective: ?[name]  is a 76 y.o. female with a medical history significant for sickle cell disease, anemia of chronic disease, type 2 diabetes mellitus, chronic kidney disease stage IIIb, essential hypertension, and acquired hypothyroidism who was admitted for sickle cell pain crisis. ?Patient says that she is having pain mostly to low back and right lower extremity.  Patient seems very sleepy and is unable to rate pain. ? ?Objective: ? ?Vital signs in last 24 hours: ? ?Vitals:  ? 02/21/22 0924 02/21/22 1200 02/21/22 1328 02/21/22 1746  ?BP: (!) 145/70  (!) 154/63 (!) 174/115  ?Pulse: 86  93 (!) 106  ?Resp: 20 16 18 18   ?Temp: 99.4 ?F (37.4 ?C)  98.7 ?F (37.1 ?C) 99.1 ?F (37.3 ?C)  ?TempSrc: Oral  Oral Oral  ?SpO2: 95% 92% 91% 90%  ?Weight:      ?Height:      ? ? ?Intake/Output from previous day: ? ? ?Intake/Output Summary (Last 24 hours) at 02/21/2022 2110 ?Last data filed at 02/21/2022 1040 ?Gross per 24 hour  ?Intake 150 ml  ?Output --  ?Net 150 ml  ? ? ?Physical Exam: ?General: Alert, awake, oriented x3, in no acute distress.  Sleepy yet arousable. ?HEENT: [place]/AT PEERL, EOMI ?Neck: Trachea midline,  no masses, no thyromegal,y no JVD, no carotid bruit ?OROPHARYNX:  Moist, No exudate/ erythema/lesions.  ?Heart: Regular rate and rhythm, without murmurs, rubs, gallops, PMI non-displaced, no heaves or thrills on palpation.  ?Lungs: Clear to auscultation, no wheezing or rhonchi noted. No increased vocal fremitus resonant to percussion  ?Abdomen: Soft, nontender, nondistended, positive bowel sounds, no masses no hepatosplenomegaly noted.Marland Kitchen  ?Neuro: No focal neurological deficits noted cranial nerves II through XII grossly intact. DTRs 2+ bilaterally upper and lower extremities. Strength 5 out of 5 in bilateral upper and lower extremities. ?Musculoskeletal: No warm swelling or erythema around joints, no spinal tenderness noted. ?Psychiatric: Patient alert and oriented x3, good insight and cognition, good recent to  remote recall. ?Lymph node survey: No cervical axillary or inguinal lymphadenopathy noted. ? ?Lab Results: ? ?Basic Metabolic Panel: ?   ?Component Value Date/Time  ? NA 136 02/21/2022 2038  ? K 4.8 02/21/2022 2038  ? CL 107 02/21/2022 2038  ? CO2 17 (L) 02/21/2022 2038  ? BUN 35 (H) 02/21/2022 2038  ? CREATININE 2.19 (H) 02/21/2022 2038  ? GLUCOSE 206 (H) 02/21/2022 2038  ? CALCIUM 9.4 02/21/2022 2038  ? ?CBC: ?   ?Component Value Date/Time  ? WBC 19.1 (H) 02/21/2022 0507  ? HGB 9.7 (L) 02/21/2022 0507  ? HCT 27.1 (L) 02/21/2022 0507  ? PLT 130 (L) 02/21/2022 0507  ? MCV 85.2 02/21/2022 0507  ? NEUTROABS 8.4 (H) 02/13/2022 0754  ? LYMPHSABS 1.7 02/13/2022 0754  ? MONOABS 1.0 02/25/2022 0754  ? EOSABS 0.1 02/02/2022 0754  ? BASOSABS 0.0 02/03/2022 0754  ? ? ?No results found for this or any previous visit (from the past 240 hour(s)). ? ?Studies/Results: ?CT Angio Chest PE W and/or Wo Contrast ? ?Result Date: 01/30/2022 ?CLINICAL DATA:  Abdominal pain EXAM: CT ANGIOGRAPHY CHEST CT ABDOMEN AND PELVIS WITH CONTRAST TECHNIQUE: Multidetector CT imaging of the chest was performed using the standard protocol during bolus administration of intravenous contrast. Multiplanar CT image reconstructions and MIPs were obtained to evaluate the vascular anatomy. Multidetector CT imaging of the abdomen and pelvis was performed using the standard protocol during bolus administration of intravenous contrast. RADIATION DOSE REDUCTION: This exam was performed according to the departmental dose-optimization program which  includes automated exposure control, adjustment of the mA and/or kV according to patient size and/or use of iterative reconstruction technique. CONTRAST:  70mL OMNIPAQUE IOHEXOL 350 MG/ML SOLN COMPARISON:  CT chest dated June 24, 2016 FINDINGS: CTA CHEST FINDINGS Cardiovascular: Satisfactory opacification of the pulmonary arteries to the segmental level. No evidence of pulmonary embolism. Normal heart size. No pericardial  effusion. Atherosclerotic disease of the thoracic aorta. Left main and three-vessel coronary artery calcifications. Mediastinum/Nodes: Esophagus thyroid are unremarkable. No pathologically enlarged lymph nodes seen in the chest. Lungs/Pleura: Central airways are patent. Bibasilar atelectasis. No consolidation, pleural effusion or pneumothorax. Musculoskeletal: No chest wall abnormality. No acute or significant osseous findings. Review of the MIP images confirms the above findings. CT ABDOMEN and PELVIS FINDINGS Hepatobiliary: No focal liver abnormality is seen. Status post cholecystectomy. No biliary dilatation. Pancreas: Unremarkable. No pancreatic ductal dilatation or surrounding inflammatory changes. Spleen: Autoinfarcted spleen. Adrenals/Urinary Tract: Mild thickening of the left adrenal gland. Right adrenal gland is unremarkable. Kidneys enhance symmetrically and demonstrate a lobulated contour, likely due to chronic scarring. Punctate nonobstructing stone of the mid region of the left kidney. No hydronephrosis. Stomach/Bowel: Stomach is within normal limits. Appendix appears normal. Diverticulosis. No evidence of bowel wall thickening, distention, or inflammatory changes. Vascular/Lymphatic: Aortic atherosclerosis. No enlarged abdominal or pelvic lymph nodes. Reproductive: Uterus with calcified fibroids.  No adnexal mass. Other: No abdominal wall hernia or abnormality. No abdominopelvic ascites. 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 No aggressive appearing osseous lesions. Review of the MIP images confirms the above findings. IMPRESSION: 1. No evidence of pulmonary embolus. 2. No acute findings in the abdomen or pelvis. 3.  Aortic Atherosclerosis (ICD10-I70.0). Electronically Signed   By: Yetta Glassman M.D.   On: 02/16/2022 11:39  ? ?CT ABDOMEN PELVIS W CONTRAST ? ?Result Date: 01/31/2022 ?CLINICAL DATA:  Abdominal pain EXAM: CT ANGIOGRAPHY CHEST CT ABDOMEN AND  PELVIS WITH CONTRAST TECHNIQUE: Multidetector CT imaging of the chest was performed using the standard protocol during bolus administration of intravenous contrast. Multiplanar CT image reconstructions and MIPs were obtained to evaluate the vascular anatomy. Multidetector CT imaging of the abdomen and pelvis was performed using the standard protocol during bolus administration of intravenous contrast. RADIATION DOSE REDUCTION: This exam was performed according to the departmental dose-optimization program which includes automated exposure control, adjustment of the mA and/or kV according to patient size and/or use of iterative reconstruction technique. CONTRAST:  83mL OMNIPAQUE IOHEXOL 350 MG/ML SOLN COMPARISON:  CT chest dated June 24, 2016 FINDINGS: CTA CHEST FINDINGS Cardiovascular: Satisfactory opacification of the pulmonary arteries to the segmental level. No evidence of pulmonary embolism. Normal heart size. No pericardial effusion. Atherosclerotic disease of the thoracic aorta. Left main and three-vessel coronary artery calcifications. Mediastinum/Nodes: Esophagus thyroid are unremarkable. No pathologically enlarged lymph nodes seen in the chest. Lungs/Pleura: Central airways are patent. Bibasilar atelectasis. No consolidation, pleural effusion or pneumothorax. Musculoskeletal: No chest wall abnormality. No acute or significant osseous findings. Review of the MIP images confirms the above findings. CT ABDOMEN and PELVIS FINDINGS Hepatobiliary: No focal liver abnormality is seen. Status post cholecystectomy. No biliary dilatation. Pancreas: Unremarkable. No pancreatic ductal dilatation or surrounding inflammatory changes. Spleen: Autoinfarcted spleen. Adrenals/Urinary Tract: Mild thickening of the left adrenal gland. Right adrenal gland is unremarkable. Kidneys enhance symmetrically and demonstrate a lobulated contour, likely due to chronic scarring. Punctate nonobstructing stone of the mid region of the left  kidney. No hydronephrosis. Stomach/Bowel: Stomach is within normal limits. Appendix appears normal.  Diverticulosis. No evidence of bowel wall thickening, distention, or inflammatory changes. Vascular/Lymp

## 2022-02-21 NOTE — Consult Note (Signed)
? ?NAME:  [name], MRN:  [id number], DOB:  [birth year], LOS: 1 ?ADMISSION DATE:  02/25/2022, CONSULTATION DATE: February 21, 2022 ?REFERRING MD: Hospitalist, CHIEF COMPLAINT: AMS ? ?History of Present Illness:  ?Patient is 76 year old African-American female past medical history of sickle cell disease was admitted with bone crisis Pain crisis sickle cell pain crisis she was admitted to the hospital her pain was in her bone mostly in the chest and low back hips she was started on Dilaudid drip later on she became unresponsive she had a stat CT which was negative she received 3 doses of Narcan 0.4 mg with no response I gave her 2 mg and she responded very well opened eyes looked at her husband and I called his name she is moving upper lower extremities without limitations.  Patient was on Dilaudid PCA oxycodone OxyContin ? ?Objective   ?Blood pressure (!) 174/115, pulse (!) 106, temperature 99.1 ?F (37.3 ?C), temperature source Oral, resp. rate 18, height 5\' 3"  (1.6 m), weight 66.1 kg, SpO2 90 %. ?   ?FiO2 (%):  [0 %-21 %] 0 %  ? ?Intake/Output Summary (Last 24 hours) at 02/21/2022 2317 ?Last data filed at 02/21/2022 1040 ?Gross per 24 hour  ?Intake 150 ml  ?Output --  ?Net 150 ml  ? ?Filed Weights  ? 02/10/2022 1526 02/24/2022 1954  ?Weight: 66.1 kg 66.1 kg  ? ? ?Examination: ?General: Obtunded responded to Narcan able to protect airways ?Neuro: Pupils equal reactive to light moving upper lower extremities without limitations  ?HEENT:  atraumatic , no jaundice , dry mucous membranes  ?Cardiovascular:  Irregular irregular , ESM 2/6 in the aortic area  ?Lungs:  CTA bilateral , no wheezing or crackles  ?Abdomen:  Soft lax +BS , no tenderness . ?Musculoskeletal:  WNL , normal pulses  ?Skin:  No rash   ? ?Assessment & Plan:  ? ? ?--Obtundation from narcotics CT scan negative nonfocal patient stop narcotics and start Narcan assess if any further work-up is needed based on the response ? ? ?--Sickle crisis with leukocytosis  started on antibiotic hydrate very well. ?--Patient is on Lovenox for DVT prophylaxis she did risk of decompensation she is in the ICU please call us with question thank you for this consultation. ? ?Labs   ?CBC: ?Recent Labs  ?Lab 02/18/22 ?1441 02/19/2022 ?0754 02/21/22 ?0507 02/21/22 ?2038  ?WBC 12.9* 11.3* 19.1* 25.0*  ?NEUTROABS 10.4* 8.4*  --   --   ?HGB 11.7* 11.2* 9.7* 10.2*  ?HCT 32.7* 31.0* 27.1* 28.7*  ?MCV 83.6 84.7 85.2 83.9  ?PLT 238 205 130* 94*  ? ? ?Basic Metabolic Panel: ?Recent Labs  ?Lab 02/18/22 ?1441 02/18/2022 ?0754 02/21/22 ?0507 02/21/22 ?2038  ?NA 139 138 138 136  ?K 4.2 4.2 4.5 4.8  ?CL 106 108 108 107  ?CO2 23 22 23  17*  ?GLUCOSE 216* 284* 255* 206*  ?BUN 23 25* 26* 35*  ?CREATININE 1.35* 1.65* 1.87* 2.19*  ?CALCIUM 9.9 9.3 9.3 9.4  ? ?GFR: ?Estimated Creatinine Clearance: 20.9 mL/min (A) (by C-G formula based on SCr of 2.19 mg/dL (H)). ?Recent Labs  ?Lab 02/18/22 ?1441 02/15/2022 ?0754 02/21/22 ?0507 02/21/22 ?2038  ?WBC 12.9* 11.3* 19.1* 25.0*  ? ? ?Liver Function Tests: ?Recent Labs  ?Lab 02/05/2022 ?5009  ?AST 17  ?ALT 15  ?ALKPHOS 93  ?BILITOT 0.9  ?PROT 7.1  ?ALBUMIN 3.7  ? ?Recent Labs  ?Lab 02/03/2022 ?3818  ?LIPASE 54*  ? ?No results for input(s): AMMONIA in the last 168  hours. ? ?ABG ?   ?Component Value Date/Time  ? PHART 7.37 02/21/2022 2032  ? PCO2ART 33 02/21/2022 2032  ? PO2ART 69 (L) 02/21/2022 2032  ? HCO3 18.8 (L) 02/21/2022 2032  ? ACIDBASEDEF 5.1 (H) 02/21/2022 2032  ? O2SAT 94.8 02/21/2022 2032  ?  ? ?Coagulation Profile: ?No results for input(s): INR, PROTIME in the last 168 hours. ? ?Cardiac Enzymes: ?No results for input(s): CKTOTAL, CKMB, CKMBINDEX, TROPONINI in the last 168 hours. ? ?HbA1C: ?Hgb A1c MFr Bld  ?Date/Time Value Ref Range Status  ?02/22/2022 03:05 PM 5.3 4.8 - 5.6 % Final  ?  Comment:  ?  (NOTE) ?        Prediabetes: 5.7 - 6.4 ?        Diabetes: >6.4 ?        Glycemic control for adults with diabetes: <7.0 ?  ?06/12/2019 06:04 AM 4.4 (L) 4.8 - 5.6 % Final  ?   Comment:  ?  (NOTE) ?Pre diabetes:          5.7%-6.4% ?Diabetes:              >6.4% ?Glycemic control for   <7.0% ?adults with diabetes ?  ? ? ?CBG: ?Recent Labs  ?Lab 02/22/2022 ?2059 02/21/22 ?0734 02/21/22 ?1137 02/21/22 ?1626 02/21/22 ?2013  ?GLUCAP 204* 211* 133* 141* 196*  ? ? ?Review of Systems:   ?Unable to obtain due to patient condition ? ?Past Medical History:  ?She,  has a past medical history of Abdominal pain, Coronary artery disease, Diabetes mellitus without complication ([place]), Glaucoma, Hypertension, Hypothyroid, Renal insufficiency (09/01/2017), and Sickle cell anemia ([place]).  ? ?Surgical History:  ? ?Past Surgical History:  ?Procedure Laterality Date  ? CHOLECYSTECTOMY    ? HIP SURGERY    ? 6 yrs ago  ? REFRACTIVE SURGERY    ? RETINAL DETACHMENT SURGERY    ?  ? ?Social History:  ? reports that she has quit smoking. She has never used smokeless tobacco. She reports that she does not drink alcohol and does not use drugs.  ? ?Family History:  ?Her family history includes Breast cancer in her maternal aunt; Cancer in her brother and father; Diabetes in her brother, father, and mother.  ? ?Allergies ?Allergies  ?Allergen Reactions  ? Bee Venom Swelling  ? Nsaids Other (See Comments)  ?  Dyspepsia. Advised by Primary Provider to avoid because of Kidneys  ?  ? ?Home Medications  ?Prior to Admission medications   ?Medication Sig Start Date End Date Taking? Authorizing Provider  ?acetaminophen (TYLENOL) 500 MG tablet Take 1,000 mg by mouth every 6 (six) hours as needed for mild pain or moderate pain.    Yes [provider]  ?amLODipine (NORVASC) 10 MG tablet Take 10 mg by mouth daily.   Yes [provider]  ?aspirin EC 81 MG tablet Take 81 mg by mouth daily.   Yes [provider]  ?atorvastatin (LIPITOR) 40 MG tablet Take 40 mg by mouth at bedtime.    Yes [provider]  ?Cholecalciferol (VITAMIN D3) 1.25 MG (50000 UT) CAPS Take 50,000 capsules by mouth once a week. 04/19/21   Yes [provider]  ?ferrous sulfate 325 (65 FE) MG tablet Take 325 mg by mouth daily with breakfast.   Yes [provider]  ?folic acid (FOLVITE) 1 MG tablet Take 1 mg by mouth daily.   Yes [provider]  ?glipiZIDE (GLUCOTROL XL) 5 MG 24 hr tablet Take 5 mg by  mouth 3 (three) times a week. Mon, Wed, Fri   Yes [provider]  ?insulin glargine (LANTUS) 100 UNIT/ML injection Inject 0.18 mLs (18 Units total) into the skin at bedtime. 09/02/17  Yes Leana Gamer, MD  ?levothyroxine (SYNTHROID) 125 MCG tablet Take 125 mcg by mouth daily. 12/05/21  Yes [provider]  ?Lifitegrast (Shirley Friar) 5 % SOLN Place 1 drop into both eyes 2 (two) times daily.   Yes [provider]  ?losartan (COZAAR) 100 MG tablet Take 0.5 tablets (50 mg total) by mouth daily. ?Patient taking differently: Take 100 mg by mouth daily. 05/15/21  Yes Dorena Dew, FNP  ?LUMIGAN 0.01 % SOLN Place 1 drop into both eyes at bedtime. Separate by at least 10 minutes from other intraocular pressure reducing ophthalmic drugs 06/06/19  Yes [provider]  ?meclizine (ANTIVERT) 25 MG tablet Take 25 mg by mouth 3 (three) times daily as needed for dizziness. 04/25/21  Yes [provider]  ?metoprolol succinate (TOPROL-XL) 50 MG 24 hr tablet Take 50 mg by mouth at bedtime. Take with or immediately following a meal.   Yes [provider]  ?RHOPRESSA 0.02 % SOLN Place 1 drop into the left eye at bedtime. 02/16/21  Yes [provider]  ?vitamin C (ASCORBIC ACID) 500 MG tablet Take 500 mg by mouth daily.   Yes [provider]  ?cycloSPORINE (RESTASIS) 0.05 % ophthalmic emulsion Place 1 drop into both eyes 2 (two) times daily. ?Patient not taking: Reported on 02/28/2022    [provider]  ?levothyroxine (SYNTHROID) 175 MCG tablet Take 175 mcg by mouth See admin instructions. Taking daily except Sat & Wed ?Patient not taking: Reported on 02/05/2022     [provider]  ?Oxycodone HCl 10 MG TABS Take 0.5 tablets (5 mg total) by mouth every 6 (six) hours as needed. ?Patient not taking: Reported on 02/12/2022 05/15/21   Dorena Dew, FNP  ?  ?

## 2022-02-21 NOTE — Progress Notes (Signed)
Inpatient Diabetes Program Recommendations ? ?AACE/ADA: New Consensus Statement on Inpatient Glycemic Control (2015) ? ?Target Ranges:  Prepandial:   less than 140 mg/dL ?     Peak postprandial:   less than 180 mg/dL (1-2 hours) ?     Critically ill patients:  140 - 180 mg/dL  ? ?Lab Results  ?Component Value Date  ? GLUCAP 211 (H) 02/21/2022  ? HGBA1C 5.3 02/11/2022  ? ? ?Review of Glycemic Control ? Latest Reference Range & Units 02/04/2022 17:42 02/19/2022 20:59 02/21/22 07:34  ?Glucose-Capillary 70 - 99 mg/dL 253 (H) 204 (H) 211 (H)  ?(H): Data is abnormally high ? ?Diabetes history: DM2 ?Outpatient Diabetes medications: Lantus 18 units QHS, Glipizide 5 mg TID ?Current orders for Inpatient glycemic control: Novolog 0-9 units TID ? ?Inpatient Diabetes Program Recommendations:   ? ?Semglee 10 units QHS ? ?Will continue to follow while inpatient. ? ?Thank you, ?Reche Dixon, MSN, RN ?Diabetes Coordinator ?Inpatient Diabetes Program ?[phone] (team pager from 8a-5p) ? ? ? ?

## 2022-02-21 NOTE — Assessment & Plan Note (Addendum)
Patient with obtundation. Found to have food in mouth when rapid response arrived. Moderate tachypnea. On 4 liters PCO2 33, PO2 69, O2 sat 94. CXR with new basilar ASD R>L. Exam with coarse rhonchi, feint bibasilar wheeze. ? ?Plan Continue supplemental oxygen to keep Sat >90% ? Rocephin 1g q24 ? Flagyl 500 mg q8 ? F/u CBCD in AM ? F/u CXR in AM ? Move to step-down ?  ?

## 2022-02-22 ENCOUNTER — Inpatient Hospital Stay (HOSPITAL_COMMUNITY): Payer: 59

## 2022-02-22 DIAGNOSIS — J9601 Acute respiratory failure with hypoxia: Secondary | ICD-10-CM | POA: Diagnosis not present

## 2022-02-22 DIAGNOSIS — D5700 Hb-SS disease with crisis, unspecified: Secondary | ICD-10-CM | POA: Diagnosis not present

## 2022-02-22 DIAGNOSIS — R0603 Acute respiratory distress: Secondary | ICD-10-CM

## 2022-02-22 DIAGNOSIS — G894 Chronic pain syndrome: Secondary | ICD-10-CM | POA: Diagnosis not present

## 2022-02-22 DIAGNOSIS — R410 Disorientation, unspecified: Secondary | ICD-10-CM

## 2022-02-22 DIAGNOSIS — N184 Chronic kidney disease, stage 4 (severe): Secondary | ICD-10-CM

## 2022-02-22 DIAGNOSIS — M3119 Other thrombotic microangiopathy: Secondary | ICD-10-CM | POA: Diagnosis not present

## 2022-02-22 DIAGNOSIS — G9340 Encephalopathy, unspecified: Secondary | ICD-10-CM

## 2022-02-22 DIAGNOSIS — J690 Pneumonitis due to inhalation of food and vomit: Secondary | ICD-10-CM | POA: Diagnosis not present

## 2022-02-22 DIAGNOSIS — N179 Acute kidney failure, unspecified: Secondary | ICD-10-CM | POA: Diagnosis not present

## 2022-02-22 DIAGNOSIS — K922 Gastrointestinal hemorrhage, unspecified: Secondary | ICD-10-CM | POA: Diagnosis present

## 2022-02-22 LAB — CBC WITH DIFFERENTIAL/PLATELET
Abs Immature Granulocytes: 1.01 10*3/uL — ABNORMAL HIGH (ref 0.00–0.07)
Basophils Absolute: 0.1 10*3/uL (ref 0.0–0.1)
Basophils Relative: 0 %
Eosinophils Absolute: 0 10*3/uL (ref 0.0–0.5)
Eosinophils Relative: 0 %
HCT: 25.1 % — ABNORMAL LOW (ref 36.0–46.0)
Hemoglobin: 8.9 g/dL — ABNORMAL LOW (ref 12.0–15.0)
Immature Granulocytes: 5 %
Lymphocytes Relative: 12 %
Lymphs Abs: 2.3 10*3/uL (ref 0.7–4.0)
MCH: 30 pg (ref 26.0–34.0)
MCHC: 35.5 g/dL (ref 30.0–36.0)
MCV: 84.5 fL (ref 80.0–100.0)
Monocytes Absolute: 0.7 10*3/uL (ref 0.1–1.0)
Monocytes Relative: 4 %
Neutro Abs: 15.4 10*3/uL — ABNORMAL HIGH (ref 1.7–7.7)
Neutrophils Relative %: 79 %
Platelets: 64 10*3/uL — ABNORMAL LOW (ref 150–400)
RBC: 2.97 MIL/uL — ABNORMAL LOW (ref 3.87–5.11)
RDW: 17 % — ABNORMAL HIGH (ref 11.5–15.5)
WBC: 19.5 10*3/uL — ABNORMAL HIGH (ref 4.0–10.5)
nRBC: 1.2 % — ABNORMAL HIGH (ref 0.0–0.2)

## 2022-02-22 LAB — BASIC METABOLIC PANEL
Anion gap: 17 — ABNORMAL HIGH (ref 5–15)
Anion gap: 10 (ref 5–15)
Anion gap: 10 (ref 5–15)
BUN: 48 mg/dL — ABNORMAL HIGH (ref 8–23)
BUN: 47 mg/dL — ABNORMAL HIGH (ref 8–23)
BUN: 46 mg/dL — ABNORMAL HIGH (ref 8–23)
CO2: 11 mmol/L — ABNORMAL LOW (ref 22–32)
CO2: 23 mmol/L (ref 22–32)
CO2: 23 mmol/L (ref 22–32)
Calcium: 8.6 mg/dL — ABNORMAL LOW (ref 8.9–10.3)
Calcium: 8.4 mg/dL — ABNORMAL LOW (ref 8.9–10.3)
Calcium: 8.3 mg/dL — ABNORMAL LOW (ref 8.9–10.3)
Chloride: 103 mmol/L (ref 98–111)
Chloride: 106 mmol/L (ref 98–111)
Chloride: 107 mmol/L (ref 98–111)
Creatinine, Ser: 2.5 mg/dL — ABNORMAL HIGH (ref 0.44–1.00)
Creatinine, Ser: 1.98 mg/dL — ABNORMAL HIGH (ref 0.44–1.00)
Creatinine, Ser: 1.92 mg/dL — ABNORMAL HIGH (ref 0.44–1.00)
GFR, Estimated: 20 mL/min — ABNORMAL LOW (ref >60)
GFR, Estimated: 26 mL/min — ABNORMAL LOW (ref >60)
GFR, Estimated: 27 mL/min — ABNORMAL LOW (ref >60)
Glucose, Bld: 589 mg/dL (ref 70–99)
Glucose, Bld: 237 mg/dL — ABNORMAL HIGH (ref 70–99)
Glucose, Bld: 172 mg/dL — ABNORMAL HIGH (ref 70–99)
Potassium: 4.5 mmol/L (ref 3.5–5.1)
Potassium: 3.1 mmol/L — ABNORMAL LOW (ref 3.5–5.1)
Potassium: 3.7 mmol/L (ref 3.5–5.1)
Sodium: 131 mmol/L — ABNORMAL LOW (ref 135–145)
Sodium: 139 mmol/L (ref 135–145)
Sodium: 140 mmol/L (ref 135–145)

## 2022-02-22 LAB — DIC (DISSEMINATED INTRAVASCULAR COAGULATION)PANEL
D-Dimer, Quant: 12.04 ug/mL-FEU — ABNORMAL HIGH (ref 0.00–0.50)
Fibrinogen: 576 mg/dL — ABNORMAL HIGH (ref 210–475)
INR: 1.5 — ABNORMAL HIGH (ref 0.8–1.2)
Platelets: 64 10*3/uL — ABNORMAL LOW (ref 150–400)
Prothrombin Time: 17.9 seconds — ABNORMAL HIGH (ref 11.4–15.2)
aPTT: 41 seconds — ABNORMAL HIGH (ref 24–36)

## 2022-02-22 LAB — BLOOD GAS, ARTERIAL
Acid-base deficit: 13.5 mmol/L — ABNORMAL HIGH (ref 0.0–2.0)
Acid-base deficit: 6.7 mmol/L — ABNORMAL HIGH (ref 0.0–2.0)
Bicarbonate: 10.3 mmol/L — ABNORMAL LOW (ref 20.0–28.0)
Bicarbonate: 17.1 mmol/L — ABNORMAL LOW (ref 20.0–28.0)
FIO2: 100 %
MECHVT: 420 mL
O2 Saturation: 90.3 %
O2 Saturation: 100 %
PEEP: 5 cmH2O
Patient temperature: 36.2
Patient temperature: 35.4
RATE: 30 resp/min
pCO2 arterial: 19 mmHg — CL (ref 32–48)
pCO2 arterial: 25 mmHg — ABNORMAL LOW (ref 32–48)
pH, Arterial: 7.33 — ABNORMAL LOW (ref 7.35–7.45)
pH, Arterial: 7.43 (ref 7.35–7.45)
pO2, Arterial: 59 mmHg — ABNORMAL LOW (ref 83–108)
pO2, Arterial: 152 mmHg — ABNORMAL HIGH (ref 83–108)

## 2022-02-22 LAB — COMPREHENSIVE METABOLIC PANEL
ALT: 53 U/L — ABNORMAL HIGH (ref 0–44)
AST: 73 U/L — ABNORMAL HIGH (ref 15–41)
Albumin: 3.2 g/dL — ABNORMAL LOW (ref 3.5–5.0)
Alkaline Phosphatase: 395 U/L — ABNORMAL HIGH (ref 38–126)
Anion gap: 16 — ABNORMAL HIGH (ref 5–15)
BUN: 45 mg/dL — ABNORMAL HIGH (ref 8–23)
CO2: 12 mmol/L — ABNORMAL LOW (ref 22–32)
Calcium: 8.7 mg/dL — ABNORMAL LOW (ref 8.9–10.3)
Chloride: 103 mmol/L (ref 98–111)
Creatinine, Ser: 2.4 mg/dL — ABNORMAL HIGH (ref 0.44–1.00)
GFR, Estimated: 21 mL/min — ABNORMAL LOW (ref >60)
Glucose, Bld: 578 mg/dL (ref 70–99)
Potassium: 4.4 mmol/L (ref 3.5–5.1)
Sodium: 131 mmol/L — ABNORMAL LOW (ref 135–145)
Total Bilirubin: 1.5 mg/dL — ABNORMAL HIGH (ref 0.3–1.2)
Total Protein: 6.8 g/dL (ref 6.5–8.1)

## 2022-02-22 LAB — ECHOCARDIOGRAM COMPLETE
AR max vel: 2.07 cm2
AV Area VTI: 2.25 cm2
AV Area mean vel: 2.04 cm2
AV Mean grad: 3 mmHg
AV Peak grad: 5.7 mmHg
Ao pk vel: 1.19 m/s
Area-P 1/2: 8.92 cm2
Calc EF: 41.1 %
Height: 63 in
MV VTI: 2.22 cm2
S' Lateral: 2.9 cm
Single Plane A2C EF: 37.6 %
Single Plane A4C EF: 44.8 %
Weight: 2331.58 oz

## 2022-02-22 LAB — RAPID URINE DRUG SCREEN, HOSP PERFORMED
Amphetamines: NOT DETECTED
Barbiturates: NOT DETECTED
Benzodiazepines: NOT DETECTED
Cocaine: NOT DETECTED
Opiates: POSITIVE — AB
Tetrahydrocannabinol: NOT DETECTED

## 2022-02-22 LAB — CBC
HCT: 27.4 % — ABNORMAL LOW (ref 36.0–46.0)
Hemoglobin: 9.4 g/dL — ABNORMAL LOW (ref 12.0–15.0)
MCH: 30.6 pg (ref 26.0–34.0)
MCHC: 34.3 g/dL (ref 30.0–36.0)
MCV: 89.3 fL (ref 80.0–100.0)
Platelets: 95 10*3/uL — ABNORMAL LOW (ref 150–400)
RBC: 3.07 MIL/uL — ABNORMAL LOW (ref 3.87–5.11)
RDW: 18.2 % — ABNORMAL HIGH (ref 11.5–15.5)
WBC: 23.2 10*3/uL — ABNORMAL HIGH (ref 4.0–10.5)
nRBC: 0.9 % — ABNORMAL HIGH (ref 0.0–0.2)

## 2022-02-22 LAB — BETA-HYDROXYBUTYRIC ACID
Beta-Hydroxybutyric Acid: 0.71 mmol/L — ABNORMAL HIGH (ref 0.05–0.27)
Beta-Hydroxybutyric Acid: 0.12 mmol/L (ref 0.05–0.27)

## 2022-02-22 LAB — GLUCOSE, CAPILLARY
Glucose-Capillary: 426 mg/dL — ABNORMAL HIGH (ref 70–99)
Glucose-Capillary: 441 mg/dL — ABNORMAL HIGH (ref 70–99)
Glucose-Capillary: 387 mg/dL — ABNORMAL HIGH (ref 70–99)
Glucose-Capillary: 436 mg/dL — ABNORMAL HIGH (ref 70–99)
Glucose-Capillary: 348 mg/dL — ABNORMAL HIGH (ref 70–99)
Glucose-Capillary: 286 mg/dL — ABNORMAL HIGH (ref 70–99)
Glucose-Capillary: 214 mg/dL — ABNORMAL HIGH (ref 70–99)
Glucose-Capillary: 214 mg/dL — ABNORMAL HIGH (ref 70–99)
Glucose-Capillary: 206 mg/dL — ABNORMAL HIGH (ref 70–99)
Glucose-Capillary: 204 mg/dL — ABNORMAL HIGH (ref 70–99)
Glucose-Capillary: 208 mg/dL — ABNORMAL HIGH (ref 70–99)
Glucose-Capillary: 170 mg/dL — ABNORMAL HIGH (ref 70–99)
Glucose-Capillary: 156 mg/dL — ABNORMAL HIGH (ref 70–99)
Glucose-Capillary: 160 mg/dL — ABNORMAL HIGH (ref 70–99)

## 2022-02-22 LAB — URINALYSIS, MICROSCOPIC (REFLEX)

## 2022-02-22 LAB — URINALYSIS, ROUTINE W REFLEX MICROSCOPIC
Bilirubin Urine: NEGATIVE
Glucose, UA: >=500 mg/dL — AB
Ketones, ur: NEGATIVE mg/dL
Leukocytes,Ua: NEGATIVE
Nitrite: NEGATIVE
Protein, ur: 100 mg/dL — AB
Specific Gravity, Urine: 1.01 (ref 1.005–1.030)
pH: 6 (ref 5.0–8.0)

## 2022-02-22 LAB — RESPIRATORY PANEL BY PCR

## 2022-02-22 LAB — TROPONIN I (HIGH SENSITIVITY): Troponin I (High Sensitivity): 159 ng/L (ref <18)

## 2022-02-22 LAB — LACTIC ACID, PLASMA
Lactic Acid, Venous: >9 mmol/L (ref 0.5–1.9)
Lactic Acid, Venous: 5.7 mmol/L (ref 0.5–1.9)

## 2022-02-22 LAB — BRAIN NATRIURETIC PEPTIDE: B Natriuretic Peptide: 337 pg/mL — ABNORMAL HIGH (ref 0.0–100.0)

## 2022-02-22 LAB — OSMOLALITY: Osmolality: 321 mOsm/kg (ref 275–295)

## 2022-02-22 LAB — AMMONIA: Ammonia: 20 umol/L (ref 9–35)

## 2022-02-22 LAB — TSH: TSH: 0.363 u[IU]/mL (ref 0.350–4.500)

## 2022-02-22 LAB — STREP PNEUMONIAE URINARY ANTIGEN: Strep Pneumo Urinary Antigen: NEGATIVE

## 2022-02-22 LAB — PROCALCITONIN: Procalcitonin: 2.42 ng/mL

## 2022-02-22 LAB — GLUCOSE, RANDOM: Glucose, Bld: 491 mg/dL — ABNORMAL HIGH (ref 70–99)

## 2022-02-22 MED ORDER — SODIUM BICARBONATE 8.4 % IV SOLN
INTRAVENOUS | Status: AC
  Administered 2022-02-22: 50 meq
  Filled 2022-02-22: qty 150

## 2022-02-22 MED ORDER — PANTOPRAZOLE INFUSION (NEW) - SIMPLE MED
8.0000 mg/h | INTRAVENOUS | Status: DC
  Administered 2022-02-22 – 2022-02-23 (×3): 8 mg/h via INTRAVENOUS
  Filled 2022-02-22: qty 80
  Filled 2022-02-22: qty 100
  Filled 2022-02-22 (×2): qty 80

## 2022-02-22 MED ORDER — LACTATED RINGERS IV BOLUS
1000.0000 mL | Freq: Once | INTRAVENOUS | Status: AC
  Administered 2022-02-22: 1000 mL via INTRAVENOUS

## 2022-02-22 MED ORDER — METOPROLOL TARTRATE 5 MG/5ML IV SOLN
5.0000 mg | INTRAVENOUS | Status: AC | PRN
  Administered 2022-02-22 (×2): 5 mg via INTRAVENOUS
  Filled 2022-02-22 (×2): qty 5

## 2022-02-22 MED ORDER — PHENYLEPHRINE 40 MCG/ML (10ML) SYRINGE FOR IV PUSH (FOR BLOOD PRESSURE SUPPORT)
PREFILLED_SYRINGE | INTRAVENOUS | Status: AC
  Administered 2022-02-22: 200 ug via INTRAVENOUS
  Filled 2022-02-22: qty 10

## 2022-02-22 MED ORDER — FENTANYL CITRATE PF 50 MCG/ML IJ SOSY
25.0000 ug | PREFILLED_SYRINGE | INTRAMUSCULAR | Status: DC | PRN
  Administered 2022-02-22: 100 ug via INTRAVENOUS
  Filled 2022-02-22 (×3): qty 2

## 2022-02-22 MED ORDER — INSULIN REGULAR(HUMAN) IN NACL 100-0.9 UT/100ML-% IV SOLN
INTRAVENOUS | Status: DC
  Administered 2022-02-22: 8.5 [IU]/h via INTRAVENOUS

## 2022-02-22 MED ORDER — DOCUSATE SODIUM 50 MG/5ML PO LIQD
100.0000 mg | Freq: Two times a day (BID) | ORAL | Status: DC
  Administered 2022-02-22: 100 mg
  Filled 2022-02-22: qty 10

## 2022-02-22 MED ORDER — PHENYLEPHRINE 40 MCG/ML (10ML) SYRINGE FOR IV PUSH (FOR BLOOD PRESSURE SUPPORT)
PREFILLED_SYRINGE | INTRAVENOUS | Status: AC
  Administered 2022-02-22: 200 ug
  Filled 2022-02-22: qty 10

## 2022-02-22 MED ORDER — FENTANYL CITRATE (PF) 100 MCG/2ML IJ SOLN
INTRAMUSCULAR | Status: AC
  Administered 2022-02-22: 100 ug
  Filled 2022-02-22: qty 2

## 2022-02-22 MED ORDER — POTASSIUM CHLORIDE 20 MEQ PO PACK
40.0000 meq | PACK | Freq: Once | ORAL | Status: AC
  Administered 2022-02-22: 40 meq
  Filled 2022-02-22: qty 2

## 2022-02-22 MED ORDER — SODIUM BICARBONATE 8.4 % IV SOLN: 50.0000 meq | Freq: Once | INTRAVENOUS | Status: AC

## 2022-02-22 MED ORDER — DEXTROSE IN LACTATED RINGERS 5 % IV SOLN: INTRAVENOUS | Status: DC

## 2022-02-22 MED ORDER — INSULIN REGULAR(HUMAN) IN NACL 100-0.9 UT/100ML-% IV SOLN
INTRAVENOUS | Status: DC
  Filled 2022-02-22: qty 100

## 2022-02-22 MED ORDER — PHENYLEPHRINE 40 MCG/ML (10ML) SYRINGE FOR IV PUSH (FOR BLOOD PRESSURE SUPPORT): 200.0000 ug | PREFILLED_SYRINGE | Freq: Once | INTRAVENOUS | Status: AC | PRN

## 2022-02-22 MED ORDER — LACTATED RINGERS IV BOLUS
1000.0000 mL | INTRAVENOUS | Status: DC
  Administered 2022-02-22: 1000 mL via INTRAVENOUS

## 2022-02-22 MED ORDER — CHLORHEXIDINE GLUCONATE 0.12% ORAL RINSE (MEDLINE KIT)
15.0000 mL | Freq: Two times a day (BID) | OROMUCOSAL | Status: DC
  Administered 2022-02-22 – 2022-02-23 (×3): 15 mL via OROMUCOSAL

## 2022-02-22 MED ORDER — ROCURONIUM BROMIDE 10 MG/ML (PF) SYRINGE
PREFILLED_SYRINGE | INTRAVENOUS | Status: AC
  Administered 2022-02-22: 100 mg
  Filled 2022-02-22: qty 10

## 2022-02-22 MED ORDER — FENTANYL CITRATE PF 50 MCG/ML IJ SOSY: 25.0000 ug | PREFILLED_SYRINGE | INTRAMUSCULAR | Status: DC | PRN

## 2022-02-22 MED ORDER — POLYETHYLENE GLYCOL 3350 17 G PO PACK: 17.0000 g | PACK | Freq: Every day | ORAL | Status: DC

## 2022-02-22 MED ORDER — INSULIN REGULAR(HUMAN) IN NACL 100-0.9 UT/100ML-% IV SOLN
INTRAVENOUS | Status: DC
  Administered 2022-02-22: 2.8 [IU]/h via INTRAVENOUS
  Filled 2022-02-22: qty 100

## 2022-02-22 MED ORDER — MIDAZOLAM HCL 2 MG/2ML IJ SOLN
2.0000 mg | INTRAMUSCULAR | Status: DC | PRN
  Administered 2022-02-22: 2 mg via INTRAVENOUS
  Filled 2022-02-22: qty 2

## 2022-02-22 MED ORDER — PANTOPRAZOLE 80MG IVPB - SIMPLE MED
80.0000 mg | Freq: Once | INTRAVENOUS | Status: AC
  Administered 2022-02-22: 80 mg via INTRAVENOUS
  Filled 2022-02-22: qty 80

## 2022-02-22 MED ORDER — DEXTROSE 50 % IV SOLN: 0.0000 mL | INTRAVENOUS | Status: DC | PRN

## 2022-02-22 MED ORDER — SODIUM BICARBONATE 8.4 % IV SOLN
INTRAVENOUS | Status: AC
  Administered 2022-02-22: 50 meq via INTRAVENOUS
  Filled 2022-02-22: qty 50

## 2022-02-22 MED ORDER — LACTATED RINGERS IV SOLN: INTRAVENOUS | Status: DC

## 2022-02-22 MED ORDER — POTASSIUM CHLORIDE 10 MEQ/100ML IV SOLN: 10.0000 meq | INTRAVENOUS | Status: DC

## 2022-02-22 MED ORDER — PANTOPRAZOLE SODIUM 40 MG IV SOLR
40.0000 mg | Freq: Two times a day (BID) | INTRAVENOUS | Status: DC
Start: 2022-02-25 — End: 2022-02-24

## 2022-02-22 MED ORDER — INSULIN ASPART 100 UNIT/ML IJ SOLN
0.0000 [IU] | INTRAMUSCULAR | Status: DC
  Administered 2022-02-22: 9 [IU] via SUBCUTANEOUS

## 2022-02-22 MED ORDER — ETOMIDATE 2 MG/ML IV SOLN
INTRAVENOUS | Status: AC
  Administered 2022-02-22: 20 mg
  Filled 2022-02-22: qty 20

## 2022-02-22 MED ORDER — ROCURONIUM BROMIDE 10 MG/ML (PF) SYRINGE
PREFILLED_SYRINGE | INTRAVENOUS | Status: AC
  Administered 2022-02-22: 50 mg
  Filled 2022-02-22: qty 10

## 2022-02-22 MED ORDER — HEPARIN SODIUM (PORCINE) 5000 UNIT/ML IJ SOLN: 5000.0000 [IU] | Freq: Three times a day (TID) | INTRAMUSCULAR | Status: DC

## 2022-02-22 MED ORDER — MIDAZOLAM HCL 2 MG/2ML IJ SOLN
INTRAMUSCULAR | Status: AC
  Administered 2022-02-22: 2 mg via INTRAVENOUS
  Filled 2022-02-22: qty 2

## 2022-02-22 MED ORDER — MIDAZOLAM HCL 2 MG/2ML IJ SOLN
2.0000 mg | INTRAMUSCULAR | Status: DC | PRN
  Administered 2022-02-23: 2 mg via INTRAVENOUS
  Filled 2022-02-22: qty 2

## 2022-02-22 MED ORDER — NOREPINEPHRINE 4 MG/250ML-% IV SOLN: 0.0000 ug/min | INTRAVENOUS | Status: DC

## 2022-02-22 MED ORDER — ORAL CARE MOUTH RINSE
15.0000 mL | OROMUCOSAL | Status: DC
  Administered 2022-02-22 – 2022-02-23 (×14): 15 mL via OROMUCOSAL

## 2022-02-22 MED ORDER — PROPOFOL 1000 MG/100ML IV EMUL
0.0000 ug/kg/min | INTRAVENOUS | Status: DC
  Administered 2022-02-22 – 2022-02-23 (×2): 5 ug/kg/min via INTRAVENOUS
  Filled 2022-02-22: qty 200

## 2022-02-22 MED ORDER — PANTOPRAZOLE SODIUM 40 MG IV SOLR
40.0000 mg | INTRAVENOUS | Status: DC
Start: 2022-02-22 — End: 2022-02-22

## 2022-02-22 NOTE — Procedures (Signed)
Central Venous Catheter Insertion Procedure Note ? ?[name]  ?[id number]  ?01-28-1946 ? ?Date:02/22/22  ?Time:12:17 PM  ? ?Provider Performing:Natalyia Innes  ? ?Procedure: Insertion of Non-tunneled Central Venous Catheter(36556) with US guidance (44920)  ? ?Indication(s) ?Medication administration and Difficult access ? ?Consent ?Risks of the procedure as well as the alternatives and risks of each were explained to the patient and/or caregiver.  Consent for the procedure was obtained and is signed in the bedside chart ? ?Anesthesia ?Sedation on vent with roc x 1 x 50mg  ? ?Timeout ?Verified patient identification, verified procedure, site/side was marked, verified correct patient position, special equipment/implants available, medications/allergies/relevant history reviewed, required imaging and test results available. ? ?Sterile Technique ?Maximal sterile technique including full sterile barrier drape, hand hygiene, sterile gown, sterile gloves, mask, hair covering, sterile ultrasound probe cover (if used). ? ?Procedure Description ?Area of catheter insertion was cleaned with chlorhexidine and draped in sterile fashion.  With real-time ultrasound guidance a central venous catheter was placed into the right internal jugular vein. Nonpulsatile blood flow and easy flushing noted in all ports.  The catheter was sutured in place and sterile dressing applied. ? ?Complications/Tolerance ?None; patient tolerated the procedure well. ?Chest X-ray is ordered to verify placement for internal jugular or subclavian cannulation.   Chest x-ray is not ordered for femoral cannulation. ? ?EBL ?Minimal ? ?Specimen(s) ?None ? ? ? ?SIGNATURE  ? ? ?Dr. Brand Males, M.D., F.C.C.P,  ?Pulmonary and Critical Care Medicine ?Staff Physician, [place] ?Center Director - Interstitial Lung Disease  Program  ?[facility] [place] [facility] ?[place], [place], [address] ? ?NPI Number:  NPI  #[id number] ?DEA Number: [id number] ? ?Pager: [phone], If no answer  -> Check AMION or Try [phone] ?Telephone (clinical office): [phone] ?Telephone (research): [phone] ? ?12:18 PM ?02/22/2022 ? ? ? ?

## 2022-02-22 NOTE — Plan of Care (Signed)
?  Problem: Education: ?Goal: Knowledge of General Education information will improve ?Description: Including pain rating scale, medication(s)/side effects and non-pharmacologic comfort measures ?Outcome: Not Progressing ?  ?Problem: Health Behavior/Discharge Planning: ?Goal: Ability to manage health-related needs will improve ?Outcome: Not Progressing ?  ?Problem: Nutrition: ?Goal: Adequate nutrition will be maintained ?Outcome: Not Progressing ?  ?Problem: Pain Managment: ?Goal: General experience of comfort will improve ?Outcome: Not Progressing ?  ?

## 2022-02-22 NOTE — Consult Note (Addendum)
? ?NAME:  [name], MRN:  [id number], DOB:  06-Nov-1946, LOS: 2 ?ADMISSION DATE:  02/21/2022, CONSULTATION DATE: February 21, 2022 ?REFERRING MD: Hospitalist, CHIEF COMPLAINT: AMS ? ?BRIEF  ?Patient is 76 year old African-American female past medical history of sickle cell disease was admitted with bone crisis Pain crisis sickle cell pain crisis she was admitted to the hospital her pain was in her bone mostly in the chest and low back hips she was started on Dilaudid drip later on she became unresponsive she had a stat CT which was negative she received 3 doses of Narcan 0.4 mg with no response I gave her 2 mg and she responded very well opened eyes looked at her husband and I called his name she is moving upper lower extremities without limitations.  Patient was on Dilaudid PCA oxycodone OxyContin ? ? ?PAST   ? ? has a past medical history of Abdominal pain, Coronary artery disease, Diabetes mellitus without complication ([place]), Glaucoma, Hypertension, Hypothyroid, Renal insufficiency (09/01/2017), and Sickle cell anemia ([place]). ? ? has a past surgical history that includes Refractive surgery; Hip surgery; Retinal detachment surgery; and Cholecystectomy. ? ? ?EVENTS  ? ?02/19/2022 - admit wth left shoulder and left arm pain c/w Sickle cell crisi. STarted dilaudid PCA . Alert and oriente din ER ?02/21/22 - DM nurse added semglee 10 units QHS. Found to be sleepy afternoon rounds. Dilaudid PCA stopped. Noted on 2L [place]. Diet continued. Creat increased to 1.87mg %. -> later approx 8pm: needing 4 LNC and obtunded ->  ccm consult -> tx to sdu/ICU -> narcan gtt. [place] full of food ? ? ?SUBJECTIVE/OVERNIGHT/INTERVAL HX  ? ?02/22/2022 - still obutnded despite narcan gtt. Not on pressors. Not on vent. Sugars > 500. BO high 156-212sbp. 5 L [place]. Tachyepnic. Ammonia normal at 20 . Per RN - last night during rapid response there was lot of food in mouth. Concern for aspiration ? ? ?Objective   ?Blood pressure (!) 156/69, pulse 98,  temperature 98.2 ?F (36.8 ?C), temperature source Axillary, resp. rate (!) 28, height 5\' 3"  (1.6 m), weight 66.1 kg, SpO2 96 %. ?   ?FiO2 (%):  [0 %] 0 %  ? ?Intake/Output Summary (Last 24 hours) at 02/22/2022 0730 ?Last data filed at 02/22/2022 0630 ?Gross per 24 hour  ?Intake 1173.59 ml  ?Output --  ?Net 1173.59 ml  ? ?Filed Weights  ? 02/27/2022 1526 02/02/2022 1954  ?Weight: 66.1 kg 66.1 kg  ? ?General Appearance:  Looks criticall ill  ?Head:  Normocephalic, without obvious abnormality, atraumatic ?Eyes:  PERRL - yes, conjunctiva/corneas - muddy     ?Ears:  Normal external ear canals, both ears ?Nose:  G tube - no but is on  5L C ?Throat:  ETT TUBE - no , OG tube - no ?Neck:  Supple,  No enlargement/tenderness/nodules ?Lungs: Clear to auscultation bilaterally, RR 30, Paradoxical respiration. Acc Muscle use + ?Heart:  S1 and S2 normal, no murmur, CVP - no.  Pressors - no.  ?Abdomen:  Soft, no masses, no organomegaly ?Genitalia / Rectal:  Not done ?Extremities:  Extremities- intact ?Skin:  ntact in exposed areas . Sacral area - not exxamined ?Neurologic:  Sedation - Narcan gtt -> RASS - -3/-4 . Moves all 4s - no. CAM-ICU - cannot assess . Orientation - not oriented. Obtudned + ? ? ? ? ?Assessment & Plan:  ? ? ?PULMONARY  ?A:  ?Acute hypoxemic respiratory failure -with interval infiltrates onset 02/21/2022 -due to sickle cell and subsequently aspiration ? ?02/22/2022 ->  progressive worsening 5 L nasal cannula associated with concern for aspiration on 02/21/2022.  Differential diagnosis includes viral infections and also pulmonary edema tachypneic paradoxical and obtunded ? ?P:   ?Intubate ?PRVC ?VAP bundle with oral care ? ? 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   Sepsis), DKA ?  ?02/22/2022: Not responding to Narcan infusion.  Worsening hyperglycemia ? ? ?P:   ?Postintubation sedation with propofol ?RASS sedation score 0--2 [currently -4] ?Probably will stop Narcan ?Fentanyl as needed ?Versed as needed ? ?Continue eye drops for glaucoma ? ? ?VASCULAR ?A:   ?Hx of hypertension - Present on Admit  - on norvasc, aspirin, losartan,lopressor at home ?Hx of Hypperlipidemia - Prior to & Present on Admit - on lipitor ? ?02/22/2022: Severe hypertension despite 3 oral agents which we are unable to administer ? ? ?P:  ?Monitor blood pressure post intubation and propofol ?Allow for 25% reduction compared to baseline ? ?CARDIAC STRUCTURAL ?A: ?No history of prior echocardiogram ? ?P: ?Check troponin ?Check echocardiogram ? ?CARDIAC ELECTRICAL ?A: ?Sinus tachycardia ? ? ?P: ?Monitor ? ?INFECTIOUS ?A:   ?High concern for aspiration 02/21/2022 [seen with food in the mouth] ? ?P:   ?Check procalcitonin ?Check blood culture ?Check urine strep and Legionella ?Check respiratory virus panel ?Continue ceftriaxone ?Continue Flagyl ? ?RENAL ?A:  ?CKD stage 3 B - Baseline creat approx 1.3 - 1.5mg % Prior to & Present on Admit ?AKI present on admission:.  And 1.60 mg percent ? ?02/22/2022: Progressive worsening creatinine 2.4 mg percent ? ?P:  ?Foley catheter ?Fluids ?No NSAIDs ? ?METABOLIC ? ?1/94/1740 - metabolic acidosis ? ?Plan ?- fluid ? = bic pre intubation ?- ICU hyperglucemiua protocol ? ? ?ELECTROLYTES ?A:  ?Mild hyponatremia present 02/22/2022 ? ?P: ?Monitor ? ? ?GASTROINTESTINAL ?A:   ?Concern for aspiration and high risk due to encephalopathy ? ?P:   ?N.p.o. ?PPI ? ?HEMATOLOGIC   - HEME ?A:  ?Sickle cell disease with a baseline hemoglobin 11 g% in January 2023 and 8.5 g% in June 2022 ? -Admission hemoglobin of 9.7 g% ? ?02/22/2022: No active bleeding ? ?P:  ?- PRBC for hgb </= 6.9gm%   ? - exceptions are ?  -  if ACS susepcted/confirmed then transfuse for hgb </= 8.0gm%,  or  ?  -  active  bleeding with hemodynamic instability, then transfuse regardless of hemoglobin value ?  At at all times try to transfuse 1 unit prbc as possible with exception of active hemorrhage ? ? ?HEMATOLOGIC - Platelets ?A ?Normal platelets baseline.  ?New onset thrombocytopenia 02/21/2022 (platelet 130k] ? ?02/22/2022: Worsening thrombocytopenia platelet 95,000 ? ?P ?Check duplex lower extremity ?Check DIC panel ?Check HIT antibody panel 3/26 ?Will do heparin dvt proph for now but stop if platelt drops futher ? ?ENDOCRINE ?A:   ?T2DM  - Prior to & Present on Admit - on insulin, glucotrol ?Hx of hypothyroid - Prior to & Present on Admit - on  ?Hx of Vit D deficiiency ? ?02/22/2022 - ? In DKA/HONK versus severe hyperglycemia ?   ?P:   ?ICU Hypergluycemia prtool ?Check BHOB ? ? ? ? ? ?Best practice:  ?Diet: N.p.o. ?Pain/Anxiety/Delirium protocol (if indicated): Stop Narcan before fall ?VAP protocol (if indicated): Yes ?DVT prophylaxis: Heparin ?GI prophylaxis: PPI ?Glucose  control: DKA protocol ?Mobility: Bedrest ?Code Status: Full code ?Family Communication: husband updated of all of the above ?Disposition: ICU to [facility] long ? ? ? ? ?[place]  ? ?The patient Abrianna Sidman is critically ill with multiple organ systems failure and requires high complexity decision making for assessment and support, frequent evaluation and titration of therapies, application of advanced monitoring technologies and extensive interpretation of multiple databases.  ? ?Critical Care Time devoted to patient care services described in this note is  75  Minutes. This time reflects time of care of this signee Dr Brand Males. This critical care time does not reflect procedure time, or teaching time or supervisory time of PA/NP/Med student/Med Resident etc but could involve care discussion time  ? ? ? ?Dr. Brand Males, M.D., F.C.C.P ?Pulmonary and Critical Care Medicine ?Staff Physician ?[facility] ?Pager: [phone], If no answer or between  15:00h - 7:00h: call [phone] ? ?02/22/2022 ?7:32 AM ? ? ?LABS  ? ? Latest Reference Range & Units 02/22/22 06:30  ?Ammonia 9 - 35 umol/L 20  ?

## 2022-02-22 NOTE — Progress Notes (Addendum)
eLink Physician-Brief Progress Note ?Patient Name: [name] ?DOB: [birth year] ?MRN: [id number] ? ? ?Date of Service ? 02/22/2022  ?HPI/Events of Note ? Pt blood sugar is 426, B/P 195/78, ST 120's. Pt is moaning, on a Narcan gtt 0.4 ? ?Had a PCA pump. Was DC ed due to encephalopathy. On narcan gtt. ?  ?eICU Interventions ? -discussed with bed side RN. ?All along CBG < 230.  ?Repeat CBG again. ?Changed ssi to q4 hrs. NPO .   ? ? ? ?Intervention Category ?Intermediate Interventions: Pain - evaluation and management;Hypertension - evaluation and management ? ?Elmer Sow ?02/22/2022, 1:54 AM ? ?2:33 AM ?Camera eval for hypertension. 197/115, HR 115. On narcan gtt. Still lethargic. Sickle cell ?- unable to take oral metoprolol ? ?Discussed with bed side RN. ?- lopressor ordered IV ? ?4:46 ?CBG and blood draw > 400. ?Get a CMP stat for any AG widening or acidosis for insuline gtt, on half NS fluids only. ? ?Discussed with RN ? ?6 PM ?Hard stick so CMP will be delayed. To go ahead and cover with Insuline ssi -9 units sq now. Watch for hypoglycemia.  ?NH3 also ordered for encephalopathy. ? ? ? ?

## 2022-02-22 NOTE — Progress Notes (Signed)
Inpatient Diabetes Program Recommendations ? ?AACE/ADA: New Consensus Statement on Inpatient Glycemic Control (2015) ? ?Target Ranges:  Prepandial:   less than 140 mg/dL ?     Peak postprandial:   less than 180 mg/dL (1-2 hours) ?     Critically ill patients:  140 - 180 mg/dL  ? ?Lab Results  ?Component Value Date  ? GLUCAP 441 (H) 02/22/2022  ? HGBA1C 5.3 02/06/2022  ? ? ?Review of Glycemic Control ? Latest Reference Range & Units 02/22/22 07:58  ?CO2 22 - 32 mmol/L 11 (L)  ?Glucose 70 - 99 mg/dL 589 (HH)  ?BUN 8 - 23 mg/dL 48 (H)  ?Creatinine 0.44 - 1.00 mg/dL 2.50 (H)  ?Calcium 8.9 - 10.3 mg/dL 8.6 (L)  ?Anion gap 5 - 15  17 (H)  ? ?Diabetes history: DM2 ?Outpatient Diabetes medications: Lantus 18 units QHS, Glipizide 5 mg TID ?Current orders for Inpatient glycemic control: IV insulin ?  ?Inpatient Diabetes Program Recommendations:   ?  ?Noted severe hyperglycemia and updated labs from this AM: CO2 11, anion gap 17. Patient has been without basal insulin since admission.  ?Plan for Endotool; Would change order to reflect DKA for mode of therapy in order to have associated lab orders for BMET Q4H.  ?Following.  ? ?Thanks, ?Bronson Curb, MSN, RNC-OB ?Diabetes Coordinator ?[phone] (8a-5p) ? ? ? ?

## 2022-02-22 NOTE — Procedures (Addendum)
Intubation Procedure Note ? ?[name]  ?[id number]  ?08/12/1946 ? ?Date:02/22/22  ?Time:8:58 AM  ? ?Provider Performing:Kyro Joswick  ? ? ?Procedure: Intubation (53010) ? ?Indication(s) ?Respiratory Failure ? ?Consent ?Risks of the procedure as well as the alternatives and risks of each were explained to the  caregiver.  Consent for the procedure was obtained verbally from husband due to emergency nature ? ?Anesthesia ?Etomidate, Versed, Fentanyl, Rocuronium, and bic amp pre and post procedure ? ? ?Time Out ?Verified patient identification, verified procedure, site/side was marked, verified correct patient position, special equipment/implants available, medications/allergies/relevant history reviewed, required imaging and test results available. ? ? ?Sterile Technique ?Usual hand hygeine, masks, and gloves were used ? ? ?Procedure Description ?Patient positioned in bed supine.  Sedation given as noted above.  Patient was intubated with endotracheal tube using Glidescope.  View was Grade 1 full glottis .  Number of attempts was 1.  Colorimetric CO2 detector was consistent with tracheal placement. ? ? ?Complications/Tolerance ?Pre intubation suddent attempt at vomit - black and coffee ground . Placed OG tube post intubation wth glidescope ? ?Chest X-ray is ordered to verify placement. ? ?Also hypotension despite fluids. Was very hypertensive pre intubation - given neo stick ? ?Start PPI gtt. Stop heparin/Lovenix ? ?EBL ?none ? ? ?Specimen(s) ?None ? ?

## 2022-02-22 NOTE — Progress Notes (Signed)
PCCM afternoon evaluation ? ?FiO2 60% on the ventilator.  Still unresponsive.  MRI pending.  Has been seen by neurologist.  Antibiotics ongoing.  Echo shows and left ventricular systolic heart failure EF 45%.  But she is requiring fluids because of the acidosis and renal failure and lactic acidosis.  Lactic acidosis that is improving.  She is not on pressors.  Naloxone has been stopped.  Her bicarb is normal.  Anion gap is closed.  Overall trend is positive ? ?Plan ?- Try to get MRI brain when possible ?- Once renal function improved and -> then consider diuresis ?-Reduce fluid volume from 125 cc/h to 75 cc/h ?-Continue full ICU care. ? ? ? ?SIGNATURE  ? ? ?Dr. Brand Males, M.D., F.C.C.P,  ?Pulmonary and Critical Care Medicine ?Staff Physician, [place] ?Center Director - Interstitial Lung Disease  Program  ?Pulmonary [place] [facility] ?[place], [place], [address] ? ?NPI Number:  NPI #[id number] ?DEA Number: [id number] ? ?Pager: [phone], If no answer  -> Check AMION or Try [phone] ?Telephone (clinical office): [phone] ?Telephone (research): [phone] ? ?5:57 PM ?02/22/2022' ? ? ? ?LABS  ? ? ?PULMONARY ?Recent Labs  ?Lab 02/21/22 ?2032 02/22/22 ?0750 02/22/22 ?1055  ?PHART 7.37 7.33* 7.43  ?PCO2ART 33 19* 25*  ?PO2ART 69* 59* 152*  ?HCO3 18.8* 10.3* 17.1*  ?O2SAT 94.8 90.3 100  ? ? ?CBC ?Recent Labs  ?Lab 02/21/22 ?2038 02/22/22 ?0303 02/22/22 ?7209 02/22/22 ?4709  ?HGB 10.2* 9.4*  --  8.9*  ?HCT 28.7* 27.4*  --  25.1*  ?WBC 25.0* 23.2*  --  19.5*  ?PLT 94* 95* 64* 64*  ? ? ?COAGULATION ?Recent Labs  ?Lab 02/22/22 ?6283  ?INR 1.5*  ? ? ?CARDIAC ? No results for input(s): TROPONINI in the last 168 hours. ?No results for input(s): PROBNP in the last 168 hours. ? ? ?CHEMISTRY ?Recent Labs  ?Lab 02/21/22 ?0507 02/21/22 ?2038 02/22/22 ?0410 02/22/22 ?6629 02/22/22 ?4765 02/22/22 ?1413  ?NA 138 136  --  131* 131* 139  ?K 4.5 4.8  --  4.4 4.5 3.1*  ?CL  108 107  --  103 103 106  ?CO2 23 17*  --  12* 11* 23  ?GLUCOSE 255* 206* 491* 578* 589* 237*  ?BUN 26* 35*  --  45* 48* 47*  ?CREATININE 1.87* 2.19*  --  2.40* 2.50* 1.98*  ?CALCIUM 9.3 9.4  --  8.7* 8.6* 8.4*  ? ?Estimated Creatinine Clearance: 23.1 mL/min (A) (by C-G formula based on SCr of 1.98 mg/dL (H)). ? ? ?LIVER ?Recent Labs  ?Lab 02/07/2022 ?4650 02/22/22 ?3546 02/22/22 ?0758  ?AST 17 73*  --   ?ALT 15 53*  --   ?ALKPHOS 93 395*  --   ?BILITOT 0.9 1.5*  --   ?PROT 7.1 6.8  --   ?ALBUMIN 3.7 3.2*  --   ?INR  --   --  1.5*  ? ? ? ?INFECTIOUS ?Recent Labs  ?Lab 02/22/22 ?0758 02/22/22 ?1413  ?LATICACIDVEN >9.0* 5.7*  ?PROCALCITON 2.42  --   ? ? ? ?ENDOCRINE ?CBG (last 3)  ?Recent Labs  ?  02/22/22 ?1436 02/22/22 ?1548 02/22/22 ?1656  ?GLUCAP 214* 206* 204*  ? ? ? ? ? ? ? ?IMAGING x48h  - image(s) personally visualized  -   highlighted in bold ?DG Abd 1 View ? ?Result Date: 02/22/2022 ?CLINICAL DATA:  OG tube placement. EXAM: ABDOMEN - 1 VIEW COMPARISON:  Chest radiograph-earlier same day FINDINGS: Enteric tube tip and side port projected over the expected location of the gastric antrum Paucity of bowel gas without evidence of enteric obstruction. No pneumoperitoneum, pneumatosis or portal venous gas Post cholecystectomy. Additional surgical clip overlies the right mid abdomen, potentially a displaced cholecystectomy clip. Limited visualization the lower thorax demonstrates a trace left-sided effusion with associated bibasilar heterogeneous/consolidative opacities, left greater than right. No acute osseous abnormalities. IMPRESSION: Enteric tube tip and side port project over the expected location of the gastric antrum. Electronically Signed   By: Sandi Mariscal M.D.   On: 02/22/2022 10:09  ? ?CT HEAD WO CONTRAST (5MM) ? ?Result Date: 02/21/2022 ?CLINICAL DATA:  Mental status changes, sickle cell pain crisis. EXAM: CT HEAD WITHOUT CONTRAST TECHNIQUE: Contiguous axial images were obtained from the base of the skull  through the vertex without intravenous contrast. RADIATION DOSE REDUCTION: This exam was performed according to the departmental dose-optimization program which includes automated exposure control, adjustment of the mA and/or kV according to patient size and/or use of iterative reconstruction technique. COMPARISON:  CTA 07/23/2021. FINDINGS: Brain: No evidence of acute infarction, hemorrhage, hydrocephalus, extra-axial collection or mass lesion/mass effect. Partially empty sella turcica is again shown. Vascular: There calcifications in the carotid siphons and V4 right vertebral artery. There are no hyperdense central vessels. Skull: Normal. Negative for fracture or focal lesion. Sinuses/Orbits/mastoid aeration: No acute finding. Old lens extractions. There is trace fluid in the mastoid tips. This was seen previously and seems unchanged. Other: There is bilateral proliferated intraorbital fat again noted with associated symmetric proptosis. There is no extraocular muscle thickening. This is unchanged. IMPRESSION: No acute intracranial CT findings or interval changes. Electronically Signed   By: Telford Nab M.D.   On: 02/21/2022 21:47  ? ?DG CHEST PORT 1 VIEW ? ?Result Date: 02/22/2022 ?CLINICAL DATA:  Central line placement EXAM: PORTABLE CHEST 1 VIEW COMPARISON:  Radiograph 02/22/2022 FINDINGS: Endotracheal tube tip is unchanged in position. There is new right neck catheter with tip overlying the distal superior vena cava/superior cavoatrial junction. Orogastric tube passes below the diaphragm, tip excluded by collimation. Unchanged cardiomediastinal silhouette. Unchanged mid to lower lung airspace disease and interstitial prominence. Probable small pleural effusions. No visible pneumothorax. No acute osseous abnormality. IMPRESSION: Right neck central venous catheter tip overlies the distal superior vena cava/superior cavoatrial junction. Unchanged left greater than right mid to lower lung airspace disease and  small effusion. Electronically Signed   By: Maurine Simmering M.D.   On: 02/22/2022 13:23  ? ?DG CHEST PORT 1 VIEW ? ?Result Date: 02/22/2022 ?CLINICAL DATA:  Intubation EXAM: PORTABLE CHEST 1 VIEW COMPARISON:  02/21/2022; 02/19/2022 FINDINGS: Grossly unchanged enlarged cardiac silhouette and mediastinal contours with atherosclerotic plaque within the thoracic aorta. Endotracheal tube overlies the tracheal air column with tip approximately 3.8 cm superior to the carina. Enteric tube tip terminates below the left hemidiaphragm. Pulmonary vasculature is indistinct with cephalization of flow. Trace bilateral effusions with associated bibasilar heterogeneous/consolidative opacities, left greater than right. No pneumothorax. No acute osseous abnormalities. IMPRESSION: 1. Appropriately positioned support apparatus as above. No pneumothorax. 2. Findings most suggestive of pulmonary edema with trace bilateral effusions and associated bibasilar opacities, left greater than right, atelectasis versus infiltrate/aspiration. Continued attention on follow-up is advised. Electronically Signed   By: Sandi Mariscal M.D.   On: 02/22/2022 10:10  ? ?DG Chest Port 1 View ? ?Result Date: 02/21/2022 ?CLINICAL DATA:  Acute respiratory distress EXAM: PORTABLE CHEST 1 VIEW COMPARISON:  01/29/2022, 02/18/2022 FINDINGS:  Interim development of right greater than basilar airspace disease. No pleural effusion. Stable cardiomediastinal silhouette with aortic atherosclerosis. No pneumothorax IMPRESSION: Interim right greater than left basilar airspace disease. Electronically Signed   By: Donavan Foil M.D.   On: 02/21/2022 20:55  ? ?ECHOCARDIOGRAM COMPLETE ? ?Result Date: 02/22/2022 ?   ECHOCARDIOGRAM REPORT   Patient Name:   [name] Date of Exam: 02/22/2022 Medical Rec #:  [id number]         Height:       63.0 in Accession #:    [id number]        Weight:       145.7 lb Date of Birth:  [birth year]         BSA:          1.690 m? Patient Age:    76 years           BP:           194/74 mmHg Patient Gender: F                 HR:           98 bpm. Exam Location:  Inpatient Procedure: Cardiac Doppler, Color Doppler, Strain Analysis and 2D Echo Indications:    Acu

## 2022-02-22 NOTE — Progress Notes (Addendum)
Rapid Response Event Note  ? ?Reason for Call : decreased LOC  ? ?Initial Focused Assessment: pt lying bed very  lethargic, with husband at her bedside.  Pt primary RN states pt did not look like this last night.  Pupils intact.  Unable to follow any commands. ? ? ?Interventions: Per protocol EKG, ABG, CBC, BMET orders placed.  Pt mouth full of old food.  Pt mouth suctioned and a lot of the food removed at least what could be seen.  Pt given narcan x 3 with not a lot of response.  Pt opened eyes, moaned but did not stay awake.  CT scan ordered pr MD.  MD at bedside to evaluate pt and placing orders. See flow sheet for VS. PCXR complete. ? ? ?Plan of Care:  Pt transferred to ICU/SD dt after CT scan for closer monitoring and to start a narcan gtt. ? ? ? ?Event Summary:  ? ?MD Notified: yes  ?Call [id number] ?Arrival Time: 43 ?End Time: 2150 ? ?Dyann Ruddle, RN ?

## 2022-02-22 NOTE — Progress Notes (Signed)
Pt lethargic this morning. Pt not following commands. Around 0817, tried to place NG tube, but pt vomitted dark black red blood. Dr. Chase Caller notified. Pt intubated at 0846. Pt was also given 2 amps of bicarb due to being acidoic & 88ml of neo due to low BP.  ?

## 2022-02-22 NOTE — Consult Note (Addendum)
?                    NEURO HOSPITALIST CONSULT NOTE  ? ?Requesting physician: Dr. Linda Hedges ? ?Reason for Consult: AMS ? ?History obtained from:   Chart    ? ?HPI:                                                                                                                                         ? [name] is an 76 y.o. female with CAD, DM, HTN, renal insufficiency and sickle cell anemia who was admitted to the hospital on Thursday for evaluation of complaints of pain to anterior chest wall and right lower extremity that were consistent with her previous sickle cell crisis. She had recently been evaluated in the ED for this problem 2 days earlier. The patient had been taking her home oxycodone consistently without any sustained relief. Patient reports that pain was worsening, prompting her to return to the emergency department. On initial evaluation by the hospitalist team, the patient currently denied any associated shortness of breath, palpitations, nausea, vomiting, dizziness, diaphoresis, or presyncope. She was placed on a Dilaudid via PCA pump. On 3/24, the patient was noted to have become sleepy and unable to rate her pain. Dilaudid was changed from PCA pump at that time to 0.5 mg every 3 hours as needed for severe breakthrough pain. Also was treated with Oxycodone 5 mg every 4 hours as needed.  ? ?In the evening on Friday, she desaturated. Per RN note: "Approximately 2000 called to patient's room by nurse noting that patient's O2 alarm was ringing that patient's O2 was 84% and patient wasn't responding and that O2 was increased to 4 LPM/[place] and O2 was at 90-91% upon assessment Pt unresponsive CBG 196 RRT initiated". ABG was obtained and Rapid Response was called to evaluate. Per rapid response note: "pt lying bed very  lethargic, with husband at her bedside.  Pt primary RN states pt did not look like this last night.  Pupils intact.  Unable to follow any commands.Marland Kitchen "Per protocol EKG, ABG, CBC, BMET  orders placed.  Pt mouth full of old food.  Pt mouth suctioned and a lot of the food removed at least what could be seen.  Pt given narcan x 3 with not a lot of response.  Pt opened eyes, moaned but did not stay awake.  CT scan ordered pr MD.  MD at bedside to evaluate pt and placing orders. See flow sheet for VS. PCXR complete." ? ?CT head showed no acute intracranial findings or interval changes ?  ?Neurology was then called to evaluate.  ? ?Past Medical History:  ?Diagnosis Date  ? Abdominal pain   ? Coronary artery disease   ? Diabetes mellitus without complication ([place])   ? Glaucoma   ? Hypertension   ? Hypothyroid   ? Renal insufficiency 09/01/2017  ?  Sickle cell anemia (HCC)   ? ? ?Past Surgical History:  ?Procedure Laterality Date  ? CHOLECYSTECTOMY    ? HIP SURGERY    ? 6 yrs ago  ? REFRACTIVE SURGERY    ? RETINAL DETACHMENT SURGERY    ? ? ?Family History  ?Problem Relation Age of Onset  ? Diabetes Mother   ? Cancer Father   ?     Prostate  ? Diabetes Father   ? Cancer Brother   ? Diabetes Brother   ? Breast cancer Maternal Aunt   ?     does not know age  ?          ? ?Social History:  reports that she has quit smoking. She has never used smokeless tobacco. She reports that she does not drink alcohol and does not use drugs. ? ?Allergies  ?Allergen Reactions  ? Bee Venom Swelling  ? Nsaids Other (See Comments)  ?  Dyspepsia. Advised by Primary Provider to avoid because of Kidneys  ? ? ?MEDICATIONS:                                                                                                                     ?Prior to Admission:  ?Medications Prior to Admission  ?Medication Sig Dispense Refill Last Dose  ? acetaminophen (TYLENOL) 500 MG tablet Take 1,000 mg by mouth every 6 (six) hours as needed for mild pain or moderate pain.    02/19/2022  ? amLODipine (NORVASC) 10 MG tablet Take 10 mg by mouth daily.   02/19/2022  ? aspirin EC 81 MG tablet Take 81 mg by mouth daily.   02/19/2022  ? atorvastatin (LIPITOR)  40 MG tablet Take 40 mg by mouth at bedtime.    02/19/2022  ? Cholecalciferol (VITAMIN D3) 1.25 MG (50000 UT) CAPS Take 50,000 capsules by mouth once a week.   02/17/2022  ? ferrous sulfate 325 (65 FE) MG tablet Take 325 mg by mouth daily with breakfast.   02/19/2022  ? folic acid (FOLVITE) 1 MG tablet Take 1 mg by mouth daily.   02/19/2022  ? glipiZIDE (GLUCOTROL XL) 5 MG 24 hr tablet Take 5 mg by mouth 3 (three) times a week. Jory Sims, Fri   02/19/2022  ? insulin glargine (LANTUS) 100 UNIT/ML injection Inject 0.18 mLs (18 Units total) into the skin at bedtime. 10 mL 11 02/18/2022  ? levothyroxine (SYNTHROID) 125 MCG tablet Take 125 mcg by mouth daily.   02/19/2022  ? Lifitegrast (XIIDRA) 5 % SOLN Place 1 drop into both eyes 2 (two) times daily.   02/18/2022  ? losartan (COZAAR) 100 MG tablet Take 0.5 tablets (50 mg total) by mouth daily. (Patient taking differently: Take 100 mg by mouth daily.) 30 tablet 0 02/19/2022  ? LUMIGAN 0.01 % SOLN Place 1 drop into both eyes at bedtime. Separate by at least 10 minutes from other intraocular pressure reducing ophthalmic drugs   02/19/2022  ? meclizine (ANTIVERT) 25 MG tablet Take 25 mg by  mouth 3 (three) times daily as needed for dizziness.   unk  ? metoprolol succinate (TOPROL-XL) 50 MG 24 hr tablet Take 50 mg by mouth at bedtime. Take with or immediately following a meal.   02/18/2022 at 2100  ? RHOPRESSA 0.02 % SOLN Place 1 drop into the left eye at bedtime.   02/18/2022  ? vitamin C (ASCORBIC ACID) 500 MG tablet Take 500 mg by mouth daily.   02/18/2022  ? cycloSPORINE (RESTASIS) 0.05 % ophthalmic emulsion Place 1 drop into both eyes 2 (two) times daily. (Patient not taking: Reported on 02/04/2022)   Not Taking  ? levothyroxine (SYNTHROID) 175 MCG tablet Take 175 mcg by mouth See admin instructions. Taking daily except Sat & Wed (Patient not taking: Reported on 02/07/2022)   Not Taking  ? Oxycodone HCl 10 MG TABS Take 0.5 tablets (5 mg total) by mouth every 6 (six) hours as needed.  (Patient not taking: Reported on 02/18/2022) 30 tablet 0 Not Taking  ? ?Scheduled: ? vitamin C  500 mg Oral Daily  ? chlorhexidine gluconate (MEDLINE KIT)  15 mL Mouth Rinse BID  ? Chlorhexidine Gluconate Cloth  6 each Topical Daily  ? cycloSPORINE  1 drop Both Eyes BID  ? docusate  100 mg Per Tube BID  ? ferrous sulfate  325 mg Oral Q breakfast  ? folic acid  1 mg Oral Daily  ? latanoprost  1 drop Both Eyes QHS  ? levothyroxine  175 mcg Oral Q0600  ? mouth rinse  15 mL Mouth Rinse 10 times per day  ? Netarsudil Dimesylate  1 drop Left Eye QHS  ? [START ON 02/25/2022] pantoprazole  40 mg Intravenous Q12H  ? polyethylene glycol  17 g Per Tube Daily  ? senna-docusate  1 tablet Oral BID  ? ?Continuous: ? cefTRIAXone (ROCEPHIN)  IV Stopped (02/22/22 0305)  ? dextrose 5% lactated ringers    ? insulin 8.5 Units/hr (02/22/22 1034)  ? lactated ringers 125 mL/hr at 02/22/22 1039  ? metronidazole Stopped (02/22/22 1035)  ? pantoprazole    ? pantoprazole    ? propofol (DIPRIVAN) infusion Stopped (02/22/22 0946)  ? ? ? ?ROS:                                                                                                                                       ?Unable to obtain due to AMS.  ? ?Blood pressure (!) 183/102, pulse (!) 117, temperature 98.2 ?F (36.8 ?C), temperature source Axillary, resp. rate (!) 27, height 5' 3" (1.6 m), weight 66.1 kg, SpO2 95 %. ? ? ?General Examination:                                                                                                      ? ?  Physical Exam  ?HEENT-  [place]/AT. No neck stiffness  ?Lungs- Respirations unlabored ?Extremities- No edema ? ?Neurological Examination ?Mental Status: Lethargic to obtunded. Eyes remain closed throughout the exam. Does not avert eyes towards or away from visual stimuli when eyelids are passively elevated. Verbal output consists of incomprehensible murmuring to stimuli. No attempts to communicate. Does not alert to voice or respond to any questions or  commands. Sonorous respirations. Does not localize to sternal rub but will move limbs slightly.   ?Cranial Nerves: ?II: No blink to threat. Pupils round and reactive bilaterally.   ?III,IV, VI: Eyes are n

## 2022-02-22 NOTE — Progress Notes (Signed)
Critical lactic >9.0 @ 0902 ?Critical glucose 589 & troponin 159 @0909  ?Critical serum osmolarity 321 @ 1018 ?Critical lactic 5.7 @ 1451.  ?MD aware of each critical lab after call. See new orders.  ?

## 2022-02-22 NOTE — Progress Notes (Signed)
Approximately 2000 called to patient's room by nurse noting that patient's O2 alarm was ringing that patient's O2 was 84% and patient wasn't responding and that O2 was increased to 4 LPM/[place] and O2 was at 90-91% upon assessment Pt unresponsive CBG 196 RRT initiated ?

## 2022-02-23 ENCOUNTER — Inpatient Hospital Stay (HOSPITAL_COMMUNITY): Payer: 59

## 2022-02-23 DIAGNOSIS — D65 Disseminated intravascular coagulation [defibrination syndrome]: Secondary | ICD-10-CM | POA: Diagnosis not present

## 2022-02-23 DIAGNOSIS — J9601 Acute respiratory failure with hypoxia: Secondary | ICD-10-CM | POA: Diagnosis not present

## 2022-02-23 DIAGNOSIS — I248 Other forms of acute ischemic heart disease: Secondary | ICD-10-CM

## 2022-02-23 DIAGNOSIS — I5021 Acute systolic (congestive) heart failure: Secondary | ICD-10-CM | POA: Diagnosis not present

## 2022-02-23 DIAGNOSIS — R579 Shock, unspecified: Secondary | ICD-10-CM | POA: Diagnosis present

## 2022-02-23 DIAGNOSIS — D5700 Hb-SS disease with crisis, unspecified: Secondary | ICD-10-CM | POA: Diagnosis not present

## 2022-02-23 DIAGNOSIS — Z7189 Other specified counseling: Secondary | ICD-10-CM

## 2022-02-23 DIAGNOSIS — D649 Anemia, unspecified: Secondary | ICD-10-CM

## 2022-02-23 DIAGNOSIS — N179 Acute kidney failure, unspecified: Secondary | ICD-10-CM | POA: Diagnosis not present

## 2022-02-23 DIAGNOSIS — R609 Edema, unspecified: Secondary | ICD-10-CM

## 2022-02-23 DIAGNOSIS — G9340 Encephalopathy, unspecified: Secondary | ICD-10-CM | POA: Diagnosis not present

## 2022-02-23 DIAGNOSIS — N184 Chronic kidney disease, stage 4 (severe): Secondary | ICD-10-CM | POA: Diagnosis not present

## 2022-02-23 LAB — HEPATIC FUNCTION PANEL
ALT: 69 U/L — ABNORMAL HIGH (ref 0–44)
AST: 144 U/L — ABNORMAL HIGH (ref 15–41)
Albumin: 2.3 g/dL — ABNORMAL LOW (ref 3.5–5.0)
Alkaline Phosphatase: 429 U/L — ABNORMAL HIGH (ref 38–126)
Bilirubin, Direct: 0.7 mg/dL — ABNORMAL HIGH (ref 0.0–0.2)
Indirect Bilirubin: 0.3 mg/dL (ref 0.3–0.9)
Total Bilirubin: 1 mg/dL (ref 0.3–1.2)
Total Protein: 5.1 g/dL — ABNORMAL LOW (ref 6.5–8.1)

## 2022-02-23 LAB — GLUCOSE, CAPILLARY
Glucose-Capillary: 150 mg/dL — ABNORMAL HIGH (ref 70–99)
Glucose-Capillary: 159 mg/dL — ABNORMAL HIGH (ref 70–99)
Glucose-Capillary: 169 mg/dL — ABNORMAL HIGH (ref 70–99)
Glucose-Capillary: 182 mg/dL — ABNORMAL HIGH (ref 70–99)
Glucose-Capillary: 194 mg/dL — ABNORMAL HIGH (ref 70–99)
Glucose-Capillary: 199 mg/dL — ABNORMAL HIGH (ref 70–99)
Glucose-Capillary: 201 mg/dL — ABNORMAL HIGH (ref 70–99)
Glucose-Capillary: 236 mg/dL — ABNORMAL HIGH (ref 70–99)
Glucose-Capillary: 256 mg/dL — ABNORMAL HIGH (ref 70–99)
Glucose-Capillary: 276 mg/dL — ABNORMAL HIGH (ref 70–99)

## 2022-02-23 LAB — BLOOD GAS, ARTERIAL
Acid-base deficit: 13.8 mmol/L — ABNORMAL HIGH (ref 0.0–2.0)
Bicarbonate: 13 mmol/L — ABNORMAL LOW (ref 20.0–28.0)
Drawn by: 331471
FIO2: 100 %
O2 Saturation: 91 %
Patient temperature: 37
pCO2 arterial: 34 mmHg (ref 32–48)
pH, Arterial: 7.19 — CL (ref 7.35–7.45)
pO2, Arterial: 75 mmHg — ABNORMAL LOW (ref 83–108)

## 2022-02-23 LAB — CK TOTAL AND CKMB (NOT AT ARMC)
CK, MB: 17.5 ng/mL — ABNORMAL HIGH (ref 0.5–5.0)
CK, MB: 24.5 ng/mL — ABNORMAL HIGH (ref 0.5–5.0)
Relative Index: 2.3 (ref 0.0–2.5)
Relative Index: 3.5 — ABNORMAL HIGH (ref 0.0–2.5)
Total CK: 765 U/L — ABNORMAL HIGH (ref 38–234)
Total CK: 701 U/L — ABNORMAL HIGH (ref 38–234)

## 2022-02-23 LAB — RETICULOCYTES
Immature Retic Fract: 23.8 % — ABNORMAL HIGH (ref 2.3–15.9)
RBC.: 1.69 MIL/uL — ABNORMAL LOW (ref 3.87–5.11)
Retic Count, Absolute: 29.6 10*3/uL (ref 19.0–186.0)
Retic Ct Pct: 1.8 % (ref 0.4–3.1)

## 2022-02-23 LAB — TROPONIN I (HIGH SENSITIVITY)
Troponin I (High Sensitivity): 1200 ng/L (ref <18)
Troponin I (High Sensitivity): 1602 ng/L (ref <18)
Troponin I (High Sensitivity): 1990 ng/L (ref <18)

## 2022-02-23 LAB — BASIC METABOLIC PANEL
Anion gap: 11 (ref 5–15)
Anion gap: 12 (ref 5–15)
BUN: 47 mg/dL — ABNORMAL HIGH (ref 8–23)
BUN: 46 mg/dL — ABNORMAL HIGH (ref 8–23)
CO2: 22 mmol/L (ref 22–32)
CO2: 21 mmol/L — ABNORMAL LOW (ref 22–32)
Calcium: 8.4 mg/dL — ABNORMAL LOW (ref 8.9–10.3)
Calcium: 8.3 mg/dL — ABNORMAL LOW (ref 8.9–10.3)
Chloride: 108 mmol/L (ref 98–111)
Chloride: 109 mmol/L (ref 98–111)
Creatinine, Ser: 1.94 mg/dL — ABNORMAL HIGH (ref 0.44–1.00)
Creatinine, Ser: 1.93 mg/dL — ABNORMAL HIGH (ref 0.44–1.00)
GFR, Estimated: 27 mL/min — ABNORMAL LOW (ref >60)
GFR, Estimated: 27 mL/min — ABNORMAL LOW (ref >60)
Glucose, Bld: 178 mg/dL — ABNORMAL HIGH (ref 70–99)
Glucose, Bld: 214 mg/dL — ABNORMAL HIGH (ref 70–99)
Potassium: 3.8 mmol/L (ref 3.5–5.1)
Potassium: 4.2 mmol/L (ref 3.5–5.1)
Sodium: 141 mmol/L (ref 135–145)
Sodium: 142 mmol/L (ref 135–145)

## 2022-02-23 LAB — CBC
HCT: 20.5 % — ABNORMAL LOW (ref 36.0–46.0)
Hemoglobin: 7.7 g/dL — ABNORMAL LOW (ref 12.0–15.0)
MCH: 30.9 pg (ref 26.0–34.0)
MCHC: 37.6 g/dL — ABNORMAL HIGH (ref 30.0–36.0)
MCV: 82.3 fL (ref 80.0–100.0)
Platelets: 56 10*3/uL — ABNORMAL LOW (ref 150–400)
RBC: 2.49 MIL/uL — ABNORMAL LOW (ref 3.87–5.11)
RDW: 17.4 % — ABNORMAL HIGH (ref 11.5–15.5)
WBC: 9.3 10*3/uL (ref 4.0–10.5)
nRBC: 19.2 % — ABNORMAL HIGH (ref 0.0–0.2)

## 2022-02-23 LAB — TRIGLYCERIDES: Triglycerides: 234 mg/dL — ABNORMAL HIGH (ref <150)

## 2022-02-23 LAB — COMPREHENSIVE METABOLIC PANEL
ALT: 760 U/L — ABNORMAL HIGH (ref 0–44)
AST: 921 U/L — ABNORMAL HIGH (ref 15–41)
Albumin: 1.7 g/dL — ABNORMAL LOW (ref 3.5–5.0)
Alkaline Phosphatase: 387 U/L — ABNORMAL HIGH (ref 38–126)
BUN: 50 mg/dL — ABNORMAL HIGH (ref 8–23)
CO2: <7 mmol/L — ABNORMAL LOW (ref 22–32)
Calcium: 7.4 mg/dL — ABNORMAL LOW (ref 8.9–10.3)
Chloride: 105 mmol/L (ref 98–111)
Creatinine, Ser: 2.89 mg/dL — ABNORMAL HIGH (ref 0.44–1.00)
GFR, Estimated: 17 mL/min — ABNORMAL LOW (ref >60)
Glucose, Bld: 245 mg/dL — ABNORMAL HIGH (ref 70–99)
Potassium: 5.9 mmol/L — ABNORMAL HIGH (ref 3.5–5.1)
Sodium: 143 mmol/L (ref 135–145)
Total Bilirubin: 1.2 mg/dL (ref 0.3–1.2)
Total Protein: 4 g/dL — ABNORMAL LOW (ref 6.5–8.1)

## 2022-02-23 LAB — COOXEMETRY PANEL
Carboxyhemoglobin: 1.6 % — ABNORMAL HIGH (ref 0.5–1.5)
Methemoglobin: 1.7 % — ABNORMAL HIGH (ref 0.0–1.5)
O2 Saturation: 98.6 %
Total hemoglobin: <6.9 g/dL — CL (ref 12.0–16.0)

## 2022-02-23 LAB — PROTIME-INR
INR: 1.7 — ABNORMAL HIGH (ref 0.8–1.2)
INR: 3.1 — ABNORMAL HIGH (ref 0.8–1.2)
Prothrombin Time: 19.6 seconds — ABNORMAL HIGH (ref 11.4–15.2)
Prothrombin Time: 32.2 seconds — ABNORMAL HIGH (ref 11.4–15.2)

## 2022-02-23 LAB — PROCALCITONIN: Procalcitonin: 5.45 ng/mL

## 2022-02-23 LAB — LACTIC ACID, PLASMA
Lactic Acid, Venous: 5.5 mmol/L (ref 0.5–1.9)
Lactic Acid, Venous: >9 mmol/L (ref 0.5–1.9)
Lactic Acid, Venous: >9 mmol/L (ref 0.5–1.9)

## 2022-02-23 LAB — MAGNESIUM: Magnesium: 1.8 mg/dL (ref 1.7–2.4)

## 2022-02-23 LAB — PREPARE RBC (CROSSMATCH)

## 2022-02-23 LAB — BETA-HYDROXYBUTYRIC ACID
Beta-Hydroxybutyric Acid: 0.45 mmol/L — ABNORMAL HIGH (ref 0.05–0.27)
Beta-Hydroxybutyric Acid: 0.41 mmol/L — ABNORMAL HIGH (ref 0.05–0.27)

## 2022-02-23 LAB — PHOSPHORUS: Phosphorus: 2.7 mg/dL (ref 2.5–4.6)

## 2022-02-23 LAB — ABO/RH: ABO/RH(D): O NEG

## 2022-02-23 LAB — LIPASE, BLOOD: Lipase: 45 U/L (ref 11–51)

## 2022-02-23 LAB — AMYLASE: Amylase: 444 U/L — ABNORMAL HIGH (ref 28–100)

## 2022-02-23 LAB — ACETAMINOPHEN LEVEL: Acetaminophen (Tylenol), Serum: <10 ug/mL — ABNORMAL LOW (ref 10–30)

## 2022-02-23 LAB — SALICYLATE LEVEL: Salicylate Lvl: 8.5 mg/dL (ref 7.0–30.0)

## 2022-02-23 LAB — APTT: aPTT: 75 seconds — ABNORMAL HIGH (ref 24–36)

## 2022-02-23 MED ORDER — LEVOTHYROXINE SODIUM 75 MCG PO TABS: 175.0000 ug | ORAL_TABLET | Freq: Every day | ORAL | Status: DC

## 2022-02-23 MED ORDER — SODIUM CHLORIDE 0.9% IV SOLUTION: Freq: Once | INTRAVENOUS | Status: AC

## 2022-02-23 MED ORDER — INSULIN REGULAR(HUMAN) IN NACL 100-0.9 UT/100ML-% IV SOLN
INTRAVENOUS | Status: DC
  Administered 2022-02-23: 3.8 [IU]/h via INTRAVENOUS
  Filled 2022-02-23: qty 100

## 2022-02-23 MED ORDER — EPINEPHRINE 1 MG/10ML IJ SOSY
PREFILLED_SYRINGE | INTRAMUSCULAR | Status: AC
  Filled 2022-02-23: qty 20

## 2022-02-23 MED ORDER — MAGNESIUM SULFATE 2 GM/50ML IV SOLN
2.0000 g | Freq: Once | INTRAVENOUS | Status: AC
  Administered 2022-02-23: 2 g via INTRAVENOUS
  Filled 2022-02-23: qty 50

## 2022-02-23 MED ORDER — SODIUM BICARBONATE 8.4 % IV SOLN: 100.0000 meq | Freq: Once | INTRAVENOUS | Status: AC

## 2022-02-23 MED ORDER — PHENYLEPHRINE 40 MCG/ML (10ML) SYRINGE FOR IV PUSH (FOR BLOOD PRESSURE SUPPORT)
PREFILLED_SYRINGE | INTRAVENOUS | Status: AC
  Filled 2022-02-23: qty 10

## 2022-02-23 MED ORDER — SODIUM CHLORIDE 0.9 % IV BOLUS
500.0000 mL | Freq: Once | INTRAVENOUS | Status: AC
  Administered 2022-02-23: 500 mL via INTRAVENOUS

## 2022-02-23 MED ORDER — METHYLPREDNISOLONE SODIUM SUCC 125 MG IJ SOLR: 125.0000 mg | Freq: Three times a day (TID) | INTRAMUSCULAR | Status: DC

## 2022-02-23 MED ORDER — INSULIN DETEMIR 100 UNIT/ML ~~LOC~~ SOLN
15.0000 [IU] | Freq: Two times a day (BID) | SUBCUTANEOUS | Status: DC
  Administered 2022-02-23: 15 [IU] via SUBCUTANEOUS
  Filled 2022-02-23 (×2): qty 0.15

## 2022-02-23 MED ORDER — INSULIN ASPART 100 UNIT/ML IJ SOLN
1.0000 [IU] | INTRAMUSCULAR | Status: DC
  Administered 2022-02-23: 2 [IU] via SUBCUTANEOUS

## 2022-02-23 MED ORDER — POLYETHYLENE GLYCOL 3350 17 G PO PACK: 17.0000 g | PACK | Freq: Every day | ORAL | Status: DC | PRN

## 2022-02-23 MED ORDER — EPINEPHRINE 1 MG/10ML IJ SOSY
PREFILLED_SYRINGE | INTRAMUSCULAR | Status: AC
  Administered 2022-02-23: 1 mg
  Filled 2022-02-23: qty 20

## 2022-02-23 MED ORDER — VASOPRESSIN 20 UNITS/100 ML INFUSION FOR SHOCK
0.0000 [IU]/min | INTRAVENOUS | Status: DC
  Administered 2022-02-23: 0.03 [IU]/min via INTRAVENOUS
  Filled 2022-02-23: qty 100

## 2022-02-23 MED ORDER — ASCORBIC ACID 500 MG PO TABS
500.0000 mg | ORAL_TABLET | Freq: Every day | ORAL | Status: DC
  Administered 2022-02-23: 500 mg

## 2022-02-23 MED ORDER — SODIUM BICARBONATE 8.4 % IV SOLN
INTRAVENOUS | Status: AC
  Filled 2022-02-23: qty 100

## 2022-02-23 MED ORDER — SENNOSIDES-DOCUSATE SODIUM 8.6-50 MG PO TABS: 1.0000 | ORAL_TABLET | Freq: Two times a day (BID) | ORAL | Status: DC

## 2022-02-23 MED ORDER — NALOXONE HCL 0.4 MG/ML IJ SOLN
0.4000 mg | INTRAMUSCULAR | Status: DC | PRN
  Administered 2022-02-23: 0.4 mg via INTRAVENOUS
  Filled 2022-02-23: qty 1

## 2022-02-23 MED ORDER — FOLIC ACID 1 MG PO TABS
1.0000 mg | ORAL_TABLET | Freq: Every day | ORAL | Status: DC
  Administered 2022-02-23: 1 mg

## 2022-02-23 MED ORDER — DEXTROSE 50 % IV SOLN: 0.0000 mL | INTRAVENOUS | Status: DC | PRN

## 2022-02-23 MED ORDER — SODIUM BICARBONATE 8.4 % IV SOLN
INTRAVENOUS | Status: DC
  Filled 2022-02-23: qty 150

## 2022-02-23 MED ORDER — NOREPINEPHRINE 4 MG/250ML-% IV SOLN
0.0000 ug/min | INTRAVENOUS | Status: DC
  Administered 2022-02-23 (×2): 40 ug/min via INTRAVENOUS
  Administered 2022-02-23: 2 ug/min via INTRAVENOUS
  Administered 2022-02-23: 40 ug/min via INTRAVENOUS
  Filled 2022-02-23 (×5): qty 250

## 2022-02-23 MED ORDER — PHENYLEPHRINE HCL-NACL 20-0.9 MG/250ML-% IV SOLN
0.0000 ug/min | INTRAVENOUS | Status: DC
  Administered 2022-02-23: 20 ug/min via INTRAVENOUS
  Filled 2022-02-23: qty 250

## 2022-02-23 MED ORDER — NOREPINEPHRINE 16 MG/250ML-% IV SOLN
0.0000 ug/min | INTRAVENOUS | Status: DC
  Administered 2022-02-23: 40 ug/min via INTRAVENOUS
  Filled 2022-02-23: qty 250

## 2022-02-23 MED ORDER — FERROUS SULFATE 220 (44 FE) MG/5ML PO ELIX
220.0000 mg | ORAL_SOLUTION | Freq: Every day | ORAL | Status: DC
  Filled 2022-02-23: qty 5

## 2022-02-24 LAB — DIC (DISSEMINATED INTRAVASCULAR COAGULATION)PANEL
D-Dimer, Quant: >20 ug/mL-FEU — ABNORMAL HIGH (ref 0.00–0.50)
Fibrinogen: 505 mg/dL — ABNORMAL HIGH (ref 210–475)
INR: 1.7 — ABNORMAL HIGH (ref 0.8–1.2)
Platelets: 26 10*3/uL — CL (ref 150–400)
Prothrombin Time: 19.9 seconds — ABNORMAL HIGH (ref 11.4–15.2)
aPTT: 45 seconds — ABNORMAL HIGH (ref 24–36)

## 2022-02-24 LAB — BPAM RBC
Blood Product Expiration Date: 202304222359
ISSUE DATE / TIME: 202303261711
Unit Type and Rh: 9500

## 2022-02-24 LAB — PATHOLOGIST SMEAR REVIEW

## 2022-02-24 LAB — TYPE AND SCREEN
ABO/RH(D): O NEG
Antibody Screen: NEGATIVE
Unit division: 0

## 2022-02-24 LAB — LEGIONELLA PNEUMOPHILA SEROGP 1 UR AG: L. pneumophila Serogp 1 Ur Ag: NEGATIVE

## 2022-02-24 LAB — CALCITRIOL (1,25 DI-OH VIT D): Vit D, 1,25-Dihydroxy: 47.9 pg/mL (ref 24.8–81.5)

## 2022-02-24 LAB — HEPARIN INDUCED PLATELET AB (HIT ANTIBODY): Heparin Induced Plt Ab: 0.094 OD (ref 0.000–0.400)

## 2022-02-27 LAB — CULTURE, BLOOD (ROUTINE X 2)
Culture: NO GROWTH
Culture: NO GROWTH
Special Requests: ADEQUATE
Special Requests: ADEQUATE

## 2022-03-01 NOTE — Procedures (Signed)
PATIENT NAME: [name] ?MEDICAL RECORD NUMBER: [id number] ?Birthday: August 06, 1946  Age: 76 y.o. ?Admit Date: 02/27/2022 ? ?Provider: Dr Brand Males ? ?Indication: PEA arrest ? ?Technical Description: ? ?CPR performance duration: 17:28 - 17:43 ?Was defibrillation or cardioversion used ? no ?Was external pacer placed ? no ?Was patient intubated pre/post CPR ? Was in refractory shock pre CPR. MODS on vent ?Was transvenous pacer placed ? no ? ?Medications Administered Include ? ?    Yes/no ?Amiodarone   ?Atropin   ?Calcium   ?Epinephrine X 7  ?Lidocaine   ?Magnesium   ?Norepinephrine Already going  ?Phenylephrine Alread going  ?Sodium bicarbonate X 1  ?Vasopression Alredy going  ? ?Evaluation ?Final Status - Was patient successfully resuscitated ? no ?If successfully resuscitated - what is current rhythm ? deceased ?If successfully resuscitated - what is current hemodynamic status ? desceased ? ?Miscellaneous Information ?Time of death 17.43 03-23-2022 ? ?Husband Domonic Hiscox [phone] -> 5:53 PM updated. He is on his way ? ? ? ?SIGNATURE  ? ? ?Dr. Brand Males, M.D., F.C.C.P,  ?Pulmonary and Critical Care Medicine ?Staff Physician, [place] ?Center Director - Interstitial Lung Disease  Program  ?Pulmonary [facility] ?[place], [place], [address] ? ?NPI Number:  NPI #[id number] ?DEA Number: [id number] ? ?Pager: [phone], If no answer  -> Check AMION or Try [phone] ?Telephone (clinical office): [phone] ?Telephone (research): [phone] ? ?5:52 PM ?2022/03/23 ? ?

## 2022-03-01 NOTE — Progress Notes (Signed)
BLE venous duplex has been completed ? ?Results can be found under chart review under CV PROC. ?2022-03-06 4:35 PM ?Deneane Stifter RVT, RDMS ? ?

## 2022-03-01 NOTE — Progress Notes (Signed)
Patient transported to MRI and became unstable after moved to the MRI table. MD was called and advise to abort MRI. Patient was returned back to her 0165 without complication. ?

## 2022-03-01 NOTE — Discharge Summary (Signed)
DISCHARGE SUMMARY ? ? ? ?Date of admit: 02/17/2022  7:28 AM ?Date of discharge: 03/10/22  8:18 PM ?Length of Stay: 3 days ? ?PCP is Vincente Liberty, MD ? ?CAUSE(S) OF DEATH ? ?Sickle Cell Crisis ? ? ?PROBLEM LIST ?Principal Problem: ?  Sickle cell pain crisis ([place]) ?Active Problems: ?  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  Acute renal failure superimposed on stage 4 chronic kidney disease ([place]) ?  Controlled type 2 diabetes mellitus with stable proliferative retinopathy of both eyes, with long-term current use of insulin ([place]) ?  Sickle cell disease with crisis ([place]) ?  Obtundation ?  Aspiration pneumonia ([place]) ?  Shock circulatory ([place]) ?  Acute respiratory failure with hypoxia ([place]) ?  Coma ([place]) ?  Acute systolic heart failure ([place]) ?  GI bleed ?  Lactic acidosis ?  DKA (diabetic ketoacidosis) ([place]) ?  Goals of care, counseling/discussion ?  TTP (thrombotic thrombocytopenic purpura) (HCC) ?  Chronic pain syndrome ?  DM2 (diabetes mellitus, type 2) ([place]) ?  HTN (hypertension) ?  Primary open angle glaucoma of both eyes, moderate stage ?  Hypothyroid ? ? ? ?Present on Admission ?Present on Admission: ? Sickle cell pain crisis ([place]) ? Acute renal failure superimposed on stage 4 chronic kidney disease ([place]) ? Chronic pain syndrome ? HTN (hypertension) ? Hypothyroid ? Primary open angle glaucoma of both eyes, moderate stage ? Shock circulatory ([place]) ? Acute systolic heart failure ([place]) ? GI bleed ? Sickle cell disease with crisis ([place]) ? 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 ? ?Active Problems ?Principal Problem: ?  Sickle cell pain crisis ([place]) ?Active Problems: ?  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  Acute renal failure superimposed on stage 4 chronic kidney disease ([place]) ?  Controlled type 2 diabetes mellitus with stable proliferative retinopathy of both eyes, with long-term current use of insulin ([place]) ?  Sickle cell disease with crisis  ([place]) ?  Obtundation ?  Aspiration pneumonia ([place]) ?  Shock circulatory ([place]) ?  Acute respiratory failure with hypoxia ([place]) ?  Coma ([place]) ?  Acute systolic heart failure ([place]) ?  GI bleed ?  Lactic acidosis ?  DKA (diabetic ketoacidosis) ([place]) ?  Goals of care, counseling/discussion ?  TTP (thrombotic thrombocytopenic purpura) (HCC) ?  Chronic pain syndrome ?  DM2 (diabetes mellitus, type 2) ([place]) ?  HTN (hypertension) ?  Primary open angle glaucoma of both eyes, moderate stage ?  Hypothyroid ? ? ? ? ?SUMMARY ?[name] was 76 y.o. patient with  ? ? has a past medical history of Abdominal pain, Coronary artery disease, Diabetes mellitus without complication ([place]), Glaucoma, Hypertension, Hypothyroid, Renal insufficiency (09/01/2017), and Sickle cell anemia ([place]). ? ? has a past surgical history that includes Refractive surgery; Hip surgery; Retinal detachment surgery; and Cholecystectomy. ? ? ?Admitted on 02/17/2022 with [name]  is a 76 y.o. female with a medical history significant for sickle cell disease, anemia of chronic disease, type 2 diabetes mellitus, chronic kidney disease stage IIIb, essential hypertension, and acquired hypothyroidism presents to the emergency department with complaints of pain to anterior chest wall, and right lower extremity that is consistent with her previous sickle cell crisis.  Patient states that pain intensity has been "up-and-down" over the past 4 days.  Patient was treated and evaluated in the emergency department on 02/18/2022 for this problem and felt as if she could return home.  However, pain returned shortly after arriving  home.  The patient has been taking her home oxycodone consistently without any sustained relief.  Patient reports that pain is worsening, prompting her to return to the emergency department.  She has not identified any palliative or provocative factors concerning crisis.  Patient currently denies any associated shortness of breath,  palpitations, nausea, vomiting, dizziness, diaphoresis, or presyncope.  She has had no recent travel or known exposure to COVID-19.  Patient has not had any recent falls or injuries.  She denies any radiating chest pains at this time.  Overall, pain is very similar to previous sickle cell pain crises.  Patient has been taking all prescribed medications consistently. ? ?Home opd bp ? - June 2022: 131/69 with neuro ?- dec 20022 149/52 with hand doc ?  ?ER course: Vital signs recorded as BP (!) 167/61 (BP Location: Left Arm) Comment: RN notified  Pulse (!) 57   Temp (!) 97.5 ?F (36.4 ?C) (Oral)   Resp 17   Wt 66.1 kg   SpO2 100%   BMI 25.81 kg/m?  ?Complete blood count shows WBCs 11.3, hemoglobin 11.2 and platelets 205,000.  Comprehensive metabolic panel shows glucose of 284, patient reports that she took her Lantus on last night, she did not check blood glucose prior to arrival. ?Also, CMP shows creatinine elevated at 1.65, BUN 25, and GFR 33.  Absolute reticulocytes 133.6.  Troponin negative.  Lipase is slightly elevated at 54.  CT of abdomen and pelvis shows no evidence of pulmonary embolism, no acute findings in the abdomen or pelvis, and aortic atherosclerosis.  CT angiogram shows no PE.  Patient's pain persists despite IV Dilaudid and IV fluids, patient thereby admitted for further work-up and evaluation of sickle cell pain crisis. ? ? ? ?HOSPITAL COURSE ? ?02/15/2022 - admit wth left shoulder and left arm pain c/w Sickle cell crisi. STarted dilaudid PCA . Alert and oriente din ER ?            - Neuro consult: DR Cheral Marker. EEG and MRI ordered. Metabolic encephalopathy suspected ?  ?02/21/22 - Rapid response MD: Called to see patient by floor coverage. Patient admitted 02/15/2022 for SS crisis 02/19/2022 and started on dilaudid. She has been unresponsive since mid day. Rapid response was called 2000 hrs: patient did not respond to 3 doses of Narcan, remaining obtunded. Lab revealed leukocytosis this AM of 19.1, up  from 11.5 at admission, AM lab revealed Cr 1.87 in patient with CKD 3, CXR revealed new R>L basilar Air space disease, ABG with Pco2 33, PO2 69, O2 sat 94.8. ? ?02/21/22 Rapid response RN: pt lying bed very  lethargic, with husband at her bedside.  Pt primary RN states pt did not look like this last night.  Pupils intact.  Unable to follow any commands.  Per protocol EKG, ABG, CBC, BMET orders placed.  Pt mouth full of old food.  Pt mouth suctioned and a lot of the food removed at least what could be seen.  Pt given narcan x 3 with not a lot of response.  Pt opened eyes, moaned but did not stay awake.  CT scan ordered pr MD.  MD at bedside to evaluate pt and placing orders. See flow sheet for VS. PCXR complete. ? ?02/21/22 -. Found to be sleepy afternoon rounds. Dilaudid PCA stopped. Noted on 2L [place]. Diet continued. Creat increased to 1.87mg %. -> later approx 8pm: needing 4 LNC and obtunded ->  ccm consult -> tx to sdu/ICU -> narcan gtt. [place] full of food ->  INTUBATED- (black vomit befe intubation). ?  ?02/22/22 -  still obutnded despite narcan gtt. Not on pressors. Not on vent. Sugars > 500. BO high 156-212sbp. 5 L [place]. Tachyepnic. Ammonia normal at 20 . Per RN - last night during rapid response there was lot of food in mouth. Concern for aspiration. LACTATE 9 ?            - Intubated  ?                        - Very hypertensive pre intubation - > had transient hypotension ?                        - Some coffee ground emesis and black emesisu pre intubation : Protonix gtt started ?            - RIJ CVL ?                        - transient hypotension during CVL ?            - Foley cath ?            - DKA protocol ? - ECHO : .  Echocardiogram LVEF 40 to 45% with global hypokinesis and mild pulmonary hypertension ?              ? ?March 16, 2022 early AM - reamins unresponsive. Has lost gag (had gag at time of intubation) and corneals (but has pupil). Significant vent dysnchrony and needed diprivan. Making urine. Not on  pressors . Noted: was severy hypertensive pre intubation (baseline at home - mild hypertension with 3 drugs) and then transient hypotension but otherwise normoteensive. Making urine. Not on pressors ?            - Lactate now a

## 2022-03-01 NOTE — Progress Notes (Signed)
Rapid EEG Nursing Documentation: ? ?EEG Headband in place? New Headband Applied Yes  ? ?Pre-EEG skin integrity:  IntactIntact ?Intact  ?Time EEG Headband placed:  March 04, 2022 1225  ? ?Intra & Post EEG Skin Integrity: Intact ? ?Seizure Burden (0-100%):  ?

## 2022-03-01 NOTE — Plan of Care (Signed)
?  Interdisciplinary Goals of Care Family Meeting ? ? ?Date carried out: 2022-03-19 ? ?Location of the meeting: Conference room ? ?Member's involved: Physician, Family Member or next of kin, and Other: hiusband, Sister, niece, grand niece, nephew ? ?Durable Power of Tour manager: husband   ? ?Discussion: We discussed goals of care for [name] .  - MODS, crtiically ill, Refractory shock. Death - likely mintures to hours to days. Husband and family joint discussion . Niece supportive of husband. Overall philosophy - they understand CPR will be medically ineffective but are people of strong faith and do not want to give up. Medical professionals to use all tools posssible to get her better. I supported their decision and we took shared decision making ? ?Code status: Full Code ? ?Disposition: Continue current acute care ? ?Time spent for the meeting: 15 ? ? ? ? ?SIGNATURE  ? ? ?Dr. Brand Males, M.D., F.C.C.P,  ?Pulmonary and Critical Care Medicine ?Staff Physician, [place] ?Center Director - Interstitial Lung Disease  Program  ?Pulmonary [facility] ?[place], [place], [address] ? ?NPI Number:  NPI #[id number] ?DEA Number: [id number] ? ?Pager: [phone], If no answer  -> Check AMION or Try [phone] ?Telephone (clinical office): [phone] ?Telephone (research): [phone] ? ?3:47 PM ?03/19/2022 ? ? ? ?

## 2022-03-01 NOTE — Progress Notes (Signed)
Patient transported to CT and back to 9432 without complication. ?

## 2022-03-01 NOTE — Progress Notes (Signed)
RT NOTE: ? ?2 RT's attempted to place A-line several times but was unsuccessful. MD and RN are aware.  ?

## 2022-03-01 NOTE — Progress Notes (Signed)
?   23-Mar-2022 0534  ?Provider Notification  ?Provider Name/Title Dr.Sommer  ?Date Provider Notified 2022-03-23  ?Time Provider Notified 0532  ?Notification Type Page  ?Notification Reason Critical result  ?Test performed and critical result Lactic Acid 5.5  ?Date Critical Result Received 2022/03/23  ?Time Critical Result Received 0532 ?(critcal result not called to the floor)  ?Provider response See new orders  ?Date of Provider Response Mar 23, 2022  ?Time of Provider Response [phone]  ? ?Also notified Dr. Oletta Darter that patient was no longer receiving IV fluids. New orders received.  ?

## 2022-03-01 NOTE — Consult Note (Signed)
? ?NAME:  [name], MRN:  [id number], DOB:  11/10/1946, LOS: 3 ?ADMISSION DATE:  02/19/2022, CONSULTATION DATE: February 21, 2022 ?REFERRING MD: Hospitalist, CHIEF COMPLAINT: AMS ? ?BRIEF  ?Patient is 76 year old African-American female past medical history of sickle cell disease was admitted with bone crisis Pain crisis sickle cell pain crisis she was admitted to the hospital her pain was in her bone mostly in the chest and low back hips she was started on Dilaudid drip later on she became unresponsive she had a stat CT which was negative she received 3 doses of Narcan 0.4 mg with no response I gave her 2 mg and she responded very well opened eyes looked at her husband and I called his name she is moving upper lower extremities without limitations.  Patient was on Dilaudid PCA oxycodone OxyContin ? ? ?Home opd bp ? - June 2022: 131/69 with neuro ?- dec 20022 149/52 with hand doc ? ?PAST   ? ? has a past medical history of Abdominal pain, Coronary artery disease, Diabetes mellitus without complication ([place]), Glaucoma, Hypertension, Hypothyroid, Renal insufficiency (09/01/2017), and Sickle cell anemia ([place]). ? ? has a past surgical history that includes Refractive surgery; Hip surgery; Retinal detachment surgery; and Cholecystectomy. ? ? ?EVENTS  ? ?02/19/2022 - admit wth left shoulder and left arm pain c/w Sickle cell crisi. STarted dilaudid PCA . Alert and oriente din ER ? - Neuro consult: DR Cheral Marker. EEG and MRI ordered. Metabolic encephalopathy suspected ? ?02/21/22 - DM nurse added semglee 10 units QHS. Found to be sleepy afternoon rounds. Dilaudid PCA stopped. Noted on 2L [place]. Diet continued. Creat increased to 1.87mg %. -> later approx 8pm: needing 4 LNC and obtunded ->  ccm consult -> tx to sdu/ICU -> narcan gtt. [place] full of food ? ?02/22/22 -  still obutnded despite narcan gtt. Not on pressors. Not on vent. Sugars > 500. BO high 156-212sbp. 5 L [place]. Tachyepnic. Ammonia normal at 20 . Per RN - last night  during rapid response there was lot of food in mouth. Concern for aspiration. LACTATE 9 ? - Intubated  ?  - Very hypertensive pre intubation - > had transient hypotension ?  - Some coffee ground emesis and black emesisu pre intubation : Protonix gtt started ? - RIJ CVL ?  - transient hypotension during CVL ? - Foley cath ? - DKA protocol ?   ? ? ?SUBJECTIVE/OVERNIGHT/INTERVAL HX  ? ?2022-03-17  - reamins unresponsive. Has lost gag (had gag at time of intubation) and corneals (but has pupil). Significant vent dysnchrony and needed diprivan. Making urine. Not on pressors . Noted: was severy hypertensive pre intubation (baseline at home - mild hypertension with 3 drugs) and then transient hypotension but otherwise normoteensive. Making urine. Not on pressors ? - Lactate now at 5 ? - BHOB - normal -> off insuling gtt 0> raising again ? - no active bleeding. Normal color stool. Remains on protonix gtt ? ? ?Objective   ?Blood pressure (!) 106/55, pulse (!) 113, temperature 99.6 ?F (37.6 ?C), temperature source Axillary, resp. rate (!) 34, height 5\' 3"  (1.6 m), weight 66.1 kg, SpO2 100 %. ?   ?Vent Mode: PRVC ?FiO2 (%):  [60 %-100 %] 60 % ?Set Rate:  [30 bmp] 30 bmp ?Vt Set:  [420 mL] 420 mL ?PEEP:  [5 cmH20] 5 cmH20 ?Plateau Pressure:  [17 cmH20-18 cmH20] 17 cmH20  ? ?Intake/Output Summary (Last 24 hours) at March 17, 2022 0736 ?Last data filed at March 17, 2022 0549 ?Gross per  24 hour  ?Intake 7377.95 ml  ?Output 2125 ml  ?Net 5252.95 ml  ? ?Filed Weights  ? 02/11/2022 1526 02/28/2022 1954  ?Weight: 66.1 kg 66.1 kg  ? ?General Appearance:  Looks criticall ill . On ventilator ?Head:  Normocephalic, without obvious abnormality, atraumatic ?Eyes:  PERRL - YES, conjunctiva/corneas - NO CORNEAL REFLEX     ?Ears:  Normal external ear canals, both ears ?Nose:  G tube - no ?Throat:  ETT TUBE - yes , OG tube - yes ?Neck:  Supple,  No enlargement/tenderness/nodules ?Lungs: Clear to auscultation bilaterally, Ventilator   Synchrony - yes but with  diprivan. 60% fio2 ?Heart:  S1 and S2 normal, no murmur, CVP - n.  Pressors - no ?Abdomen:  Soft, no masses, no organomegaly ?Genitalia / Rectal:  Not done ?Extremities:  Extremities- intact ?Skin:  ntact in exposed areas . Sacral area - not examined ?Neurologic:  Sedation - diprivan -> RASS - -4 . Moves all 4s - no. CAM-ICU - cannot assess . Orientation - none. [place]. GAG  - ABSET. PUPILLARY REFLEX  + ? ? ? ? ? ? ?Assessment & Plan:  ? ?Acute hypoxemic respiratory failure -with interval infiltrates onset 02/21/2022 -due to sickle cell and subsequently aspiration with obtunded encephalopathy-> s/e emergent intubatioin 02/22/22 ? ? ?08-Mar-2022 - > does not meet criteria for SBT/Extubation in setting of Acute Respiratory Failure due to ongoing coma ? ? ?P:   ?PRVC ?VAP bundle with oral care ? ? ? 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  Sepsis), DKA ? - normal ammonia  ? - CT Head 3/24 - negative ? - did not respond to narcan gtt 02/22/22 ?  ?08-Mar-2022 - deep coma continues despite improivement in renal output.  ? Toxic metabolic encephalopathy. ? Stroke (following low BP during intubation) ? ? ?P:  - d.w neuro Dr Theda Sers ?MRI brain without contrast (had order but was cancelled by primary service) ?Stat eeg (ceribell) ?Postintubation sedation with propofol ?RASS sedation score 0--2 [currently -5] ? ? ?Continue eye drops for glaucoma ? ? ? ?Hx of hypertension - Present on Admit  - on norvasc, aspirin, losartan,lopressor at home ? - home bp 130-145 sbp in various clinic ?Hx of Hypperlipidemia - Prior to & Present on Admit - on lipitor ? ?2022-03-08  Severe hypertension pre-intubation 02/22/22- >had transient post intubation hyptoension . Now normotensive ? ?P:  ?MAP Goal > 65  (baseline home sbp 131\-145) ? ? ? ? ECHO 02/22/22 - new onset s-CHF ef 45% - likely present at baseline and therefore Present on Admit (no prior baesline echo) ? ?03-08-2022 - normotensive, trop rising 1200. No anticoagulation due to coffee ground emesis  02/22/22 during eintubation ? ?P: ?Check EKG ?Check stat scvo2 ?Might need cards consult ? - doubt can intervene right now. ?- will wait for MRI results befeore calling cards ? ? ? ?Sinus tachycardia ? ? ?P: ?Monitor ? ?   ?High concern for aspiration pnemonia 02/21/2022 [seen with food in the mouth] and also vomit immediate pre-intubation ? - PCT c/w sepsis  ? -- Urine strep - neg ?  - rvp  -neg ? ?P:   ?Await blood culture 03/08/2022 ?Await Legionella ?Continue ceftriaxone ?Continue Flagyl ? ?  ?CKD stage 3 B - Baseline creat  approx 1.3 - 1.5mg % Prior to & Present on Admit ?AKI present on admission: - 1.60 mg percent. Peak 2.5 02/22/22 ? ?3/26 - creat imrpvoed to 1.9mg %. Making urine ? ?P:  ?Continue Foley catheter ?Fluids ?No NSAIDs ? ?METABOLIC ACIDOSIS - Lactate -  >9 on 02/22/22 and DKA with elevated BHOB ? ?2022/03/11 - lacate improved to 5s but no futher clearance. BHOB normalized but elevated again. Abd soft ? ?Plan ?- - ICU hyperglucemiua protocol - restart insulin gtt ?- rule out pancreatitis and rhabdo ? ? ? ? ?Mild hyponatremia present 02/22/2022 - and corrected ?Mild hypomagnesemia March 11, 2022 ? ?P: ?Replete mag and Monitor ? ? ?   ?Possible likely UGI bleed - black vomit pre intubation 02/22/22 ? ?11-Mar-2022  - no active bleed. No melena ? ?P:   ?N.p.o. ?PPI gtt ?Start TF ? ? ? ?Sickle cell disease with a baseline hemoglobin 11 g% in January 2023 and 8.5 g% in June 2022 ? -Admission hemoglobin of 9.7 g% ? ?Mar 11, 2022: No active bleeding but did have black vomit 23/25/23. Hgb 7.7gm% ? ?P:  ?- PRBC for hgb </= 6.9gm%   ? - exceptions are ?  -  if ACS susepcted/confirmed then transfuse for hgb </= 8.0gm%,  or  ?  -  active bleeding with hemodynamic instability, then  transfuse regardless of hemoglobin value ?  At at all times try to transfuse 1 unit prbc as possible with exception of active hemorrhage ? ? ? ? ?Normal platelets baseline.  New onset thrombocytopenia 02/21/2022 (

## 2022-03-01 NOTE — Progress Notes (Signed)
Followed up with Dr. Oletta Darter in regards to patient's RASS of -5 and unresponsiveness. Patient continues to remain unresponsive to pain and stimulation at this time. Propofol remains off. Narcan ordered by Dr. Oletta Darter. Also reported patient's Hgb of 7.7 and PLT count of 56.  ?

## 2022-03-01 NOTE — Progress Notes (Signed)
?  Mar 18, 2022 0000  ?Provider Notification  ?Provider Name/Title Dr.Sommer  ?Date Provider Notified 2022-03-18  ?Time Provider Notified [phone]  ?Notification Type Page  ?Notification Reason Other (Comment) ?(Patient has met criteria to transistion to phase 3 and transistion off of endo-tool. Notified provider.)  ?Provider response See new orders  ?Date of Provider Response 2022-03-18  ?Time of Provider Response 0031  ? ?Spoke with Dr. Oletta Darter regarding patient's CBG and insulin gtt. Patient met criteria to be transitioned to phase 3 and transition off of insulin infusion. Also notified Dr. Oletta Darter that patient has been unresponsive with a RASS of -5 and GCS of 3. Propofol infusion has been weaned and was completely turned off around 0015. Order received to notify if patient is not more alert in one hour.  ?

## 2022-03-01 NOTE — ED Provider Notes (Signed)
I responded to in-hospital CODE BLUE alert for this patient.  We immediately presented to the patient's room with nursing staff and equipment.  However upon arrival pulmonary critical care team had initiated CPR already.  Did not require any additional assistance from ER team. ?  ?Luna Fuse, MD ?Mar 24, 2022 1738 ? ?

## 2022-03-01 NOTE — Progress Notes (Signed)
Pt HR dropping to 50s. BP dropping rapidly. Maxed out on Levo & Neo drips. Pt agonal breathing. Pt rectal temp 92.1 rectal. 1 unit of RBC started. Pt went PEA & code blue called.  ?

## 2022-03-01 NOTE — Progress Notes (Addendum)
eLink Physician-Brief Progress Note ?Patient Name: Captola Teschner Flett ?DOB: Jan 08, 1946 ?MRN: [id number] ? ? ?Date of Service ? 2022-03-21  ?HPI/Events of Note ? Lactic Acid = >9.0 --> 5.7 --> 5.5. LVEF = 40-45%.  ?eICU Interventions ? Plan: ?Bolus with 0.9 NaCl 500 mL IV over 1 hour now. ?Repeat Lactic Acid at 12 noon.   ? ? ? ?Intervention Category ?Major Interventions: Acid-Base disturbance - evaluation and management ? ?Pryce Folts Cornelia Copa ?03-21-22, 5:35 AM ?

## 2022-03-01 NOTE — Progress Notes (Signed)
Notified Dr. Chase Caller of patient's lactic acid of 5.5 this morning. Notified provider that a bolus was administered per order. Propofol was weaned off overnight but this morning around 0545-0600 patient became dyssynchronous with the vent and is currently breathing 35-38 breaths per min. Respiratory therapy notified. Patient re- positioned and suctioned. Patient remained tachypneic. Versed administered x1 and Propofol infusion re-started. Dr. Chase Caller rounding at this time. No new orders received at this time.  ?

## 2022-03-01 NOTE — Progress Notes (Signed)
Critical Results  ?0750- Troponin 1200 ?0934- total hgb <6.9 ?0953- platelets 26, lactic >9.0  ?1006- pH 7.19 ?1503- troponin 1990 ? ?MD aware of labs in a timely manner. See new Orders.  ? ? ?

## 2022-03-01 NOTE — Procedures (Addendum)
OF NOTE: report delayed as physician not notified of study being done at time of hookup or thereafter. Team notified ? ? ?Patient Name: [name]  ?MRN: [id number]  ?Epilepsy Attending: Lora Havens  ?Referring Physician/Provider: Dr Brand Males ?Duration: 03-Mar-2022 1216 to 1516 ? ?Patient history: 76yo F with ams. EEG to evaluate for seizure ? ?Level of alertness: comatose ? ?AEDs during EEG study: None ? ?Technical aspects: This EEG was obtained using a 10 lead EEG system positioned circumferentially without any parasagittal coverage (rapid EEG). Computer selected EEG is reviewed as  well as background features and all clinically significant events. ? ? ?Description: EEG showed continuous generalized  3 to 6 Hz theta-delta slowing. Hyperventilation and photic stimulation were not performed.    ? ?ABNORMALITY ?- Continuous slow, generalized ? ?IMPRESSION: ?This limited ceribell EEG is study is suggestive of moderate to severe diffuse encephalopathy, nonspecific etiology. No seizures or epileptiform discharges were seen throughout the recording. ? ?Lora Havens  ? ?

## 2022-03-01 NOTE — Progress Notes (Signed)
eLink Physician-Brief Progress Note ?Patient Name: [name] ?DOB: 07-Sep-1946 ?MRN: [id number] ? ? ?Date of Service ? 03-11-2022  ?HPI/Events of Note ? Nursing concerned about the patient's unresponsiveness. However, the patient's current mental status is c/w the exam of Dr. Cheral Marker which revealed an obtunded patient with sonorous respirations who does not follow any commands or localize to pain and was felt to be due to Dilaudid PCA pump use for sickle cell crisis. Head CT was negative on 01/24/2022.  ?eICU Interventions ? Plan: ?Narcan PRN. ?Await MRI of Head when able.   ? ? ? ?Intervention Category ?Major Interventions: Change in mental status - evaluation and management ? ?Stanton Kissoon Cornelia Copa ?11-Mar-2022, 2:22 AM ?

## 2022-03-01 NOTE — Progress Notes (Addendum)
?  CCM update ? ?Called husband due to declining numbers. Sister answered phone. Explained numbers are worse and expressed she might not survive next 24h or days. Advised after church family should visit patient ? ?Lactte worse 9 ?Plat worse 26 ?Trop worse 1600 ? ?Will chck abg and consider bic gtt ?Will orer prbc ?Awaiting heme review of smear ?Cards consult called - d/w Dr Margaretann Loveless ? ? ?Additonal 15 min ? ?SIGNATURE  ? ? ?Dr. Brand Males, M.D., F.C.C.P,  ?Pulmonary and Critical Care Medicine ?Staff Physician, [place] ?Center Director - Interstitial Lung Disease  Program  ?[facility] ?[place], [place], [address] ? ?NPI Number:  NPI #[id number] ?DEA Number: [id number] ? ?Pager: [phone], If no answer  -> Check AMION or Try [phone] ?Telephone (clinical office): [phone] ?Telephone (research): [phone] ? ?10:18 AM ?03/07/22 ? ?

## 2022-03-01 NOTE — Progress Notes (Signed)
eLink Physician-Brief Progress Note ?Patient Name: Kamron Portee Urbanek ?DOB: Jul 19, 1946 ?MRN: [id number] ? ? ?Date of Service ? 02/26/22  ?HPI/Events of Note ? Hyperglycemia - EndoTool requests transition to Levemir + Novolog SSI. Blood glucose = 169. Present on an insulin IV infusion at 3.2 units/hour.    ?eICU Interventions ? Will transition to Levemir 15 units Q 12 hours + Q 4 hour sensitive Novolog SSI.  ? ? ? ?Intervention Category ?Major Interventions: Hyperglycemia - active titration of insulin therapy ? ?Dalten Ambrosino Cornelia Copa ?26-Feb-2022, 12:35 AM ?

## 2022-03-01 NOTE — Consult Note (Signed)
? ?CONSULTATION NOTE  ? ?Patient Name: [name] ?Date of Encounter: 2022-03-07 ?Cardiologist: None ?Electrophysiologist: None ?Advanced Heart Failure: None ? ? ?Chief Complaint  ? ?Intubated, sedated on vent ? ?Patient Profile  ? ?76 yo female with history of sickle cell disease admitted with pain crisis and who became obtunded thought initially due to excess narcotics, improved with Narcan but ultimately developed hypotension and unresponsiveness.  Echo performed showed reduced LVEF at 41% with elevated troponin, cardiology is asked to consult for management recommendations. ? ?HPI  ? ?[name] is a 76 y.o. female who is being seen today for the evaluation of CHF/elevated troponin at the request of Dr. Chase Caller.  This is a 76 year old female with no known cardiac history.  She has a history of sickle cell disease and was admitted with a pain crisis.  She was on a Dilaudid drip but became unresponsive and improved with Narcan.  Subsequently she became progressively unresponsive and required intubation for airway protection.  She had a notably high lactate of 5 and has become progressively hypotensive.  Echocardiogram was performed yesterday which I personally reviewed and showed LVEF 40 to 45% with global hypokinesis and mild pulmonary hypertension.  Labs are significantly abnormal with a low pH of 7.19 and bicarb of 13.  Troponin elevated at 1216 102.  GFR 27, marked elevation in AST and ALT and amylase with a venous lactate greater than 9.  Hemoglobin 7.7 yesterday.  INR 1.7.  Overall labs consistent with multiorgan system failure and probable DIC. ? ?PMHx  ? ?Past Medical History:  ?Diagnosis Date  ? Abdominal pain   ? Coronary artery disease   ? Diabetes mellitus without complication ([place])   ? Glaucoma   ? Hypertension   ? Hypothyroid   ? Renal insufficiency 09/01/2017  ? Sickle cell anemia ([facility])   ? ? ?Past Surgical History:  ?Procedure Laterality Date  ? CHOLECYSTECTOMY    ? HIP SURGERY    ?  6 yrs ago  ? REFRACTIVE SURGERY    ? RETINAL DETACHMENT SURGERY    ? ? ?FAMHx  ? ?Family History  ?Problem Relation Age of Onset  ? Diabetes Mother   ? Cancer Father   ?     Prostate  ? Diabetes Father   ? Cancer Brother   ? Diabetes Brother   ? Breast cancer Maternal Aunt   ?     does not know age  ? ? ?SOCHx  ? ? reports that she has quit smoking. She has never used smokeless tobacco. She reports that she does not drink alcohol and does not use drugs. ? ?Outpatient Medications  ? ?No current facility-administered medications on file prior to encounter.  ? ?Current Outpatient Medications on File Prior to Encounter  ?Medication Sig Dispense Refill  ? acetaminophen (TYLENOL) 500 MG tablet Take 1,000 mg by mouth every 6 (six) hours as needed for mild pain or moderate pain.     ? amLODipine (NORVASC) 10 MG tablet Take 10 mg by mouth daily.    ? aspirin EC 81 MG tablet Take 81 mg by mouth daily.    ? atorvastatin (LIPITOR) 40 MG tablet Take 40 mg by mouth at bedtime.     ? Cholecalciferol (VITAMIN D3) 1.25 MG (50000 UT) CAPS Take 50,000 capsules by mouth once a week.    ? ferrous sulfate 325 (65 FE) MG tablet Take 325 mg by mouth daily with breakfast.    ? folic acid (FOLVITE) 1 MG  tablet Take 1 mg by mouth daily.    ? glipiZIDE (GLUCOTROL XL) 5 MG 24 hr tablet Take 5 mg by mouth 3 (three) times a week. Mon, Wed, Fri    ? insulin glargine (LANTUS) 100 UNIT/ML injection Inject 0.18 mLs (18 Units total) into the skin at bedtime. 10 mL 11  ? levothyroxine (SYNTHROID) 125 MCG tablet Take 125 mcg by mouth daily.    ? Lifitegrast (XIIDRA) 5 % SOLN Place 1 drop into both eyes 2 (two) times daily.    ? losartan (COZAAR) 100 MG tablet Take 0.5 tablets (50 mg total) by mouth daily. (Patient taking differently: Take 100 mg by mouth daily.) 30 tablet 0  ? LUMIGAN 0.01 % SOLN Place 1 drop into both eyes at bedtime. Separate by at least 10 minutes from other intraocular pressure reducing ophthalmic drugs    ? meclizine (ANTIVERT) 25  MG tablet Take 25 mg by mouth 3 (three) times daily as needed for dizziness.    ? metoprolol succinate (TOPROL-XL) 50 MG 24 hr tablet Take 50 mg by mouth at bedtime. Take with or immediately following a meal.    ? RHOPRESSA 0.02 % SOLN Place 1 drop into the left eye at bedtime.    ? vitamin C (ASCORBIC ACID) 500 MG tablet Take 500 mg by mouth daily.    ? cycloSPORINE (RESTASIS) 0.05 % ophthalmic emulsion Place 1 drop into both eyes 2 (two) times daily. (Patient not taking: Reported on 02/19/2022)    ? levothyroxine (SYNTHROID) 175 MCG tablet Take 175 mcg by mouth See admin instructions. Taking daily except Sat & Wed (Patient not taking: Reported on 02/03/2022)    ? Oxycodone HCl 10 MG TABS Take 0.5 tablets (5 mg total) by mouth every 6 (six) hours as needed. (Patient not taking: Reported on 02/17/2022) 30 tablet 0  ? ? ?Inpatient Medications  ?  ?Scheduled Meds: ? sodium chloride   Intravenous Once  ? vitamin C  500 mg Per Tube Daily  ? chlorhexidine gluconate (MEDLINE KIT)  15 mL Mouth Rinse BID  ? Chlorhexidine Gluconate Cloth  6 each Topical Daily  ? cycloSPORINE  1 drop Both Eyes BID  ? docusate  100 mg Per Tube BID  ? [START ON 02/24/2022] ferrous sulfate  220 mg Per Tube Q breakfast  ? folic acid  1 mg Per Tube Daily  ? latanoprost  1 drop Both Eyes QHS  ? [START ON 02/24/2022] levothyroxine  175 mcg Per Tube Q0600  ? mouth rinse  15 mL Mouth Rinse 10 times per day  ? Netarsudil Dimesylate  1 drop Left Eye QHS  ? [START ON 02/25/2022] pantoprazole  40 mg Intravenous Q12H  ? phenylephrine      ? phenylephrine      ? polyethylene glycol  17 g Per Tube Daily  ? senna-docusate  1 tablet Per Tube BID  ? ? ?Continuous Infusions: ? cefTRIAXone (ROCEPHIN)  IV Stopped (02-28-2022 0047)  ? dextrose 5% lactated ringers 10 mL/hr at 28-Feb-2022 0944  ? insulin 3.8 Units/hr (28-Feb-2022 0944)  ? metronidazole 500 mg (28-Feb-2022 1134)  ? norepinephrine (LEVOPHED) Adult infusion 2 mcg/min (02/28/2022 1143)  ? pantoprazole 8 mg/hr (28-Feb-2022  0944)  ? propofol (DIPRIVAN) infusion Stopped (02/28/22 0809)  ? sodium bicarbonate 150 mEq in D5W infusion 100 mL/hr at 02-28-2022 1145  ? ? ?PRN Meds: ?dextrose, fentaNYL (SUBLIMAZE) injection, fentaNYL (SUBLIMAZE) injection, midazolam, midazolam, naLOXone (NARCAN)  injection, polyethylene glycol  ? ?ALLERGIES  ? ?Allergies  ?Allergen  Reactions  ? Bee Venom Swelling  ? Nsaids Other (See Comments)  ?  Dyspepsia. Advised by Primary Provider to avoid because of Kidneys  ? ? ?ROS  ? ?Review of systems not obtained due to patient factors. ? ?Vitals  ? ?Vitals:  ? 2022-02-24 0800 02-24-22 0837 2022/02/24 0900 24-Feb-2022 1138  ?BP: 106/76 124/78 (!) 131/53 (!) 138/53  ?Pulse: (!) 108  (!) 107 (!) 111  ?Resp: 17  (!) 30   ?Temp: 98.5 ?F (36.9 ?C)     ?TempSrc: Axillary     ?SpO2: 100%  99% 95%  ?Weight:      ?Height:      ? ? ?Intake/Output Summary (Last 24 hours) at Feb 24, 2022 1245 ?Last data filed at Feb 24, 2022 0944 ?Gross per 24 hour  ?Intake 5674.72 ml  ?Output 1095 ml  ?Net 4579.72 ml  ? ?Filed Weights  ? 02/15/2022 1526 02/06/2022 1954  ?Weight: 66.1 kg 66.1 kg  ? ? ?Physical Exam  ? ?General appearance: intubated, sedated on vent ?Neck: no carotid bruit, no JVD, and thyroid not enlarged, symmetric, no tenderness/mass/nodules ?Lungs: diminished breath sounds bibasilar ?Heart: regular rate and rhythm ?Abdomen: soft, non-tender; bowel sounds normal; no masses,  no organomegaly ?Extremities: extremities normal, atraumatic, no cyanosis or edema ?Pulses: weak distal pulses ?Skin: cool, dry ?Neurologic: Mental status: intubated, sedated on vent ?Psych: Cannot assess ? ?Labs  ? ?Results for orders placed or performed during the hospital encounter of 02/06/2022 (from the past 48 hour(s))  ?Glucose, capillary     Status: Abnormal  ? Collection Time: 02/21/22  4:26 PM  ?Result Value Ref Range  ? Glucose-Capillary 141 (H) 70 - 99 mg/dL  ?  Comment: Glucose reference range applies only to samples taken after fasting for at least 8 hours.   ?Glucose, capillary     Status: Abnormal  ? Collection Time: 02/21/22  8:13 PM  ?Result Value Ref Range  ? Glucose-Capillary 196 (H) 70 - 99 mg/dL  ?  Comment: Glucose reference range applies only to samples

## 2022-03-01 NOTE — Progress Notes (Addendum)
Pt was brought down for MRI. Pt moved to MRI bed. Pts HR dropping down to 30s-40s. Pt pulses more faint. Pt looks more pale in color. Dr. Chase Caller notified. 2 amps of Bicarb given. 10 mL of epi given to stabilize  pt.. Pt brought back to pt's room. MD came in room to discuss next steps. Pt stable at this time ? ?

## 2022-03-01 NOTE — Progress Notes (Addendum)
? ?  BP/HR drop in MRI ? ?Plan ? - MRI cancelled for the moment ?- bic bolus + epi bolus ? - start bic gtt, start levophed gtt ? - await abg resut ?- place aline ?- will try to get CT head ?- needs ceribell ? ?15 min additonal ccm time ? ? ? ?SIGNATURE  ? ? ?Dr. Brand Males, M.D., F.C.C.P,  ?Pulmonary and Critical Care Medicine ?Staff Physician, [place] ?Center Director - Interstitial Lung Disease  Program  ?[facility] ?[place], [place], [address] ? ?NPI Number:  NPI #[id number] ?DEA Number: [id number] ? ?Pager: [phone], If no answer  -> Check AMION or Try [phone] ?Telephone (clinical office): [phone] ?Telephone (research): [phone] ? ?11:47 AM ?03-01-2022 ? ?

## 2022-03-01 NOTE — Progress Notes (Signed)
? ?[facility]  ?[phone] [phone]) [phone]  ? ?MEDICAL ONCOLOGY - INITIAL CONSULTATION ? ?Referral MD ? ?Reason for Referral: Thrombocytopenia ?Time of consult : 3:30 PM ? ?Chief Complaint  ?Patient presents with  ? Sickle Cell Pain Crisis  ? ? ?HPI:  ? ?This is 76 yo female patient with CAD/DM/Sickle cell disease who initially was admitted with pain crisis, then discharged but continued to have left shoulder and rib pain, hence came back to the hospital, then became obtunded initially thought to be secondary to narcotic overdose but then continued to deteriorate requiring pressors, ventilator support. Hematology was consulted today given thrombocytopenia. ? ?I saw her this afternoon and discussed with her husband, sister and niece. ? ?She had no evidence of bleeding from any lines. Urine is clear. She was not responsive to pain according to what the nurse said.  ? ?ROS otherwise unobtainable. ? ?Past Medical History:  ?Diagnosis Date  ? Abdominal pain   ? Coronary artery disease   ? Diabetes mellitus without complication ([place])   ? Glaucoma   ? Hypertension   ? Hypothyroid   ? Renal insufficiency 09/01/2017  ? Sickle cell anemia (HCC)   ?: ? ? ?Past Surgical History:  ?Procedure Laterality Date  ? CHOLECYSTECTOMY    ? HIP SURGERY    ? 6 yrs ago  ? REFRACTIVE SURGERY    ? RETINAL DETACHMENT SURGERY    ?: ? ? ?Current Facility-Administered Medications  ?Medication Dose Route Frequency Provider Last Rate Last Admin  ? ascorbic acid (VITAMIN C) tablet 500 mg  500 mg Per Tube Daily Brand Males, MD   500 mg at 2022-03-01 0943  ? cefTRIAXone (ROCEPHIN) 1 g in sodium chloride 0.9 % 100 mL IVPB  1 g Intravenous Q24H Norins, Heinz Knuckles, MD   Stopped at 01-Mar-2022 0047  ? chlorhexidine gluconate (MEDLINE KIT) (PERIDEX) 0.12 % solution 15 mL  15 mL Mouth Rinse BID Brand Males, MD   15 mL at March 01, 2022 0741  ? Chlorhexidine Gluconate Cloth 2 % PADS 6 each  6 each Topical Daily Norins, Heinz Knuckles, MD   6 each at 02/22/22 2233  ? cycloSPORINE (RESTASIS) 0.05 % ophthalmic emulsion 1 drop  1 drop Both Eyes BID Dorena Dew, FNP   1 drop at 03/01/2022 9728  ? dextrose 5 % in lactated ringers infusion   Intravenous Continuous Brand Males, MD 10 mL/hr at 03/01/2022 1817 Infusion Verify at 2022/03/01 1817  ? dextrose 50 % solution 0-50 mL  0-50 mL Intravenous PRN Brand Males, MD      ? docusate (COLACE) 50 MG/5ML liquid 100 mg  100 mg Per Tube BID Brand Males, MD   100 mg at 02/22/22 2234  ? fentaNYL (SUBLIMAZE) injection 25 mcg  25 mcg Intravenous Q15 min PRN Brand Males, MD      ? fentaNYL (SUBLIMAZE) injection 25-100 mcg  25-100 mcg Intravenous Q30 min PRN Brand Males, MD   100 mcg at 02/22/22 1143  ? [START ON 02/24/2022] ferrous sulfate 220 (44 Fe) MG/5ML solution 220 mg  220 mg Per Tube Q breakfast Brand Males, MD      ? folic acid (FOLVITE) tablet 1 mg  1 mg Per Tube Daily Brand Males, MD   1 mg at March 01, 2022 0943  ? insulin regular, human (MYXREDLIN) 100 units/ 100 mL infusion   Intravenous Continuous Brand Males, MD   Stopped at 03/01/22 1743  ? latanoprost (XALATAN) 0.005 % ophthalmic solution 1 drop  1 drop Both Eyes QHS Dorena Dew, FNP   1 drop at 02/22/22 2235  ? [START ON 02/24/2022] levothyroxine (SYNTHROID) tablet 175 mcg  175 mcg Per Tube [id number] Brand Males, MD      ? MEDLINE mouth rinse  15 mL Mouth Rinse 10 times per day Brand Males, MD   15 mL at 02/25/2022 1651  ? methylPREDNISolone sodium succinate (SOLU-MEDROL) 125 mg/2 mL injection 125 mg  125 mg Intravenous Q8H Loney Domingo, MD      ? metroNIDAZOLE (FLAGYL) IVPB 500 mg  500 mg Intravenous Q12H Norins, Heinz Knuckles, MD   Stopped at 2022-02-25 1234  ? midazolam (VERSED) injection 2 mg  2 mg Intravenous Q15 min PRN Brand Males, MD   2 mg at 02/22/22 1142  ? midazolam (VERSED) injection 2 mg  2 mg Intravenous Q2H PRN Brand Males, MD   2 mg at February 25, 2022 0548  ? naloxone  [facility]) injection 0.4 mg  0.4 mg Intravenous PRN Anders Simmonds, MD   0.4 mg at 2022/02/25 0225  ? Netarsudil Dimesylate 0.02 % SOLN 1 drop  1 drop Left Eye QHS Dorena Dew, FNP   1 drop at 02/19/2022 2135  ? norepinephrine (LEVOPHED) 16 mg in 288m premix infusion  0-40 mcg/min Intravenous Titrated RBrand Males MD   Stopped at 0Mar 28, 20231743  ? [START ON 02/25/2022] pantoprazole (PROTONIX) injection 40 mg  40 mg Intravenous Q12H RBrand Males MD      ? pantoprozole (PROTONIX) 80 mg /NS 100 mL infusion  8 mg/hr Intravenous Continuous RBrand Males MD   Stopped at 0March 28, 20231743  ? phenylephrine (NEO-SYNEPHRINE) 263mNS 25060mremix infusion  0-400 mcg/min Intravenous Titrated RamBrand MalesD   Stopped at 02/01/27/202343  ? phenylephrine 0.4-0.9 MG/10ML-% injection           ? phenylephrine 0.4-0.9 MG/10ML-% injection           ? polyethylene glycol (MIRALAX / GLYCOLAX) packet 17 g  17 g Per Tube Daily RamBrand MalesD      ? polyethylene glycol (MIRALAX / GLYCOLAX) packet 17 g  17 g Per Tube Daily PRN RamBrand MalesD      ? propofol (DIPRIVAN) 1000 MG/100ML infusion  0-50 mcg/kg/min Intravenous Continuous RamBrand MalesD   Stopped at 03/03-28-2321  ? senna-docusate (Senokot-S) tablet 1 tablet  1 tablet Per Tube BID RamBrand MalesD      ? sodium bicarbonate 150 mEq in dextrose 5 % 1,150 mL infusion   Intravenous Continuous RamBrand MalesD   Stopped at 02/01/27/2344  ? vasopressin (PITRESSIN) 20 Units in sodium chloride 0.9 % 100 mL infusion-*FOR SHOCK*  0-0.03 Units/min Intravenous Continuous RamBrand MalesD   Stopped at 03/03-28-2343  ? ?Current Outpatient Medications  ?Medication Sig Dispense Refill  ? acetaminophen (TYLENOL) 500 MG tablet Take 1,000 mg by mouth every 6 (six) hours as needed for mild pain or moderate pain.     ? amLODipine (NORVASC) 10 MG tablet Take 10 mg by mouth daily.    ? aspirin EC 81 MG tablet Take 81 mg by mouth daily.    ?  atorvastatin (LIPITOR) 40 MG tablet Take 40 mg by mouth at bedtime.     ? Cholecalciferol (VITAMIN D3) 1.25 MG (50000 UT) CAPS Take 50,000 capsules by mouth once a week.    ? ferrous sulfate 325 (65 FE) MG tablet Take 325 mg by mouth daily with breakfast.    ? folic acid (FOLVITE)  1 MG tablet Take 1 mg by mouth daily.    ? glipiZIDE (GLUCOTROL XL) 5 MG 24 hr tablet Take 5 mg by mouth 3 (three) times a week. Mon, Wed, Fri    ? insulin glargine (LANTUS) 100 UNIT/ML injection Inject 0.18 mLs (18 Units total) into the skin at bedtime. 10 mL 11  ? levothyroxine (SYNTHROID) 125 MCG tablet Take 125 mcg by mouth daily.    ? Lifitegrast (XIIDRA) 5 % SOLN Place 1 drop into both eyes 2 (two) times daily.    ? losartan (COZAAR) 100 MG tablet Take 0.5 tablets (50 mg total) by mouth daily. (Patient taking differently: Take 100 mg by mouth daily.) 30 tablet 0  ? LUMIGAN 0.01 % SOLN Place 1 drop into both eyes at bedtime. Separate by at least 10 minutes from other intraocular pressure reducing ophthalmic drugs    ? meclizine (ANTIVERT) 25 MG tablet Take 25 mg by mouth 3 (three) times daily as needed for dizziness.    ? metoprolol succinate (TOPROL-XL) 50 MG 24 hr tablet Take 50 mg by mouth at bedtime. Take with or immediately following a meal.    ? RHOPRESSA 0.02 % SOLN Place 1 drop into the left eye at bedtime.    ? vitamin C (ASCORBIC ACID) 500 MG tablet Take 500 mg by mouth daily.    ? cycloSPORINE (RESTASIS) 0.05 % ophthalmic emulsion Place 1 drop into both eyes 2 (two) times daily. (Patient not taking: Reported on 02/11/2022)    ? levothyroxine (SYNTHROID) 175 MCG tablet Take 175 mcg by mouth See admin instructions. Taking daily except Sat & Wed (Patient not taking: Reported on 02/01/2022)    ? Oxycodone HCl 10 MG TABS Take 0.5 tablets (5 mg total) by mouth every 6 (six) hours as needed. (Patient not taking: Reported on 01/30/2022) 30 tablet 0  ? ? ?Allergies  ?Allergen Reactions  ? Bee Venom Swelling  ? Nsaids Other (See  Comments)  ?  Dyspepsia. Advised by Primary Provider to avoid because of Kidneys  ?: ? ? ?Family History  ?Problem Relation Age of Onset  ? Diabetes Mother   ? Cancer Father   ?     Prostate  ? Diabetes Father   ? Cancer B

## 2022-03-01 NOTE — Progress Notes (Addendum)
Dr. Chase Caller present in pt's room. Was told pt unresponsive to painful stimuli. Pupils sluggish to light. Pt does not have gag reflex when brushing teeth. Dr. Chase Caller flushed eyes out with saline with no reaction, even after propofol was turned off.  ?

## 2022-03-01 DEATH — deceased

## 2022-03-28 DIAGNOSIS — R4182 Altered mental status, unspecified: Secondary | ICD-10-CM | POA: Insufficient documentation

## 2022-05-14 ENCOUNTER — Encounter (INDEPENDENT_AMBULATORY_CARE_PROVIDER_SITE_OTHER): Payer: 59 | Admitting: Ophthalmology
